# Patient Record
Sex: Female | Born: 1935 | Race: Black or African American | Hispanic: No | Smoking: Never smoker
Health system: Southern US, Community
[De-identification: ages and names within clinical notes are randomized; demographics above are authoritative.]

## PROBLEM LIST (undated history)

## (undated) DIAGNOSIS — Z87442 Personal history of urinary calculi: Secondary | ICD-10-CM

## (undated) DIAGNOSIS — I4891 Unspecified atrial fibrillation: Secondary | ICD-10-CM

## (undated) DIAGNOSIS — R011 Cardiac murmur, unspecified: Secondary | ICD-10-CM

## (undated) DIAGNOSIS — B019 Varicella without complication: Secondary | ICD-10-CM

## (undated) DIAGNOSIS — K7689 Other specified diseases of liver: Secondary | ICD-10-CM

## (undated) DIAGNOSIS — E669 Obesity, unspecified: Secondary | ICD-10-CM

## (undated) DIAGNOSIS — I2510 Atherosclerotic heart disease of native coronary artery without angina pectoris: Secondary | ICD-10-CM

## (undated) DIAGNOSIS — I10 Essential (primary) hypertension: Secondary | ICD-10-CM

## (undated) DIAGNOSIS — K5790 Diverticulosis of intestine, part unspecified, without perforation or abscess without bleeding: Secondary | ICD-10-CM

## (undated) DIAGNOSIS — G4733 Obstructive sleep apnea (adult) (pediatric): Secondary | ICD-10-CM

## (undated) DIAGNOSIS — Z860100 Personal history of colon polyps, unspecified: Secondary | ICD-10-CM

## (undated) DIAGNOSIS — N200 Calculus of kidney: Secondary | ICD-10-CM

## (undated) DIAGNOSIS — Z972 Presence of dental prosthetic device (complete) (partial): Secondary | ICD-10-CM

## (undated) DIAGNOSIS — D259 Leiomyoma of uterus, unspecified: Secondary | ICD-10-CM

## (undated) DIAGNOSIS — F1020 Alcohol dependence, uncomplicated: Secondary | ICD-10-CM

## (undated) DIAGNOSIS — N907 Vulvar cyst: Secondary | ICD-10-CM

## (undated) DIAGNOSIS — B029 Zoster without complications: Secondary | ICD-10-CM

## (undated) DIAGNOSIS — D219 Benign neoplasm of connective and other soft tissue, unspecified: Secondary | ICD-10-CM

## (undated) DIAGNOSIS — D649 Anemia, unspecified: Secondary | ICD-10-CM

## (undated) DIAGNOSIS — K429 Umbilical hernia without obstruction or gangrene: Secondary | ICD-10-CM

## (undated) DIAGNOSIS — M1990 Unspecified osteoarthritis, unspecified site: Secondary | ICD-10-CM

## (undated) DIAGNOSIS — N950 Postmenopausal bleeding: Secondary | ICD-10-CM

## (undated) DIAGNOSIS — R7301 Impaired fasting glucose: Secondary | ICD-10-CM

## (undated) DIAGNOSIS — N281 Cyst of kidney, acquired: Secondary | ICD-10-CM

## (undated) DIAGNOSIS — Z9989 Dependence on other enabling machines and devices: Secondary | ICD-10-CM

## (undated) DIAGNOSIS — K635 Polyp of colon: Secondary | ICD-10-CM

## (undated) DIAGNOSIS — E785 Hyperlipidemia, unspecified: Secondary | ICD-10-CM

## (undated) HISTORY — DX: Calculus of kidney: N20.0

## (undated) HISTORY — PX: CARDIAC CATHETERIZATION: SHX172

## (undated) HISTORY — DX: Diverticulosis of intestine, part unspecified, without perforation or abscess without bleeding: K57.90

## (undated) HISTORY — PX: OTHER SURGICAL HISTORY: SHX169

## (undated) HISTORY — DX: Atherosclerotic heart disease of native coronary artery without angina pectoris: I25.10

## (undated) HISTORY — DX: Zoster without complications: B02.9

## (undated) HISTORY — DX: Impaired fasting glucose: R73.01

## (undated) HISTORY — DX: Varicella without complication: B01.9

## (undated) HISTORY — DX: Benign neoplasm of connective and other soft tissue, unspecified: D21.9

## (undated) HISTORY — DX: Obstructive sleep apnea (adult) (pediatric): G47.33

## (undated) HISTORY — DX: Alcohol dependence, uncomplicated: F10.20

## (undated) HISTORY — DX: Unspecified osteoarthritis, unspecified site: M19.90

## (undated) HISTORY — DX: Unspecified atrial fibrillation: I48.91

## (undated) HISTORY — PX: APPENDECTOMY: SHX54

## (undated) HISTORY — DX: Polyp of colon: K63.5

## (undated) HISTORY — DX: Hyperlipidemia, unspecified: E78.5

## (undated) HISTORY — DX: Anemia, unspecified: D64.9

---

## 1985-06-25 HISTORY — PX: FOOT ARTHRODESIS: SHX1655

## 1985-06-25 HISTORY — PX: OTHER SURGICAL HISTORY: SHX169

## 1998-05-06 ENCOUNTER — Other Ambulatory Visit: Admission: RE | Admit: 1998-05-06 | Discharge: 1998-05-06 | Payer: Self-pay | Admitting: Cardiology

## 1999-05-22 ENCOUNTER — Other Ambulatory Visit: Admission: RE | Admit: 1999-05-22 | Discharge: 1999-05-22 | Payer: Self-pay | Admitting: Cardiology

## 2000-04-08 ENCOUNTER — Other Ambulatory Visit: Admission: RE | Admit: 2000-04-08 | Discharge: 2000-04-08 | Payer: Self-pay | Admitting: Internal Medicine

## 2001-10-28 ENCOUNTER — Encounter: Admission: RE | Admit: 2001-10-28 | Discharge: 2001-10-28 | Payer: Self-pay | Admitting: Internal Medicine

## 2001-10-28 ENCOUNTER — Encounter: Payer: Self-pay | Admitting: Internal Medicine

## 2002-11-09 ENCOUNTER — Encounter: Payer: Self-pay | Admitting: Internal Medicine

## 2002-11-09 ENCOUNTER — Encounter: Admission: RE | Admit: 2002-11-09 | Discharge: 2002-11-09 | Payer: Self-pay | Admitting: Internal Medicine

## 2004-01-03 ENCOUNTER — Encounter: Admission: RE | Admit: 2004-01-03 | Discharge: 2004-01-03 | Payer: Self-pay | Admitting: Internal Medicine

## 2005-02-15 ENCOUNTER — Encounter: Admission: RE | Admit: 2005-02-15 | Discharge: 2005-02-15 | Payer: Self-pay | Admitting: Internal Medicine

## 2005-09-23 HISTORY — PX: CARDIAC CATHETERIZATION: SHX172

## 2005-10-16 ENCOUNTER — Encounter: Payer: Self-pay | Admitting: Emergency Medicine

## 2005-10-16 ENCOUNTER — Ambulatory Visit (HOSPITAL_COMMUNITY): Admission: RE | Admit: 2005-10-16 | Discharge: 2005-10-18 | Payer: Self-pay | Admitting: Cardiovascular Disease

## 2006-12-30 ENCOUNTER — Ambulatory Visit: Payer: Self-pay | Admitting: Internal Medicine

## 2007-01-17 ENCOUNTER — Ambulatory Visit: Payer: Self-pay | Admitting: Internal Medicine

## 2007-01-17 ENCOUNTER — Encounter: Payer: Self-pay | Admitting: Internal Medicine

## 2007-06-05 ENCOUNTER — Encounter: Admission: RE | Admit: 2007-06-05 | Discharge: 2007-06-05 | Payer: Self-pay | Admitting: Internal Medicine

## 2008-06-11 ENCOUNTER — Encounter: Admission: RE | Admit: 2008-06-11 | Discharge: 2008-06-11 | Payer: Self-pay | Admitting: Internal Medicine

## 2010-11-10 NOTE — Cardiovascular Report (Signed)
NAMEARISSA, FAGIN                ACCOUNT NO.:  [id number]   MEDICAL RECORD NO.:  [id number]          PATIENT TYPE:  INP   LOCATION:  2919                         FACILITY:  [facility]   PHYSICIAN:  Nanetta Batty, M.D.   DATE OF BIRTH:  [birth year]   DATE OF PROCEDURE:  10/16/2005  DATE OF DISCHARGE:                              CARDIAC CATHETERIZATION   Ms. Biffle is a 75 year old moderately overweight African-American female with  history of hypertension, on atenolol and Maxzide.  She developed epigastric  pain at 1 o'clock this morning and was seen in [facility] emergency room,  where she was found to have nonspecific ST and T-wave changes and minimally  positive CPK-MB with negative troponins.  Her potassium was 2.4.  She was  transferred to [facility] for urgent catheterization to define her anatomy.  She  was treated with IV heparin, nitroglycerin and aspirin with resolution of  her symptoms.   PROCEDURE DESCRIPTION:  The patient was brought to the second floor [facility] cardiac catheterization lab in a postabsorptive state.  She was not  premedicated.  Her right groin was prepped and shaved in the usual sterile  fashion.  Xylocaine 1% was used for local anesthesia.  A 6 French sheath was  inserted into the right femoral artery using standard Seldinger technique.  A 6 French sheath was inserted into the right femoral vein.  The patient  received 20 mEq of potassium chloride intravenously through the venous  sheath as well as 20 mEq p.o. prior to injecting her coronary arteries.  Six  French right and left Judkins catheters as well as a [place] pigtail catheter  were used for selective coronary angiography, left ventriculography,  supravalvular aortography and distal abdominal aortography.  Visipaque dye  was used for the entirety of the case.  Retrograde aortic and ventricular  pressures were recorded.   HEMODYNAMICS:  1.  Aortic systolic pressure 105, diastolic pressure 64.  2.  Left  ventricular systolic pressure 110, end-diastolic pressure 19.   SELECTIVE CORONARY ANGIOGRAPHY:  1.  Left main:  Normal.  2.  LAD:  The LAD had 30-40% segmental midstenosis.  3.  Left circumflex:  This is a nondominant, serpiginous vessel without      significant disease.  4.  Right coronary artery:  This is a large, dominant vessel with minor      irregularities.   LEFT VENTRICULOGRAPHY:  RAO left ventriculogram was performed using a 10 mL  hand injection because of fear of causing tachyarrhythmias due to her  hypokalemia.  The EF appeared to be normal.   SUPRAVALVULAR AORTOGRAPHY:  Supravalvular aortogram was performed in the LAO  view using 20 mL of Visipaque dye at 20 mL/sec.  Arch vessels were intact.  There is no obvious dissection or aortic insufficiency.  The arch caliber  was normal.   DISTAL ABDOMINAL AORTOGRAPHY:  Distal abdominal aortogram was performed  using 20 mL of Visipaque dye at 20 mL/sec.  The renal arteries were widely  patent.  The infrarenal abdominal aorta at the iliac bifurcation appeared  free of significant atherosclerotic changes.  IMPRESSION:  Ms. Rhudy has noncritical coronary artery disease with normal  left ventricular function.  I believe her symptoms were related to  hypokalemia, which was repleted, probably secondary to her Maxzide therapy.  Will give her an additional 20 mEq this evening and recheck a BMP in the  morning.  We will also cycle her enzymes and  continue heparin.  The patient's ACT was measured at 216.  The sheaths will  be removed once the ACT falls below 200 and pressure will be held on the  groin to achieve hemostasis.  The patient left the lab in stable condition.  Dr. Laurey Morale office was notified.      Nanetta Batty, M.D.  Electronically Signed     JB/MEDQ  D:  10/16/2005  T:  10/17/2005  Job:  [id number]   cc:   Second Floor Cardiac Catheterization Lab   [facility]  [address]   [place], [place] [address]   Redge Gainer. Perini, M.D.  Fax: [phone]

## 2010-11-10 NOTE — Discharge Summary (Signed)
Kathryn Kent, Kathryn Kent                ACCOUNT NO.:  [id number]   MEDICAL RECORD NO.:  [id number]          PATIENT TYPE:  INP   LOCATION:  2017                         FACILITY:  [facility]   PHYSICIAN:  Nicki Guadalajara, M.D.     DATE OF BIRTH:  06-26-1935   DATE OF PROCEDURE:  DATE OF DISCHARGE:  10/18/2005                      STAT - MUST CHANGE TO CORRECT WORK TYPE   DISCHARGE DIAGNOSES:  1.  Chest pain, negative myocardial infarction.      1.  Nonobstructive coronary disease.  2.  Hypokalemia.  Initial potassium 2.4, resolved.  3.  Hypertension, controlled.  4.  Hyperglycemia with elevated glycohemoglobin.  5.  Obesity.  6.  Bradycardia with heart block with discontinuation of Atenolol now.  7.  Calcified fibroids.  8.  Possible calcified ovarian lesion.   DISCHARGE CONDITION:  Improved.   PROCEDURES:  On October 16, 2005, combined left heart catheterization by Dr.  Nanetta Batty with nonobstructive coronary disease.   DISCHARGE MEDICATIONS:  1.  Caduet 5/10 one daily.  2.  Do not take Atenolol or Maxzide.   DISCHARGE INSTRUCTIONS:  1.  Low-fat, low-salt diet.  2.  Increase activity slowly.  3.  Wash right groin catheterization site with soap and water.  Call if any      bleeding.  4.  Follow up with Dr. Waynard Edwards the first of next week.  Take pink sheet with      you to the office visit.  5.  Dr. Allyson Sabal did your heart catheterization.  If you need further      cardiology care, please see him back/call our office at [phone].   HISTORY OF PRESENT ILLNESS:  The patient is a 75 year old African-American  female, who presented to the emergency room at [facility] on October 16, 2005, with  substernal epigastric pain.  She initially went to the [facility] Emergency  Room.  No prior history of cardiac disease.  She was seen at [facility] ER.  An EKG showed T-wave inversions in V5 and 6.  Symptoms had improved, but the  pain recurred with bradycardia and more T-wave inversions in AVF and V4.  She was transferred to [facility] for cardiac catheterization emergently.   PAST MEDICAL HISTORY INCLUDES:  Hypertension.  Previously, she had not been  diabetic.   OUTPATIENT MEDICATIONS:  1.  Atenolol 50.  2.  Maxzide.  3.  Caduet.   ALLERGIES:  No known allergies.   SOCIAL HISTORY:  Widow, no children, nonsmoker.   FAMILY HISTORY:  See H&P.   REVIEW OF SYSTEMS:  See H&P.   PHYSICAL EXAMINATION AT DISCHARGE:  VITAL SIGNS:  Blood pressure 140/82,  pulse 68, respirations 18, temperature 97.7, oxygen saturation on room air  98%.  HEART:  Regular rate and rhythm.  LUNGS:  Clear.  ABDOMEN:  Soft, nontender.  Right groin catheterization site was stable  without hematoma.  The patient was also found to have normal LV function and normal renal  arteries on her cardiac catheterization.   X-RAYS:  Due to her initial nausea and vomiting, she had abdominal films,  which revealed normal bowel gas pattern,  no evidence for free peritoneal  air.  She did have pelvic calcifications, several, related to uterine  fibroids.  However, calcifications within the left pelvis were indeterminate  and may represent ovarian calcification.  Therefore, pelvic ultrasound was  recommended.   The patient underwent pelvic ultrasound, as well as transvaginal ultrasound,  which found innumerable calcified uterine masses, most consistent with  diffuse dystrophic fibroids.  She has a history of uterine fibroids.  Due to  the diffuse nature, evaluation of the uterus is somewhat limited and the  endometrial strip could not be evaluated.  She also had a left hemipelvic  calcified lesion, causing dense posterior acoustic shadowing, most likely  related to exophytic fibroid, especially given the description of the  ultrasound of May of 2004.  However, calcified ovarian lesion, such as a  dermoid, could also have this ultrasound appearance.  If further delineation  is desired, MRI would be beneficial.   Also, chest  x-ray on admission revealed no acute cardiopulmonary process.  Heart size was upper limits of normal.  Aorta was mildly tortuous.  No  pericardial fluid, no congestive failure, and lungs were clear.   LABORATORY RESULTS:  Initial hemoglobin 13.5, hematocrit 40.7, WBC 9.4,  platelets 225.  These remained stable.  She did drop a little with  hemoglobin to 11.2, hematocrit of 33.9, with heparin.  Coags on admission:  Pro-time 15.4, INR of 1.2, D-dimer of 0.45.  Heparin was started for chest  pain.   Chemistries:  Sodium 140.  Initial potassium was 2.4; with supplement went  quickly up to 3.1.  Chloride 106, glucose 170, BUN 12, creatinine 0.9, total  bilirubin 0.6, alkaline phosphatase 49, SGOT 32, SGPT 43, total protein 6.8,  albumin 3.6, calcium 9.1, lipase 23.  Hemoglobin A1C did come back at 6.6.  Magnesium 2.2.  Potassium was supplemented several times and, prior to  discharge, potassium was 4.1.   Cardiac markers:  CK's were elevated, 508, 517.  MB's very mildly elevated  with the index slightly up, 13.6 to 13.1 was the MB.  Troponin I's were all  negative at 0.01.  There was not felt to be a myocardial infarction.   The BNP was less than 30.  Total cholesterol 136, triglycerides 32, HDL 58,  LDL 72.   EKG:  Initially in sinus rhythm with marked sinus arrhythmia and ST-  depressions with T-wave inversions that continued.  By April 25, EKG with  sinus brady and nonspecific T-wave.   HOSPITAL COURSE:  Ms. Hinsley was admitted by Dr. Allyson Sabal with chest pain and EKG  changes.  Potassium was 2.4.  This was supplemented multiple times.  She was  taken to the cath lab, underwent cardiac catheterization, was found to have  normal renals, 30-40% LAD stenosis, 30% RCA, all non-obstructive.  By the  next morning, she felt much better.  Her nausea and vomiting had resolved.  Her hypokalemia was being given supplemental and was improving.  Her abdominal x-ray questioned ovarian lesions.  Pelvic  ultrasound was ordered  with results as previously stated.  We discontinued her heparin at that  point, as well as her Maxzide.  On October 18, 2005, she was stable without  further complaints.  Her glycohemoglobin is elevated and she was instructed  to cut back on sweets over the weekend and to see Dr. Waynard Edwards the first of  the week to manage possible diabetes.  Dr. Allyson Sabal did not feel that we needed  to follow her on a  continuous basis, but we would certainly be delighted to  see her back for any further cardiac issues or any further questions.  If,  in fact, she has diabetes, which is strongly suspected, then management of  diabetes, cholesterol and hypertension to prevent further coronary disease.      Darcella Gasman. Annie Paras, N.P.    ______________________________  Nicki Guadalajara, M.D.    LRI/MEDQ  D:  10/18/2005  T:  10/18/2005  Job:  [id number]   cc:   Loraine Leriche A. Perini, M.D.  Fax: [phone]   Nanetta Batty, M.D.  Fax: [phone]

## 2010-11-24 ENCOUNTER — Other Ambulatory Visit: Payer: Self-pay | Admitting: Internal Medicine

## 2010-11-24 DIAGNOSIS — Z1231 Encounter for screening mammogram for malignant neoplasm of breast: Secondary | ICD-10-CM

## 2010-11-28 ENCOUNTER — Ambulatory Visit
Admission: RE | Admit: 2010-11-28 | Discharge: 2010-11-28 | Disposition: A | Discharge status: Home / Self Care | Payer: Medicare Other | Source: Ambulatory Visit | Attending: Internal Medicine | Admitting: Internal Medicine

## 2010-11-28 DIAGNOSIS — Z1231 Encounter for screening mammogram for malignant neoplasm of breast: Secondary | ICD-10-CM

## 2011-10-28 ENCOUNTER — Emergency Department (HOSPITAL_COMMUNITY)
Admission: EM | Admit: 2011-10-28 | Discharge: 2011-10-28 | Disposition: A | Discharge status: Home / Self Care | Payer: Medicare Other | Attending: Emergency Medicine | Admitting: Emergency Medicine

## 2011-10-28 ENCOUNTER — Encounter (HOSPITAL_COMMUNITY): Payer: Self-pay | Admitting: Emergency Medicine

## 2011-10-28 DIAGNOSIS — R197 Diarrhea, unspecified: Secondary | ICD-10-CM | POA: Insufficient documentation

## 2011-10-28 DIAGNOSIS — R011 Cardiac murmur, unspecified: Secondary | ICD-10-CM | POA: Insufficient documentation

## 2011-10-28 DIAGNOSIS — I10 Essential (primary) hypertension: Secondary | ICD-10-CM | POA: Insufficient documentation

## 2011-10-28 DIAGNOSIS — K529 Noninfective gastroenteritis and colitis, unspecified: Secondary | ICD-10-CM

## 2011-10-28 DIAGNOSIS — K5289 Other specified noninfective gastroenteritis and colitis: Secondary | ICD-10-CM | POA: Insufficient documentation

## 2011-10-28 DIAGNOSIS — R1110 Vomiting, unspecified: Secondary | ICD-10-CM | POA: Insufficient documentation

## 2011-10-28 HISTORY — DX: Cardiac murmur, unspecified: R01.1

## 2011-10-28 HISTORY — DX: Essential (primary) hypertension: I10

## 2011-10-28 LAB — CBC
HCT: 40.2 % (ref 36.0–46.0)
Platelets: 221 10*3/uL (ref 150–400)
RDW: 13.4 % (ref 11.5–15.5)
WBC: 7 10*3/uL (ref 4.0–10.5)

## 2011-10-28 LAB — DIFFERENTIAL
Basophils Absolute: 0 10*3/uL (ref 0.0–0.1)
Lymphocytes Relative: 11 % — ABNORMAL LOW (ref 12–46)
Monocytes Absolute: 0.2 10*3/uL (ref 0.1–1.0)
Neutro Abs: 6.1 10*3/uL (ref 1.7–7.7)
Neutrophils Relative %: 87 % — ABNORMAL HIGH (ref 43–77)

## 2011-10-28 LAB — COMPREHENSIVE METABOLIC PANEL
ALT: 20 U/L (ref 0–35)
AST: 20 U/L (ref 0–37)
Albumin: 4.5 g/dL (ref 3.5–5.2)
Alkaline Phosphatase: 85 U/L (ref 39–117)
CO2: 22 mEq/L (ref 19–32)
Chloride: 102 mEq/L (ref 96–112)
GFR calc non Af Amer: 88 mL/min — ABNORMAL LOW (ref >90)
Potassium: 2.9 mEq/L — ABNORMAL LOW (ref 3.5–5.1)
Sodium: 140 mEq/L (ref 135–145)
Total Bilirubin: 0.5 mg/dL (ref 0.3–1.2)

## 2011-10-28 MED ORDER — ONDANSETRON 4 MG PO TBDP: 4.0000 mg | ORAL_TABLET | Freq: Three times a day (TID) | ORAL | Status: AC | PRN

## 2011-10-28 MED ORDER — SODIUM CHLORIDE 0.9 % IV SOLN
Freq: Once | INTRAVENOUS | Status: AC
  Administered 2011-10-28: 13:00:00 via INTRAVENOUS

## 2011-10-28 MED ORDER — ONDANSETRON HCL 4 MG/2ML IJ SOLN
4.0000 mg | Freq: Once | INTRAMUSCULAR | Status: AC
  Administered 2011-10-28: 4 mg via INTRAVENOUS
  Filled 2011-10-28: qty 2

## 2011-10-28 MED ORDER — KETOROLAC TROMETHAMINE 30 MG/ML IJ SOLN
30.0000 mg | Freq: Once | INTRAMUSCULAR | Status: AC
  Administered 2011-10-28: 30 mg via INTRAVENOUS
  Filled 2011-10-28: qty 1

## 2011-10-28 NOTE — ED Provider Notes (Signed)
History     CSN: [id number]  Arrival date & time 10/28/11  1150   First MD Initiated Contact with Patient 10/28/11 1221      Chief Complaint  Patient presents with  . Emesis    (Consider location/radiation/quality/duration/timing/severity/associated sxs/prior treatment) Patient is a 76 y.o. female presenting with vomiting. The history is provided by the patient.  Emesis  This is a new problem. Episode onset: last night. The problem occurs continuously. The problem has been rapidly worsening. The emesis has an appearance of stomach contents. There has been no fever. Associated symptoms include diarrhea. Pertinent negatives include no abdominal pain, no chills and no fever.    Past Medical History  Diagnosis Date  . Hypertension   . Heart murmur     History reviewed. No pertinent past surgical history.  History reviewed. No pertinent family history.  History  Substance Use Topics  . Smoking status: Never Smoker   . Smokeless tobacco: Not on file  . Alcohol Use: No    OB History    Grav Para Term Preterm Abortions TAB SAB Ect Mult Living                  Review of Systems  Constitutional: Negative for fever and chills.  Gastrointestinal: Positive for vomiting and diarrhea. Negative for abdominal pain.  All other systems reviewed and are negative.    Allergies  Review of patient's allergies indicates no known allergies.  Home Medications   Current Outpatient Rx  Name Route Sig Dispense Refill  . ASPIRIN EC 81 MG PO TBEC Oral Take 81 mg by mouth at bedtime.    Marland Kitchen OVER THE COUNTER MEDICATION Oral Take 1 tablet by mouth at bedtime. Vitamin c/d      BP 171/93  Pulse 97  Temp(Src) 98.3 F (36.8 C) (Oral)  Resp 18  SpO2 100%  Physical Exam  Nursing note and vitals reviewed. Constitutional: She is oriented to person, place, and time. She appears well-developed and well-nourished. No distress.  HENT:  Head: Normocephalic and atraumatic.  Neck: Normal range  of motion. Neck supple.  Cardiovascular: Normal rate and regular rhythm.  Exam reveals no gallop and no friction rub.   No murmur heard. Pulmonary/Chest: Effort normal and breath sounds normal. No respiratory distress. She has no wheezes.  Abdominal: Soft. Bowel sounds are normal. She exhibits no distension. There is no tenderness.  Musculoskeletal: Normal range of motion.  Neurological: She is alert and oriented to person, place, and time.  Skin: Skin is warm and dry. She is not diaphoretic.    ED Course  Procedures (including critical care time)   Labs Reviewed  CBC  DIFFERENTIAL  COMPREHENSIVE METABOLIC PANEL  LIPASE, BLOOD  URINALYSIS, ROUTINE W REFLEX MICROSCOPIC   No results found.   No diagnosis found.    MDM  The patient's presentation, exam, and workup are consistent with viral gastroenteritis.  She is feeling much better with fluids and medications.  Will discharge to home with zofran.        Geoffery Lyons, MD 10/28/11 1356

## 2011-10-28 NOTE — Discharge Instructions (Signed)
Viral Gastroenteritis Viral gastroenteritis is also known as stomach flu. This condition affects the stomach and intestinal tract. It can cause sudden diarrhea and vomiting. The illness typically lasts 3 to 8 days. Most people develop an immune response that eventually gets rid of the virus. While this natural response develops, the virus can make you quite ill. CAUSES  Many different viruses can cause gastroenteritis, such as rotavirus or noroviruses. You can catch one of these viruses by consuming contaminated food or water. You may also catch a virus by sharing utensils or other personal items with an infected person or by touching a contaminated surface. SYMPTOMS  The most common symptoms are diarrhea and vomiting. These problems can cause a severe loss of body fluids (dehydration) and a body salt (electrolyte) imbalance. Other symptoms may include:  Fever.   Headache.   Fatigue.   Abdominal pain.  DIAGNOSIS  Your caregiver can usually diagnose viral gastroenteritis based on your symptoms and a physical exam. A stool sample may also be taken to test for the presence of viruses or other infections. TREATMENT  This illness typically goes away on its own. Treatments are aimed at rehydration. The most serious cases of viral gastroenteritis involve vomiting so severely that you are not able to keep fluids down. In these cases, fluids must be given through an intravenous line (IV). HOME CARE INSTRUCTIONS   Drink enough fluids to keep your urine clear or pale yellow. Drink small amounts of fluids frequently and increase the amounts as tolerated.   Ask your caregiver for specific rehydration instructions.   Avoid:   Foods high in sugar.   Alcohol.   Carbonated drinks.   Tobacco.   Juice.   Caffeine drinks.   Extremely hot or cold fluids.   Fatty, greasy foods.   Too much intake of anything at one time.   Dairy products until 24 to 48 hours after diarrhea stops.   You may  consume probiotics. Probiotics are active cultures of beneficial bacteria. They may lessen the amount and number of diarrheal stools in adults. Probiotics can be found in yogurt with active cultures and in supplements.   Wash your hands well to avoid spreading the virus.   Only take over-the-counter or prescription medicines for pain, discomfort, or fever as directed by your caregiver. Do not give aspirin to children. Antidiarrheal medicines are not recommended.   Ask your caregiver if you should continue to take your regular prescribed and over-the-counter medicines.   Keep all follow-up appointments as directed by your caregiver.  SEEK IMMEDIATE MEDICAL CARE IF:   You are unable to keep fluids down.   You do not urinate at least once every 6 to 8 hours.   You develop shortness of breath.   You notice blood in your stool or vomit. This may look like coffee grounds.   You have abdominal pain that increases or is concentrated in one small area (localized).   You have persistent vomiting or diarrhea.   You have a fever.   The patient is a child younger than 3 months, and he or she has a fever.   The patient is a child older than 3 months, and he or she has a fever and persistent symptoms.   The patient is a child older than 3 months, and he or she has a fever and symptoms suddenly get worse.   The patient is a baby, and he or she has no tears when crying.  MAKE SURE YOU:     Understand these instructions.   Will watch your condition.   Will get help right away if you are not doing well or get worse.  Document Released: 06/11/2005 Document Revised: 05/31/2011 Document Reviewed: 03/28/2011 [employer] Patient Information 2012 [employer]. 

## 2011-10-28 NOTE — ED Notes (Signed)
Pt c/o vomiting and body aches starting last night

## 2012-11-28 ENCOUNTER — Encounter: Payer: Self-pay | Admitting: Internal Medicine

## 2012-12-22 ENCOUNTER — Encounter: Payer: Self-pay | Admitting: Internal Medicine

## 2012-12-22 ENCOUNTER — Ambulatory Visit (INDEPENDENT_AMBULATORY_CARE_PROVIDER_SITE_OTHER): Payer: Medicare Other | Admitting: Internal Medicine

## 2012-12-22 VITALS — BP 138/86 | HR 72 | Ht 64.5 in | Wt 199.2 lb

## 2012-12-22 DIAGNOSIS — K5732 Diverticulitis of large intestine without perforation or abscess without bleeding: Secondary | ICD-10-CM

## 2012-12-22 DIAGNOSIS — D126 Benign neoplasm of colon, unspecified: Secondary | ICD-10-CM

## 2012-12-22 DIAGNOSIS — Z1211 Encounter for screening for malignant neoplasm of colon: Secondary | ICD-10-CM

## 2012-12-22 DIAGNOSIS — K635 Polyp of colon: Secondary | ICD-10-CM

## 2012-12-22 NOTE — Progress Notes (Signed)
HISTORY OF PRESENT ILLNESS:  Kathryn Kent is a 77 y.o. female with multiple medical problems as listed below. She presents today, self-referred, with her friend, regarding the appropriateness of surveillance colonoscopy at this time. Patient has concerns with her current recall date of July 2008, because of a family history of cancers. The patient underwent her routine index screening colonoscopy in July of 2008. The examination was complete to the cecum with good preparation. She was found to have left-sided diverticulosis and a diminutive descending colon polyp which was hyperplastic. Followup in 10 years recommended. The patient does not have a family history of colon cancer. Her GI review of systems is entirely negative. Specifically, no change in bowel habits, abdominal pain, unexplained weight loss, or bleeding. Recent comprehensive evaluation with her primary provider occurred earlier this month. Of note, Hemoccult studies were negative.  REVIEW OF SYSTEMS:  All non-GI ROS negative except for sinus and allergy trouble, arthritis, back pain, cough, hearing problems, heart murmur, heart rhythm change, night sweats, sleeping problems  Past Medical History  Diagnosis Date  . Hypertension   . Heart murmur   . Colon polyps   . Diverticulosis   . Fibroid tumor   . OSA (obstructive sleep apnea)   . IFG (impaired fasting glucose)   . Osteoarthritis   . Shingles   . Chicken pox   . Alcoholism   . Anemia   . Atrial fibrillation     Past Surgical History  Procedure Laterality Date  . Appendectomy    . Cyst removed      abdomen  . Great toe surgery Bilateral 1987    Social History Kathryn Kent  reports that she has never smoked. She has never used smokeless tobacco. She reports that  drinks alcohol. She reports that she does not use illicit drugs.  family history includes Alcohol abuse in her mother; Aneurysm in her brother; Breast cancer in her sister; Lung cancer in her brother;  Prostate cancer in her father; and Stroke in her sister.  Allergies  Allergen Reactions  . Lipitor (Atorvastatin)     Stomach pains, heart attack sx's       PHYSICAL EXAMINATION: Vital signs: BP 138/86  Pulse 72  Ht 5' 4.5" (1.638 m)  Wt 199 lb 4 oz (90.379 kg)  BMI 33.69 kg/m2 General: Well-developed, well-nourished, no acute distress HEENT: Sclerae are anicteric, conjunctiva pink. Oral mucosa intact Lungs: Clear Heart: Regular Abdomen: soft, nontender, nondistended, no obvious ascites, no peritoneal signs, normal bowel sounds. No organomegaly. Extremities: No edema Psychiatric: alert and oriented x3. Cooperative   ASSESSMENT:  #1. Screening colonoscopy July 2008 with diverticulosis and diminutive hyperplastic polyp. No family history of colon cancer. Negative GI review of systems with Hemoccult negative stool   PLAN:  #1. I reviewed with her the current guidelines on colon cancer screening and appropriate followup intervals. Based on this, the appropriate time for followup would be around July 2018. Sooner if clinically indicated. She was grateful for the explanation and recommendation

## 2012-12-22 NOTE — Patient Instructions (Addendum)
Please follow up with Dr. Perry as needed 

## 2013-05-28 ENCOUNTER — Ambulatory Visit (INDEPENDENT_AMBULATORY_CARE_PROVIDER_SITE_OTHER): Payer: Medicare Other | Admitting: Cardiovascular Disease

## 2013-05-28 ENCOUNTER — Encounter: Payer: Self-pay | Admitting: Cardiovascular Disease

## 2013-05-28 VITALS — BP 140/84 | HR 63 | Ht 66.0 in | Wt 196.8 lb

## 2013-05-28 DIAGNOSIS — I10 Essential (primary) hypertension: Secondary | ICD-10-CM

## 2013-05-28 DIAGNOSIS — E785 Hyperlipidemia, unspecified: Secondary | ICD-10-CM

## 2013-05-28 DIAGNOSIS — I2510 Atherosclerotic heart disease of native coronary artery without angina pectoris: Secondary | ICD-10-CM | POA: Insufficient documentation

## 2013-05-28 NOTE — Assessment & Plan Note (Signed)
Status post cardiac catheterization by myself 10/16/05 revealing noncritical CAD with normal LV function. She denies chest pain or shortness of breath.

## 2013-05-28 NOTE — Patient Instructions (Signed)
Your physician wants you to follow-up in: 1 year with Dr Berry. You will receive a reminder letter in the mail two months in advance. If you don't receive a letter, please call our office to schedule the follow-up appointment.  

## 2013-05-28 NOTE — Progress Notes (Signed)
05/28/2013 Kathryn Kent   1935/09/03  [id number]  Primary Physician Ezequiel Kayser, MD Primary Cardiologist: Runell Gess MD [name]   HPI:  The patient is a delightful, 77 year old, mildly overweight, widowed [place] American female with no children who is a patient of Dr. Rodrigo Ran. I last saw her May 16, 2010. She has a history of noncritical CAD by cath October 16, 2005, with scattered 30% to 40% stenoses in the RCA and LAD. She denied chest pain or shortness of breath, but does get occasional palpitations. Her other problems include hypertension, hyperlipidemia and obstructive sleep apnea on CPAP which she benefits from. Dr. Waynard Edwards follows her lipid profile closely.since I saw her back in August 2013 she's been completely asymptomatic.    Current Outpatient Prescriptions  Medication Sig Dispense Refill  . Ascorbic Acid (VITAMIN C) 1000 MG tablet Take 1,000 mg by mouth daily.      . Cholecalciferol (VITAMIN D3) 2000 UNITS capsule Take 2,000 Units by mouth daily.      Marland Kitchen ezetimibe (ZETIA) 10 MG tablet Take 10 mg by mouth daily.      Marland Kitchen losartan (COZAAR) 50 MG tablet Take 50 mg by mouth daily.      . Multiple Vitamin (MULTIVITAMIN) tablet Take 1 tablet by mouth daily.      . simvastatin (ZOCOR) 40 MG tablet Take 40 mg by mouth every evening.       No current facility-administered medications for this visit.    Allergies  Allergen Reactions  . Lipitor [Atorvastatin]     Stomach pains, heart attack sx's    History   Social History  . Marital Status: Widowed    Spouse Name: N/A    Number of Children: 0  . Years of Education: N/A   Occupational History  . retired    Social History Main Topics  . Smoking status: Never Smoker   . Smokeless tobacco: Never Used  . Alcohol Use: Yes     Comment: rarely-once a year  . Drug Use: No  . Sexual Activity: Not on file   Other Topics Concern  . Not on file   Social History Narrative  . No narrative  on file     Review of Systems: General: negative for chills, fever, night sweats or weight changes.  Cardiovascular: negative for chest pain, dyspnea on exertion, edema, orthopnea, palpitations, paroxysmal nocturnal dyspnea or shortness of breath Dermatological: negative for rash Respiratory: negative for cough or wheezing Urologic: negative for hematuria Abdominal: negative for nausea, vomiting, diarrhea, bright red blood per rectum, melena, or hematemesis Neurologic: negative for visual changes, syncope, or dizziness All other systems reviewed and are otherwise negative except as noted above.    Blood pressure 140/84, pulse 63, height 5\' 6"  (1.676 m), weight 196 lb 12.8 oz (89.268 kg).  General appearance: alert and no distress Neck: no adenopathy, no carotid bruit, no JVD, supple, symmetrical, trachea midline and thyroid not enlarged, symmetric, no tenderness/mass/nodules Lungs: clear to auscultation bilaterally Heart: regular rate and rhythm, S1, S2 normal, no murmur, click, rub or gallop Extremities: extremities normal, atraumatic, no cyanosis or edema  EKG normal sinus rhythm at 63 with inferolateral T wave inversion unchanged from prior EKGs  ASSESSMENT AND PLAN:   Coronary artery disease Status post cardiac catheterization by myself 10/16/05 revealing noncritical CAD with normal LV function. She denies chest pain or shortness of breath.  Essential hypertension Well-controlled on current medications  Hyperlipidemia On statin therapy followed by her  PCP      Runell Gess MD FACP,FACC,FAHA, [facility] 05/28/2013 10:05 AM

## 2013-05-28 NOTE — Assessment & Plan Note (Signed)
Well-controlled on current medications 

## 2013-05-28 NOTE — Assessment & Plan Note (Signed)
On statin therapy followed by her PCP 

## 2013-05-29 ENCOUNTER — Encounter: Payer: Self-pay | Admitting: Cardiovascular Disease

## 2013-07-13 ENCOUNTER — Other Ambulatory Visit: Payer: Self-pay

## 2013-07-13 DIAGNOSIS — Z1231 Encounter for screening mammogram for malignant neoplasm of breast: Secondary | ICD-10-CM

## 2013-08-03 ENCOUNTER — Ambulatory Visit: Payer: Medicare Other

## 2013-08-19 ENCOUNTER — Ambulatory Visit
Admission: RE | Admit: 2013-08-19 | Discharge: 2013-08-19 | Disposition: A | Discharge status: Home / Self Care | Payer: Self-pay | Source: Ambulatory Visit

## 2013-08-19 DIAGNOSIS — Z1231 Encounter for screening mammogram for malignant neoplasm of breast: Secondary | ICD-10-CM

## 2014-02-08 ENCOUNTER — Emergency Department (HOSPITAL_COMMUNITY): Payer: Medicare Other

## 2014-02-08 ENCOUNTER — Emergency Department (HOSPITAL_COMMUNITY)
Admission: EM | Admit: 2014-02-08 | Discharge: 2014-02-08 | Disposition: A | Discharge status: Home / Self Care | Payer: Medicare Other | Attending: Emergency Medicine | Admitting: Emergency Medicine

## 2014-02-08 ENCOUNTER — Encounter (HOSPITAL_COMMUNITY): Payer: Self-pay | Admitting: Emergency Medicine

## 2014-02-08 DIAGNOSIS — Z8619 Personal history of other infectious and parasitic diseases: Secondary | ICD-10-CM | POA: Insufficient documentation

## 2014-02-08 DIAGNOSIS — I2510 Atherosclerotic heart disease of native coronary artery without angina pectoris: Secondary | ICD-10-CM | POA: Insufficient documentation

## 2014-02-08 DIAGNOSIS — Z79899 Other long term (current) drug therapy: Secondary | ICD-10-CM | POA: Insufficient documentation

## 2014-02-08 DIAGNOSIS — Z8742 Personal history of other diseases of the female genital tract: Secondary | ICD-10-CM | POA: Diagnosis not present

## 2014-02-08 DIAGNOSIS — M542 Cervicalgia: Secondary | ICD-10-CM | POA: Diagnosis present

## 2014-02-08 DIAGNOSIS — R011 Cardiac murmur, unspecified: Secondary | ICD-10-CM | POA: Diagnosis not present

## 2014-02-08 DIAGNOSIS — E785 Hyperlipidemia, unspecified: Secondary | ICD-10-CM | POA: Diagnosis not present

## 2014-02-08 DIAGNOSIS — M47812 Spondylosis without myelopathy or radiculopathy, cervical region: Secondary | ICD-10-CM | POA: Diagnosis not present

## 2014-02-08 DIAGNOSIS — M1990 Unspecified osteoarthritis, unspecified site: Secondary | ICD-10-CM | POA: Insufficient documentation

## 2014-02-08 DIAGNOSIS — Z860100 Personal history of colon polyps, unspecified: Secondary | ICD-10-CM | POA: Insufficient documentation

## 2014-02-08 DIAGNOSIS — I10 Essential (primary) hypertension: Secondary | ICD-10-CM | POA: Insufficient documentation

## 2014-02-08 DIAGNOSIS — F1021 Alcohol dependence, in remission: Secondary | ICD-10-CM | POA: Insufficient documentation

## 2014-02-08 DIAGNOSIS — Z862 Personal history of diseases of the blood and blood-forming organs and certain disorders involving the immune mechanism: Secondary | ICD-10-CM | POA: Insufficient documentation

## 2014-02-08 MED ORDER — CYCLOBENZAPRINE HCL 10 MG PO TABS: 10.0000 mg | ORAL_TABLET | Freq: Two times a day (BID) | ORAL | Status: DC | PRN

## 2014-02-08 NOTE — Discharge Instructions (Signed)
Take 400 mg of ibuprofen 3-4 times a day initially.  He can increase to 600 mg 3-4 times a day as needed.   Headache and Arthritis Headaches and arthritis are common problems. This causes an interest in the possible role of arthritis in causing headaches. Several major forms of arthritis exist. Two of the most common types are:  Rheumatoid arthritis.  Osteoarthritis. Rheumatoid arthritis may begin at any age. It is a condition in which the body attacks some of its own tissues, thinking they do not belong. This leads to destruction of the bony areas around the joints. This condition may afflict any of the body's joints. It usually produces a deformity of the joint. The hands and fingers no longer appear straight but often appear angled towards one side. In some cases, the spine may be involved. Most often it is the vertebrae of the neck (cervical spine). The areas of the neck most commonly afflicted by rheumatoid arthritis are the first and second cervical vertebrae. Curiously, rheumatoid arthritis, though it often produces severe deformities, is not always painful.  The more common form of arthritis is osteoarthritis. It is a wear-and-tear form of arthritis. It usually does not produce deformity of the joints or destruction of the bony tissues. Rather the ligaments weaken. They may be calcified due to the body's attempt to heal the damage. The larger joints of the body and those joints that take the most stress and strain are the most often affected. In the neck region this osteoarthritis usually involves the fifth, sixth and seventh vertebrae. This is because the effects of posture produce the most fatigue on them. Osteoarthritis is often more painful than rheumatoid arthritis.  During workups for arthritis, a test evaluating inflammation, (the sedimentation rate) often is performed. In rheumatoid arthritis, this test will usually be elevated. Other tests for inflammation may also be elevated. In  patients with osteoarthritis, x-rays of the neck or jaw joints will show changes from "lipping" of the vertebrae. This is caused by calcium deposits in the ligaments. Or they may show narrowing of the space between the vertebrae, or spur formation (from calcium deposits). If severe, it may cause obstruction of the holes where the nerves pass from the spine to the body. In rheumatoid arthritis, dislocation of vertebrae may occur in the upper neck. CT scan and MRI in patients with osteoarthritis may show bulging of the discs that cushion the vertebrae. In the most severe cases, herniation of the discs may occur.  Headaches, felt as a pain in the neck, may be caused by arthritis if the first, second or third vertebrae are involved. This condition is due to the nerves that supply the scalp only originating from this area of the spine. Neck pain itself, whether alone or coupled with headaches, can involve any portion of the neck. If the jaw is involved, the symptoms are similar to those of Temporomandibular Joint Syndrome (TMJ).  The progressive severity of rheumatoid arthritis may be slowed by a variety of potent medications. In osteoarthritis, its progression is not usually hindered by medication. The following may be helpful in slowing the advancement of the disorder:  Lifestyle adjustment.  Exercise.  Rest.  Weight loss. Medications, such as the nonsteroidal anti-inflammatory agents (NSAIDs), are useful. They may reduce the pain and improve the reduced motion which occurs in joints afflicted by arthritis. From some studies, the use of acetaminophen appears to be as effective in controlling the pain of arthritis as the NSAIDs. Physical modalities may also  be useful for arthritis. They include:  Heat.  Massage.  Exercise. But physical therapy must be prescribed by a caregiver, just as most medications for arthritis.  Document Released: 09/01/2003 Document Revised: 06/16/2013 Document Reviewed:  09/14/2013 [facility] Patient Information 2015 [place], [place]. This information is not intended to replace advice given to you by your health care provider. Make sure you discuss any questions you have with your health care provider.

## 2014-02-08 NOTE — ED Provider Notes (Signed)
CSN: [id number]     Arrival date & time 02/08/14  0900 History   First MD Initiated Contact with Patient 02/08/14 0913     Chief Complaint  Patient presents with  . Neck Pain      HPI Patient presents with chief complaint of cervical pain which started Thursday morning.  No history of injury.  Pain initially went away but then came back.  He feels much better now.  Patient has had no radicular distribution of pain.  No weakness or other neurological symptoms. Past Medical History  Diagnosis Date  . Hypertension   . Heart murmur   . Colon polyps   . Diverticulosis   . Fibroid tumor   . OSA (obstructive sleep apnea)   . IFG (impaired fasting glucose)   . Osteoarthritis   . Shingles   . Chicken pox   . Alcoholism   . Anemia   . Atrial fibrillation   . Hyperlipidemia   . Coronary artery disease     status post cardiac catheterization performed 10/16/05 revealing noncritical CAD   Past Surgical History  Procedure Laterality Date  . Appendectomy    . Cyst removed      abdomen  . Great toe surgery Bilateral 1987   Family History  Problem Relation Age of Onset  . Prostate cancer Father     ?  Marland Kitchen Alcohol abuse Mother   . Aneurysm Brother     heart  . Stroke Sister   . Breast cancer Sister   . Lung cancer Brother    History  Substance Use Topics  . Smoking status: Never Smoker   . Smokeless tobacco: Never Used  . Alcohol Use: Yes     Comment: rarely-once a year   OB History   Grav Para Term Preterm Abortions TAB SAB Ect Mult Living                 Review of Systems  All other systems reviewed and are negative   Allergies  Lipitor  Home Medications   Prior to Admission medications   Medication Sig Start Date End Date Taking? Authorizing Provider  acetaminophen (TYLENOL) 500 MG tablet Take 1,000 mg by mouth every 6 (six) hours as needed for mild pain.   Yes Historical Provider, MD  amLODipine (NORVASC) 10 MG tablet Take 10 mg by mouth daily.   Yes Historical  Provider, MD  Ascorbic Acid (VITAMIN C) 1000 MG tablet Take 1,000 mg by mouth daily.   Yes Historical Provider, MD  Cholecalciferol (VITAMIN D3) 2000 UNITS capsule Take 2,000 Units by mouth daily.   Yes Historical Provider, MD  losartan (COZAAR) 50 MG tablet Take 50 mg by mouth daily.   Yes Historical Provider, MD  Multiple Vitamin (MULTIVITAMIN) tablet Take 1 tablet by mouth daily.   Yes Historical Provider, MD  simvastatin (ZOCOR) 40 MG tablet Take 40 mg by mouth every evening.   Yes Historical Provider, MD  cyclobenzaprine (FLEXERIL) 10 MG tablet Take 1 tablet (10 mg total) by mouth 2 (two) times daily as needed for muscle spasms. 02/08/14   Dot Lanes, MD   BP 164/99  Pulse 80  Resp 20  SpO2 99% Physical Exam  Neck:     Physical Exam  Nursing note and vitals reviewed. Constitutional: She is oriented to person, place, and time. She appears well-developed and well-nourished. No distress.  HENT:  Head: Normocephalic and atraumatic.  Eyes: Pupils are equal, round, and reactive to light.  Neck: Normal  range of motion.  Cardiovascular: Normal rate and intact distal pulses.   Pulmonary/Chest: No respiratory distress.  Abdominal: Normal appearance. She exhibits no distension.  Musculoskeletal: Normal range of motion.  Neurological: She is alert and oriented to person, place, and time. No cranial nerve deficit.  Skin: Skin is warm and dry. No rash noted.  Psychiatric: She has a normal mood and affect. Her behavior is normal.   ED Course  Procedures (including critical care time) Labs Review Labs Reviewed - No data to display  Imaging Review Dg Cervical Spine Complete  02/08/2014   CLINICAL DATA:  Left-sided neck pain.  EXAM: CERVICAL SPINE  4+ VIEWS  COMPARISON:  None.  FINDINGS: The cervical spine is visualized from the occiput to the cervicothoracic junction. There is slight straightening of the normal cervical lordosis without subluxation or fracture. Endplate degenerative  changes and loss of disc space height are seen from C3-4 to C7-T1. Associated uncovertebral hypertrophy and facet sclerosis. Prevertebral soft tissues are within normal limits. Neural foraminal narrowing is seen bilaterally at C4-5. The dens is obscured on the dedicated views.  IMPRESSION: Slight straightening of the normal cervical lordosis with multilevel spondylosis.   Electronically Signed   By: Lorin Picket M.D.   On: 02/08/2014 10:22     EKG Interpretation None      MDM   Final diagnoses:  Cervical arthritis        Dot Lanes, MD 02/08/14 1037

## 2014-02-08 NOTE — ED Notes (Addendum)
Pt c/o neck pain since Thursday, states she woke up like that, denies injury. Pt states she does not feel pain right now but last night she couldn't lay on a pillow.

## 2014-07-27 ENCOUNTER — Other Ambulatory Visit: Payer: Self-pay

## 2014-07-27 DIAGNOSIS — Z1231 Encounter for screening mammogram for malignant neoplasm of breast: Secondary | ICD-10-CM

## 2014-08-05 ENCOUNTER — Telehealth: Payer: Self-pay | Admitting: Obstetrics & Gynecology

## 2014-08-05 NOTE — Telephone Encounter (Signed)
Pt of dr Sabra Heck - last [facility] 10/11/11. States she needs a pessary "i haven't had one in a couple of years" Set up for aex in march 2017.  Chart to tracy

## 2014-08-05 NOTE — Telephone Encounter (Signed)
Left message to call Jasmine Maceachern at [phone]. 

## 2014-08-06 NOTE — Telephone Encounter (Signed)
Spoke with patient. Patient states that she needs to come in to discuss her pessary with Dr.Miller. Patient denies any problems or symptoms. "I just know that she told me to come in every two years to see her to talk about my pessary and my primary told me it was time to go see her." Appointment scheduled for September 03 2014 at 10am with Dr.Miller. Patient is agreeable to date and time.  Routing to provider for final review. Patient agreeable to disposition. Will close encounter

## 2014-08-30 ENCOUNTER — Ambulatory Visit
Admission: RE | Admit: 2014-08-30 | Discharge: 2014-08-30 | Disposition: A | Discharge status: Home / Self Care | Payer: Medicare Other | Source: Ambulatory Visit

## 2014-08-30 DIAGNOSIS — Z1231 Encounter for screening mammogram for malignant neoplasm of breast: Secondary | ICD-10-CM

## 2014-08-31 ENCOUNTER — Ambulatory Visit: Payer: Self-pay

## 2014-09-03 ENCOUNTER — Encounter: Payer: Self-pay | Admitting: Obstetrics & Gynecology

## 2014-09-03 ENCOUNTER — Ambulatory Visit (INDEPENDENT_AMBULATORY_CARE_PROVIDER_SITE_OTHER): Payer: Medicare Other | Admitting: Obstetrics & Gynecology

## 2014-09-03 VITALS — BP 140/78 | HR 68 | Resp 16 | Wt 206.8 lb

## 2014-09-03 DIAGNOSIS — L723 Sebaceous cyst: Secondary | ICD-10-CM | POA: Diagnosis not present

## 2014-09-03 DIAGNOSIS — N907 Vulvar cyst: Secondary | ICD-10-CM

## 2014-09-03 DIAGNOSIS — D251 Intramural leiomyoma of uterus: Secondary | ICD-10-CM | POA: Diagnosis not present

## 2014-09-12 ENCOUNTER — Encounter: Payer: Self-pay | Admitting: Obstetrics & Gynecology

## 2014-09-12 DIAGNOSIS — D251 Intramural leiomyoma of uterus: Secondary | ICD-10-CM | POA: Insufficient documentation

## 2014-09-12 DIAGNOSIS — N907 Vulvar cyst: Secondary | ICD-10-CM | POA: Insufficient documentation

## 2014-09-12 NOTE — Progress Notes (Signed)
Subjective:     Patient ID: Kathryn Kent, female   DOB: [birth year], 79 y.o.   MRN: [id number]  HPI 79 y.o. Widowed Black female here for pelvic exam due to hx of enlarged uterus due to multiple calcified fibroids.  Pt continues to see Dr. Joylene Draft.  Doing well.  No vaginal bleeding.  No vaginal discharge.  Pt has not been symptomatic with her fibroids due to the size, which continues to surprise me but not the patient.  Pt has undergone at least two ultrasounds showing enlarged uterus, multiple calcified fibroids but no other findings.    Review of Systems  All other systems reviewed and are negative.      Objective:   Physical Exam  Constitutional: She appears well-developed and well-nourished.  Abdominal: Soft. Bowel sounds are normal. She exhibits no distension. There is no tenderness. There is no rebound and no guarding.  Genitourinary: Vagina normal. There is no rash, tenderness, lesion or injury on the right labia. There is lesion (1cm sebaceous cyst in left labium majora) on the left labia. There is no rash, tenderness or injury on the left labia. Uterus is enlarged (and goblular, about 12 weeks size, firm to palpation). Cervix exhibits no motion tenderness. Right adnexum displays no mass, no tenderness and no fullness. Left adnexum displays no mass, no tenderness and no fullness.  Lymphadenopathy:       Right: No inguinal adenopathy present.       Left: No inguinal adenopathy present.  No Pap obtained.     Assessment:     Enlarged uterus with calcified fibroids, no change over last two years No PMP bleeding  Left labial sebaceous cyst, stable    Plan:     Continue to recommend pt just be followed conservatively.  This is certainly not a sarcoma as she would not still be alive.  She just has a firm uterus due to the fibroids.  Pt is welcome to continue to see me every two or three years. She does know to call if she every has any PMP bleeding.

## 2015-03-18 ENCOUNTER — Ambulatory Visit (INDEPENDENT_AMBULATORY_CARE_PROVIDER_SITE_OTHER): Payer: Medicare Other | Admitting: Cardiovascular Disease

## 2015-03-18 ENCOUNTER — Encounter: Payer: Self-pay | Admitting: Cardiovascular Disease

## 2015-03-18 ENCOUNTER — Other Ambulatory Visit: Payer: Self-pay

## 2015-03-18 VITALS — BP 140/86 | HR 79 | Ht 66.0 in

## 2015-03-18 DIAGNOSIS — I2583 Coronary atherosclerosis due to lipid rich plaque: Principal | ICD-10-CM

## 2015-03-18 DIAGNOSIS — I10 Essential (primary) hypertension: Secondary | ICD-10-CM | POA: Diagnosis not present

## 2015-03-18 DIAGNOSIS — E785 Hyperlipidemia, unspecified: Secondary | ICD-10-CM | POA: Diagnosis not present

## 2015-03-18 DIAGNOSIS — I2510 Atherosclerotic heart disease of native coronary artery without angina pectoris: Secondary | ICD-10-CM | POA: Diagnosis not present

## 2015-03-18 NOTE — Assessment & Plan Note (Signed)
History of hypertension with blood pressure measured at 140/86. She is on amlodipine and losartan. Continue current meds at current dosing

## 2015-03-18 NOTE — Assessment & Plan Note (Signed)
History of hyperlipidemia on some statin 40 mg a day followed by her PCP

## 2015-03-18 NOTE — Assessment & Plan Note (Signed)
History of CAD status post chronic catheterization 10/16/05 revealing scattered 30-40% stenoses in the LAD and RCA. She denies chest pain or shortness of breath.

## 2015-03-18 NOTE — Patient Instructions (Signed)
Medication Instructions:  Your physician recommends that you continue on your current medications as directed. Please refer to the Current Medication list given to you today.   Labwork: I will get your lab work from your Primary Care Physician.   Testing/Procedures: NONE  Follow-Up: Your physician wants you to follow-up in: 12 months with Dr. Gwenlyn Found. You will receive a reminder letter in the mail two months in advance. If you don't receive a letter, please call our office to schedule the follow-up appointment.   Any Other Special Instructions Will Be Listed Below (If Applicable).

## 2015-03-18 NOTE — Progress Notes (Signed)
03/18/2015 Kathryn Kent   [birth year]  [id number]  Primary Physician Jerlyn Ly, MD Primary Cardiologist: Lorretta Harp MD [name]   HPI:  The patient is a delightful, 79 year old, mildly overweight, widowed [place] American female with no children who is a patient of Dr. Crist Infante. I last saw her May 16, 2010. She has a history of noncritical CAD by cath October 16, 2005, with scattered 30% to 40% stenoses in the RCA and LAD. She denied chest pain or shortness of breath, but does get occasional palpitations. Her other problems include hypertension, hyperlipidemia and obstructive sleep apnea on CPAP which she benefits from. Dr. Joylene Draft follows her lipid profile closely.since I saw her last 05/28/13 she's been completely asymptomatic   Current Outpatient Prescriptions  Medication Sig Dispense Refill  . acetaminophen (TYLENOL) 500 MG tablet Take 1,000 mg by mouth every 6 (six) hours as needed for mild pain.    Marland Kitchen amLODipine (NORVASC) 10 MG tablet Take 10 mg by mouth daily.    . Ascorbic Acid (VITAMIN C) 1000 MG tablet Take 500 mg by mouth daily.     . Cholecalciferol (VITAMIN D3) 2000 UNITS capsule Take 2,000 Units by mouth daily.    . Garlic 5784 MG CAPS Take 1,000 mg by mouth daily.    Marland Kitchen losartan (COZAAR) 100 MG tablet Take 100 mg by mouth daily.  4  . losartan (COZAAR) 50 MG tablet Take 50 mg by mouth daily.    . Multiple Vitamin (MULTIVITAMIN) tablet Take 1 tablet by mouth daily.    . simvastatin (ZOCOR) 40 MG tablet Take 40 mg by mouth every evening.     No current facility-administered medications for this visit.    Allergies  Allergen Reactions  . Lipitor [Atorvastatin]     Stomach pains, heart attack sx's    Social History   Social History  . Marital Status: Widowed    Spouse Name: N/A  . Number of Children: 0  . Years of Education: N/A   Occupational History  . retired    Social History Main Topics  . Smoking status: Never Smoker   .  Smokeless tobacco: Never Used  . Alcohol Use: Yes     Comment: rarely-once a year  . Drug Use: No  . Sexual Activity: Not on file   Other Topics Concern  . Not on file   Social History Narrative     Review of Systems: General: negative for chills, fever, night sweats or weight changes.  Cardiovascular: negative for chest pain, dyspnea on exertion, edema, orthopnea, palpitations, paroxysmal nocturnal dyspnea or shortness of breath Dermatological: negative for rash Respiratory: negative for cough or wheezing Urologic: negative for hematuria Abdominal: negative for nausea, vomiting, diarrhea, bright red blood per rectum, melena, or hematemesis Neurologic: negative for visual changes, syncope, or dizziness All other systems reviewed and are otherwise negative except as noted above.    Blood pressure 140/86, pulse 79, height 5\' 6"  (1.676 m).  General appearance: alert and no distress Neck: no adenopathy, no carotid bruit, no JVD, supple, symmetrical, trachea midline and thyroid not enlarged, symmetric, no tenderness/mass/nodules Lungs: clear to auscultation bilaterally Heart: regular rate and rhythm, S1, S2 normal, no murmur, click, rub or gallop Extremities: extremities normal, atraumatic, no cyanosis or edema  EKG normal sinus rhythm at 79 with inferolateral T-wave inversion unchanged from prior EKGs.  ASSESSMENT AND PLAN:   Hyperlipidemia History of hyperlipidemia on some statin 40 mg a day followed by her PCP  Essential hypertension History of hypertension with blood pressure measured at 140/86. She is on amlodipine and losartan. Continue current meds at current dosing  Coronary artery disease History of CAD status post chronic catheterization 10/16/05 revealing scattered 30-40% stenoses in the LAD and RCA. She denies chest pain or shortness of breath.      Lorretta Harp MD FACP,FACC,FAHA, [facility] 03/18/2015 11:14 AM

## 2015-08-26 ENCOUNTER — Telehealth: Payer: Self-pay | Admitting: Emergency Medicine

## 2015-08-26 NOTE — Telephone Encounter (Signed)
Patient calling to office with c/o of vaginal bleeding that has occurred twice. Patient states that she noticed spotting on her underwear on Saturday 08/20/15 and today. Patient denies any complaints, no bowel or bladder changes, no abdominal pain, no fevers, no nausea or vomiting. She states "everything is just like normal, it just happened two times and I know I was supposed to call when this happened." Patient confirms that she is spotting only small amounts and sees it only on her underwear. Does not notice bleeding after urination or with bowel movement.   Patient agreeable to appointment with Dr. Sabra Heck. She states she continues to work and is only available on Mondays or Wednesdays. Offered first available with Dr. Sabra Heck or one of Dr. Ammie Ferrier partners and she declines all appointments that are offered. She accepts appointment to see Dr. Sabra Heck on 09/05/15 for office visit only. She declines to plan for any further evaluation at this time other than office visit.  Advised patient to return call to our office any time if she develops abdominal pain, fevers, increase in bleeding or any other concerns.  Advised I will send a message to Dr. Sabra Heck and will call her back with any additional instructions from Dr. Sabra Heck. Patient is agreeable.

## 2015-08-26 NOTE — Telephone Encounter (Signed)
Agreeable to plan of care.  Thank you.

## 2015-08-26 NOTE — Telephone Encounter (Signed)
Routing to Dr Miller for review.  

## 2015-08-29 NOTE — Telephone Encounter (Signed)
Noted message from Dr. Miller. Will close encounter.   

## 2015-09-05 ENCOUNTER — Encounter: Payer: Self-pay | Admitting: Obstetrics & Gynecology

## 2015-09-05 ENCOUNTER — Ambulatory Visit (INDEPENDENT_AMBULATORY_CARE_PROVIDER_SITE_OTHER): Payer: Medicare Other | Admitting: Obstetrics & Gynecology

## 2015-09-05 VITALS — BP 130/70 | HR 96 | Resp 18 | Ht 66.0 in | Wt 202.0 lb

## 2015-09-05 DIAGNOSIS — N950 Postmenopausal bleeding: Secondary | ICD-10-CM | POA: Diagnosis not present

## 2015-09-05 DIAGNOSIS — Z124 Encounter for screening for malignant neoplasm of cervix: Secondary | ICD-10-CM | POA: Diagnosis not present

## 2015-09-05 NOTE — Progress Notes (Signed)
Subjective:     Patient ID: Kathryn Kent, female   DOB: [birth year], 80 y.o.   MRN: [id number]  HPI 80 yo G1A1 Widowed AA female who presents with episode of BRVB on 08/20/15 and then again on 08/26/15.  Evaluation was recommended.  Pt denies cramping.  She has seen some old looking blood/discharge since the last episode of BR bleeding.  Known hx of uterine fibroids.   Last PUS 2007 showing 11.6 x 7.1 x 8.6cm uterus with multiple fibroids.  Denies pelvic or back pain.  Denies any urinary or new GI symptoms.   Pt states she wants a hysterectomy and "should have had one years ago".  D/W pt evaluation is appropriate first and then will decide what to do, if anything, after evaluation is complete.  She is comfortable with this plan.  Review of Systems  All other systems reviewed and are negative.      Objective:   Physical Exam  Constitutional: She is oriented to person, place, and time. She appears well-developed and well-nourished.  Abdominal: Soft. Bowel sounds are normal.  Genitourinary: There is no rash, tenderness, lesion or injury on the right labia. There is no rash, tenderness, lesion or injury on the left labia. Uterus is enlarged (about 8-10 weeks size and globular c/w fibroids). Cervix exhibits no motion tenderness. Right adnexum displays no mass, no tenderness and no fullness. Left adnexum displays no mass, no tenderness and no fullness. There is bleeding (appears old and dark at cervical os) in the vagina.  Lymphadenopathy:       Right: No inguinal adenopathy present.       Left: No inguinal adenopathy present.  Neurological: She is alert and oriented to person, place, and time.  Skin: Skin is warm and dry.  Psychiatric: She has a normal mood and affect.   Endometrial biopsy recommended.  Discussed with patient.  Verbal and written consent obtained.   Procedure:  Speculum placed.  Cervix visualized and cleansed with betadine prep.  A single toothed tenaculum was applied to the  anterior lip of the cervix.  Endometrial pipelle was advanced through the cervix into the endometrial cavity without difficulty.  Pipelle passed to 7cm.  Suction applied and pipelle removed with some blood and clot but not a lot of tissue.  Three passes were performed.  Tenculum removed.  No bleeding noted.  Patient tolerated procedure well.      Assessment:     PMP bleeding Known hx of uterine fibroids     Plan:     Pap smear pending Endometrial biopsy obtained today Will plan PUS after biopsy is back to recheck fibroids

## 2015-09-06 LAB — IPS PAP SMEAR ONLY

## 2015-09-15 ENCOUNTER — Telehealth: Payer: Self-pay | Admitting: Emergency Medicine

## 2015-09-15 DIAGNOSIS — N950 Postmenopausal bleeding: Secondary | ICD-10-CM

## 2015-09-15 NOTE — Telephone Encounter (Signed)
Message left to return call to Kathryn Kent at [phone].    

## 2015-09-15 NOTE — Telephone Encounter (Signed)
-----   Message from Megan Salon, MD sent at 09/08/2015  2:12 PM EDT ----- Please let pt know her endometrial biopsy is negative.  Pap was negative.  She needs a PUS now.  Ok to schedule.  Episode of PMP bleeding.

## 2015-09-16 NOTE — Telephone Encounter (Signed)
Patient returned call and she is given results from Dr. Sabra Heck.  Verbalizes understanding of results and scheduled Pelvic ultrasound with Dr. Sabra Heck 09/22/15.  Advised she will be contacted with benefits information.  cc Lerry Liner for insurance pre-certification, referral processing and patient contact.  Routing to provider for final review. Patient agreeable to disposition. Will close encounter.

## 2015-09-19 ENCOUNTER — Telehealth: Payer: Self-pay | Admitting: Obstetrics & Gynecology

## 2015-09-19 NOTE — Telephone Encounter (Signed)
Spoke with pt regarding benefit forultrasound. Patient understood and agreeable. Patient ready to schedule. Patient scheduled 09/22/15 with Dr Sabra Heck. Pt aware of arrival date and time. Pt aware of 72 hours cancellation policy with [id number] fee. No further questions. Ok to close

## 2015-09-22 ENCOUNTER — Ambulatory Visit (INDEPENDENT_AMBULATORY_CARE_PROVIDER_SITE_OTHER): Payer: Medicare Other

## 2015-09-22 ENCOUNTER — Ambulatory Visit (INDEPENDENT_AMBULATORY_CARE_PROVIDER_SITE_OTHER): Payer: Medicare Other | Admitting: Obstetrics & Gynecology

## 2015-09-22 VITALS — BP 128/70 | HR 80 | Resp 18 | Ht 66.0 in | Wt 203.0 lb

## 2015-09-22 DIAGNOSIS — N950 Postmenopausal bleeding: Secondary | ICD-10-CM | POA: Diagnosis not present

## 2015-09-22 DIAGNOSIS — D251 Intramural leiomyoma of uterus: Secondary | ICD-10-CM

## 2015-09-22 NOTE — Progress Notes (Signed)
80 y.o. G38P0010 Widowed [place] American female here for pelvic ultrasound due to single episode of PMP bleeding and history of uterine fibroids.  She has not had an ultrasound since 2007.  Endometrial biopsy 09/05/15 were normal and negative for abnormal cells.  Pt has not had any additional bleeding.  At last visit, she questioned whether she should have a hysterectomy.  LMP:  Years ago.  PMP.  Findings:  UTERUS: 12.2 x 6.5 x 6.8cm with multiple fibroids, 3.2cm, 3.6cm, 2.6cm, 2.9cm, 5.7cm , 3.6cm.  Most contain calcifications. EMS: distorted due to fibroids, no masses noted. ADNEXA: Left ovary: 2.0 x 1.6 x 0.9cm       Right ovary: 2.9 x 2.0 x 1.1cm CUL DE SAC: no free fluid  Discussion: Findings reviewed with pt.  Prior ultrasound showed uterus measurements of 11.6 x 7.1 x 8.6cm with multiple calcified fibroids.  This is similar to today.    I really do not think she should proceed with a hysterectomy.  She is not symptomatic.  She's had two episodes of bleeding in the last four years.  Her pathology from the endometrial biopsy and her pap is normal.  She is very comfortable with this plan and knows to call when any new issues.  Assessment:   Enlarged, fibroid uterus.  Multiple calcified fibroids noted today.  Comparison to u/s form 2007 fairly siminlar  Plan:  Follow up for new problems and/or for routine gyn exam in one year  ~15 minutes spent with patient >50% of time was in face to face discussion of above.

## 2015-09-24 ENCOUNTER — Encounter: Payer: Self-pay | Admitting: Obstetrics & Gynecology

## 2015-10-18 ENCOUNTER — Ambulatory Visit: Payer: Self-pay | Admitting: Obstetrics & Gynecology

## 2015-10-28 ENCOUNTER — Other Ambulatory Visit: Payer: Self-pay

## 2015-10-28 DIAGNOSIS — Z1231 Encounter for screening mammogram for malignant neoplasm of breast: Secondary | ICD-10-CM

## 2015-11-07 ENCOUNTER — Ambulatory Visit
Admission: RE | Admit: 2015-11-07 | Discharge: 2015-11-07 | Disposition: A | Discharge status: Home / Self Care | Payer: Medicare Other | Source: Ambulatory Visit

## 2015-11-07 DIAGNOSIS — Z1231 Encounter for screening mammogram for malignant neoplasm of breast: Secondary | ICD-10-CM

## 2016-02-22 ENCOUNTER — Telehealth: Payer: Self-pay | Admitting: Cardiovascular Disease

## 2016-02-22 ENCOUNTER — Encounter: Payer: Self-pay | Admitting: Cardiovascular Disease

## 2016-02-22 NOTE — Telephone Encounter (Signed)
Closed encounter °

## 2016-03-09 ENCOUNTER — Ambulatory Visit: Payer: Medicare Other | Admitting: Cardiovascular Disease

## 2016-03-20 ENCOUNTER — Ambulatory Visit: Payer: Medicare Other | Admitting: Cardiovascular Disease

## 2016-04-11 ENCOUNTER — Ambulatory Visit (INDEPENDENT_AMBULATORY_CARE_PROVIDER_SITE_OTHER): Payer: Medicare Other | Admitting: Cardiovascular Disease

## 2016-04-11 ENCOUNTER — Encounter: Payer: Self-pay | Admitting: Cardiovascular Disease

## 2016-04-11 VITALS — BP 136/78 | HR 77 | Ht 65.0 in | Wt 213.6 lb

## 2016-04-11 DIAGNOSIS — I2510 Atherosclerotic heart disease of native coronary artery without angina pectoris: Secondary | ICD-10-CM

## 2016-04-11 DIAGNOSIS — I10 Essential (primary) hypertension: Secondary | ICD-10-CM | POA: Diagnosis not present

## 2016-04-11 DIAGNOSIS — E7800 Pure hypercholesterolemia, unspecified: Secondary | ICD-10-CM

## 2016-04-11 NOTE — Assessment & Plan Note (Signed)
History of hyperlipidemia on statin therapy followed by her PCP. 

## 2016-04-11 NOTE — Progress Notes (Signed)
04/11/2016 Kathryn Kent   1936-04-09  [id number]  Primary Physician Jerlyn Ly, MD Primary Cardiologist: Lorretta Harp MD Kathryn Kent  HPI:  The patient is a delightful, 80 year old, mildly overweight, widowed [place] American female with no children who is a patient of Dr. Crist Infante. I last saw her 03/18/15. She has a history of noncritical CAD by cath October 16, 2005, with scattered 30% to 40% stenoses in the RCA and LAD. She denied chest pain or shortness of breath, but does get occasional palpitations. Her other problems include hypertension, hyperlipidemia and obstructive sleep apnea on CPAP which she benefits from. Dr. Joylene Draft follows her lipid profile closely.since I saw her last she's been completely asymptomatic   Current Outpatient Prescriptions  Medication Sig Dispense Refill  . acetaminophen (TYLENOL) 500 MG tablet Take 1,000 mg by mouth every 6 (six) hours as needed for mild pain.    Marland Kitchen amLODipine (NORVASC) 10 MG tablet Take 10 mg by mouth daily.    . Ascorbic Acid (VITAMIN C) 1000 MG tablet Take 500 mg by mouth daily.     . chlorthalidone (HYGROTON) 25 MG tablet Take 0.5 tablets by mouth daily.    . Cholecalciferol (VITAMIN D3) 2000 UNITS capsule Take 2,000 Units by mouth daily.    Marland Kitchen Cod Liver Oil CAPS Take 1 capsule by mouth daily.    Marland Kitchen loratadine (ALLERGY RELIEF) 10 MG tablet Take 10 mg by mouth daily.    . Multiple Vitamin (MULTIVITAMIN) tablet Take 1 tablet by mouth daily.    . simvastatin (ZOCOR) 40 MG tablet Take 40 mg by mouth every evening.    . valsartan (DIOVAN) 320 MG tablet Take 1 tablet by mouth daily.     No current facility-administered medications for this visit.     Allergies  Allergen Reactions  . Lipitor [Atorvastatin]     Stomach pains, heart attack sx's    Social History   Social History  . Marital status: Widowed    Spouse name: N/A  . Number of children: 0  . Years of education: N/A   Occupational History  .  retired    Social History Main Topics  . Smoking status: Never Smoker  . Smokeless tobacco: Never Used  . Alcohol use Yes     Comment: rarely-once a year  . Drug use: No  . Sexual activity: Not Currently    Birth control/ protection: Post-menopausal   Other Topics Concern  . Not on file   Social History Narrative  . No narrative on file     Review of Systems: General: negative for chills, fever, night sweats or weight changes.  Cardiovascular: negative for chest pain, dyspnea on exertion, edema, orthopnea, palpitations, paroxysmal nocturnal dyspnea or shortness of breath Dermatological: negative for rash Respiratory: negative for cough or wheezing Urologic: negative for hematuria Abdominal: negative for nausea, vomiting, diarrhea, bright red blood per rectum, melena, or hematemesis Neurologic: negative for visual changes, syncope, or dizziness All other systems reviewed and are otherwise negative except as noted above.    Blood pressure 136/78, pulse 77, height 5\' 5"  (1.651 m), weight 213 lb 9.6 oz (96.9 kg).  General appearance: alert and no distress Neck: no adenopathy, no carotid bruit, no JVD, supple, symmetrical, trachea midline and thyroid not enlarged, symmetric, no tenderness/mass/nodules Lungs: clear to auscultation bilaterally Heart: regular rate and rhythm, S1, S2 normal, no murmur, click, rub or gallop Extremities: extremities normal, atraumatic, no cyanosis or edema  EKG normal sinus  rhythm at 77 with left axis deviation and inferolateral T-wave inversion unchanged from prior EKGs. I personally reviewed this EKG.  ASSESSMENT AND PLAN:   Essential hypertension History of hypertension blood pressure measures 136/78. She is on amlodipine, Diovan and chlorthalidone. Continue current meds at current dosing  Hyperlipidemia History of hyperlipidemia on statin therapy followed by her PCP  Coronary artery disease History of coronary artery disease status post  cardiac catheterization 10/16/05 revealing scattered 30-40% stenoses in the RCA and LAD. The patient denies chest pain or shortness of breath.      Lorretta Harp MD FACP,FACC,FAHA, [facility] 04/11/2016 9:19 AM

## 2016-04-11 NOTE — Assessment & Plan Note (Signed)
History of hypertension blood pressure measures 136/78. She is on amlodipine, Diovan and chlorthalidone. Continue current meds at current dosing

## 2016-04-11 NOTE — Assessment & Plan Note (Signed)
History of coronary artery disease status post cardiac catheterization 10/16/05 revealing scattered 30-40% stenoses in the RCA and LAD. The patient denies chest pain or shortness of breath.

## 2016-04-11 NOTE — Patient Instructions (Signed)
Medication Instructions:  NO CHANGES.  Labwork: Labwork will be requested from your primary care physician.   Follow-Up: Your physician wants you to follow-up in: [place].  You will receive a reminder letter in the mail two months in advance. If you don't receive a letter, please call our office to schedule the follow-up appointment.   If you need a refill on your cardiac medications before your next appointment, please call your pharmacy.

## 2016-09-24 ENCOUNTER — Ambulatory Visit: Payer: Medicare Other | Admitting: Obstetrics & Gynecology

## 2016-09-24 ENCOUNTER — Encounter: Payer: Self-pay | Admitting: Obstetrics & Gynecology

## 2016-09-24 NOTE — Progress Notes (Deleted)
81 y.o. G1P0010 WidowedAfrican AmericanF here for annual exam.    No LMP recorded. Patient is postmenopausal.          Sexually active: {yes no:314532}  The current method of family planning is post menopausal status.    Exercising: {yes no:314532}  {types:19826} Smoker:  {YES P5382123  Health Maintenance: Pap:  09/05/15 Neg.  History of abnormal Pap:  {YES NO:22349} MMG:  11/07/15 BIRADS1:neg  Colonoscopy:  *** BMD:   *** TDaP:  *** Pneumonia vaccine(s):  *** Zostavax:   *** Hep C testing: *** Screening Labs: ***, Hb today: ***, Urine today: ***   reports that she has never smoked. She has never used smokeless tobacco. She reports that she drinks alcohol. She reports that she does not use drugs.  Past Medical History:  Diagnosis Date  . Alcoholism ([place])   . Anemia   . Atrial fibrillation ([place])   . Chicken pox   . Colon polyps   . Coronary artery disease    status post cardiac catheterization performed 10/16/05 revealing noncritical CAD  . Diverticulosis   . Fibroid tumor   . Heart murmur   . Hyperlipidemia   . Hypertension   . IFG (impaired fasting glucose)   . OSA (obstructive sleep apnea)   . Osteoarthritis   . Shingles     Past Surgical History:  Procedure Laterality Date  . APPENDECTOMY    . cyst removed     abdomen  . great toe surgery Bilateral 1987    Current Outpatient Prescriptions  Medication Sig Dispense Refill  . acetaminophen (TYLENOL) 500 MG tablet Take 1,000 mg by mouth every 6 (six) hours as needed for mild pain.    Marland Kitchen amLODipine (NORVASC) 10 MG tablet Take 10 mg by mouth daily.    . Ascorbic Acid (VITAMIN C) 1000 MG tablet Take 500 mg by mouth daily.     . chlorthalidone (HYGROTON) 25 MG tablet Take 0.5 tablets by mouth daily.    . Cholecalciferol (VITAMIN D3) 2000 UNITS capsule Take 2,000 Units by mouth daily.    Marland Kitchen Cod Liver Oil CAPS Take 1 capsule by mouth daily.    Marland Kitchen loratadine (ALLERGY RELIEF) 10 MG tablet Take 10 mg by mouth daily.    .  Multiple Vitamin (MULTIVITAMIN) tablet Take 1 tablet by mouth daily.    . simvastatin (ZOCOR) 40 MG tablet Take 40 mg by mouth every evening.    . valsartan (DIOVAN) 320 MG tablet Take 1 tablet by mouth daily.     No current facility-administered medications for this visit.     Family History  Problem Relation Age of Onset  . Prostate cancer Father     ?  Marland Kitchen Alcohol abuse Mother   . Aneurysm Brother     heart  . Stroke Sister   . Breast cancer Sister 25  . Lung cancer Brother     ROS:  Pertinent items are noted in HPI.  Otherwise, a comprehensive ROS was negative.  Exam:   There were no vitals taken for this visit.  Weight change: @WEIGHTCHANGE @ Height:      Ht Readings from Last 3 Encounters:  04/11/16 5\' 5"  (1.651 m)  09/22/15 5\' 6"  (1.676 m)  09/05/15 5\' 6"  (1.676 m)    General appearance: alert, cooperative and appears stated age Head: Normocephalic, without obvious abnormality, atraumatic Neck: no adenopathy, supple, symmetrical, trachea midline and thyroid {EXAM; THYROID:18604} Lungs: clear to auscultation bilaterally Breasts: {Exam; breast:13139::"normal appearance, no masses or tenderness"} Heart:  regular rate and rhythm Abdomen: soft, non-tender; bowel sounds normal; no masses,  no organomegaly Extremities: extremities normal, atraumatic, no cyanosis or edema Skin: Skin color, texture, turgor normal. No rashes or lesions Lymph nodes: Cervical, supraclavicular, and axillary nodes normal. No abnormal inguinal nodes palpated Neurologic: Grossly normal   Pelvic: External genitalia:  no lesions              Urethra:  normal appearing urethra with no masses, tenderness or lesions              Bartholins and Skenes: normal                 Vagina: normal appearing vagina with normal color and discharge, no lesions              Cervix: {exam; cervix:14595}              Pap taken: {yes no:314532} Bimanual Exam:  Uterus:  {exam; uterus:12215}              Adnexa: {exam;  adnexa:12223}               Rectovaginal: Confirms               Anus:  normal sphincter tone, no lesions  Chaperone was present for exam.  A:  Well Woman with normal exam  P:   {plan; gyn:5269::"mammogram","pap smear","return annually or prn"}

## 2016-10-05 NOTE — Progress Notes (Deleted)
81 y.o. G1P0010 WidowedAfrican AmericanF here for annual exam.    No LMP recorded. Patient is postmenopausal.          Sexually active: {yes no:314532}  The current method of family planning is post menopausal status.    Exercising: {yes no:314532}  {types:19826} Smoker:  {YES NO:22349}  Health Maintenance: Pap:  09/05/15 Neg  History of abnormal Pap:  {YES NO:22349} MMG:  11/07/15 BIRADS1:neg  Colonoscopy:  *** BMD:   *** TDaP:  *** Pneumonia vaccine(s):  *** Zostavax:   *** Hep C testing: *** Screening Labs: ***, Hb today: ***, Urine today: ***   reports that she has never smoked. She has never used smokeless tobacco. She reports that she drinks alcohol. She reports that she does not use drugs.  Past Medical History:  Diagnosis Date  . Alcoholism ([place])   . Anemia   . Atrial fibrillation ([place])   . Chicken pox   . Colon polyps   . Coronary artery disease    status post cardiac catheterization performed 10/16/05 revealing noncritical CAD  . Diverticulosis   . Fibroid tumor   . Heart murmur   . Hyperlipidemia   . Hypertension   . IFG (impaired fasting glucose)   . OSA (obstructive sleep apnea)   . Osteoarthritis   . Shingles     Past Surgical History:  Procedure Laterality Date  . APPENDECTOMY    . cyst removed     abdomen  . great toe surgery Bilateral 1987    Current Outpatient Prescriptions  Medication Sig Dispense Refill  . acetaminophen (TYLENOL) 500 MG tablet Take 1,000 mg by mouth every 6 (six) hours as needed for mild pain.    Marland Kitchen amLODipine (NORVASC) 10 MG tablet Take 10 mg by mouth daily.    . Ascorbic Acid (VITAMIN C) 1000 MG tablet Take 500 mg by mouth daily.     . chlorthalidone (HYGROTON) 25 MG tablet Take 0.5 tablets by mouth daily.    . Cholecalciferol (VITAMIN D3) 2000 UNITS capsule Take 2,000 Units by mouth daily.    Marland Kitchen Cod Liver Oil CAPS Take 1 capsule by mouth daily.    Marland Kitchen loratadine (ALLERGY RELIEF) 10 MG tablet Take 10 mg by mouth daily.    .  Multiple Vitamin (MULTIVITAMIN) tablet Take 1 tablet by mouth daily.    . simvastatin (ZOCOR) 40 MG tablet Take 40 mg by mouth every evening.    . valsartan (DIOVAN) 320 MG tablet Take 1 tablet by mouth daily.     No current facility-administered medications for this visit.     Family History  Problem Relation Age of Onset  . Prostate cancer Father     ?  Marland Kitchen Alcohol abuse Mother   . Aneurysm Brother     heart  . Stroke Sister   . Breast cancer Sister 68  . Lung cancer Brother     ROS:  Pertinent items are noted in HPI.  Otherwise, a comprehensive ROS was negative.  Exam:   There were no vitals taken for this visit.  Weight change: @WEIGHTCHANGE @ Height:      Ht Readings from Last 3 Encounters:  04/11/16 5\' 5"  (1.651 m)  09/22/15 5\' 6"  (1.676 m)  09/05/15 5\' 6"  (1.676 m)    General appearance: alert, cooperative and appears stated age Head: Normocephalic, without obvious abnormality, atraumatic Neck: no adenopathy, supple, symmetrical, trachea midline and thyroid {EXAM; THYROID:18604} Lungs: clear to auscultation bilaterally Breasts: {Exam; breast:13139::"normal appearance, no masses or tenderness"} Heart:  regular rate and rhythm Abdomen: soft, non-tender; bowel sounds normal; no masses,  no organomegaly Extremities: extremities normal, atraumatic, no cyanosis or edema Skin: Skin color, texture, turgor normal. No rashes or lesions Lymph nodes: Cervical, supraclavicular, and axillary nodes normal. No abnormal inguinal nodes palpated Neurologic: Grossly normal   Pelvic: External genitalia:  no lesions              Urethra:  normal appearing urethra with no masses, tenderness or lesions              Bartholins and Skenes: normal                 Vagina: normal appearing vagina with normal color and discharge, no lesions              Cervix: {exam; cervix:14595}              Pap taken: {yes no:314532} Bimanual Exam:  Uterus:  {exam; uterus:12215}              Adnexa: {exam;  adnexa:12223}               Rectovaginal: Confirms               Anus:  normal sphincter tone, no lesions  Chaperone was present for exam.  A:  Well Woman with normal exam  P:   {plan; gyn:5269::"mammogram","pap smear","return annually or prn"}

## 2016-10-08 ENCOUNTER — Ambulatory Visit: Payer: Medicare Other | Admitting: Obstetrics & Gynecology

## 2016-10-08 ENCOUNTER — Telehealth: Payer: Self-pay | Admitting: Obstetrics & Gynecology
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
## 2016-10-08 NOTE — Telephone Encounter (Signed)
Thanks for the update.  Ok to close encounter. 

## 2016-12-19 ENCOUNTER — Other Ambulatory Visit: Payer: Self-pay | Admitting: Internal Medicine

## 2016-12-19 DIAGNOSIS — Z1231 Encounter for screening mammogram for malignant neoplasm of breast: Secondary | ICD-10-CM

## 2017-01-01 ENCOUNTER — Emergency Department (HOSPITAL_COMMUNITY): Payer: Medicare Other

## 2017-01-01 ENCOUNTER — Encounter (HOSPITAL_COMMUNITY): Payer: Self-pay

## 2017-01-01 ENCOUNTER — Emergency Department (HOSPITAL_COMMUNITY)
Admission: EM | Admit: 2017-01-01 | Discharge: 2017-01-01 | Disposition: A | Discharge status: Home / Self Care | Payer: Medicare Other | Attending: Emergency Medicine | Admitting: Emergency Medicine

## 2017-01-01 DIAGNOSIS — I10 Essential (primary) hypertension: Secondary | ICD-10-CM | POA: Diagnosis not present

## 2017-01-01 DIAGNOSIS — Z79899 Other long term (current) drug therapy: Secondary | ICD-10-CM | POA: Diagnosis not present

## 2017-01-01 DIAGNOSIS — N23 Unspecified renal colic: Secondary | ICD-10-CM | POA: Diagnosis not present

## 2017-01-01 DIAGNOSIS — I2510 Atherosclerotic heart disease of native coronary artery without angina pectoris: Secondary | ICD-10-CM | POA: Insufficient documentation

## 2017-01-01 DIAGNOSIS — R1031 Right lower quadrant pain: Secondary | ICD-10-CM | POA: Diagnosis present

## 2017-01-01 LAB — I-STAT CHEM 8, ED
BUN: 19 mg/dL (ref 6–20)
CALCIUM ION: 1.11 mmol/L — AB (ref 1.15–1.40)
Chloride: 99 mmol/L — ABNORMAL LOW (ref 101–111)
Creatinine, Ser: 0.9 mg/dL (ref 0.44–1.00)
GLUCOSE: 95 mg/dL (ref 65–99)
HCT: 37 % (ref 36.0–46.0)
HEMOGLOBIN: 12.6 g/dL (ref 12.0–15.0)
Potassium: 3.2 mmol/L — ABNORMAL LOW (ref 3.5–5.1)
SODIUM: 139 mmol/L (ref 135–145)
TCO2: 30 mmol/L (ref 0–100)

## 2017-01-01 LAB — URINALYSIS, ROUTINE W REFLEX MICROSCOPIC
BACTERIA UA: NONE SEEN
Bilirubin Urine: NEGATIVE
GLUCOSE, UA: NEGATIVE mg/dL
HGB URINE DIPSTICK: NEGATIVE
Ketones, ur: 20 mg/dL — AB
NITRITE: NEGATIVE
PROTEIN: NEGATIVE mg/dL
SPECIFIC GRAVITY, URINE: 1.024 (ref 1.005–1.030)
pH: 6 (ref 5.0–8.0)

## 2017-01-01 NOTE — ED Provider Notes (Signed)
[facility] DEPT Provider Note   CSN: [id number] Arrival date & time: 01/01/17  1720     History   Chief Complaint Chief Complaint  Patient presents with  . Flank Pain    HPI Kathryn Kent is a 81 y.o. female.  Patient is a 81 year old female who presents with right flank pain. She was seen at an outside ED on May 5 in [place], [place] and diagnosed with an 8 mm mid ureteral stone on the right. She was seen again in outside emergency department and [place], [place] on July 4 and was told that the stone was in the same position. She has had a CT scan on both ED visits. She continues to have some intermittent pain in the right flank area. She states that it comes and goes and is unchanged over the last 2 months. She denies abdominal pain. No associated nausea and vomiting today although at times when the pain is bad she has some nausea associated with it. No fevers. No urinary symptoms. No prior history of kidney stones. She recently has returned to [facility] but has not yet followed up with a urologist.      Past Medical History:  Diagnosis Date  . Alcoholism ([place])   . Anemia   . Atrial fibrillation ([place])   . Chicken pox   . Colon polyps   . Coronary artery disease    status post cardiac catheterization performed 10/16/05 revealing noncritical CAD  . Diverticulosis   . Fibroid tumor   . Heart murmur   . Hyperlipidemia   . Hypertension   . IFG (impaired fasting glucose)   . OSA (obstructive sleep apnea)   . Osteoarthritis   . Shingles     Patient Active Problem List   Diagnosis Date Noted  . Intramural leiomyoma of uterus 09/12/2014  . Sebaceous cyst of labia 09/12/2014  . Essential hypertension 05/28/2013  . Hyperlipidemia 05/28/2013  . Coronary artery disease 05/28/2013  . Heart murmur     Past Surgical History:  Procedure Laterality Date  . APPENDECTOMY    . cyst removed     abdomen  . great toe surgery Bilateral 1987    OB History    Gravida  Para Term Preterm AB Living   1 0     1 0   SAB TAB Ectopic Multiple Live Births     1             Home Medications    Prior to Admission medications   Medication Sig Start Date End Date Taking? Authorizing Provider  amLODipine (NORVASC) 10 MG tablet Take 10 mg by mouth daily.   Yes [provider]  Ascorbic Acid (VITAMIN C) 1000 MG tablet Take 500 mg by mouth daily.    Yes [provider]  chlorthalidone (HYGROTON) 25 MG tablet Take 0.5 tablets by mouth daily. 03/12/16  Yes [provider]  Cholecalciferol (VITAMIN D3) 2000 UNITS capsule Take 2,000 Units by mouth daily.   Yes [provider]  HYDROcodone-acetaminophen (NORCO/VICODIN) 5-325 MG tablet Take 1 tablet by mouth every 8 (eight) hours as needed for moderate pain or severe pain.   Yes [provider]  Multiple Vitamin (MULTIVITAMIN) tablet Take 1 tablet by mouth daily.   Yes [provider]  simvastatin (ZOCOR) 40 MG tablet Take 40 mg by mouth daily.    Yes [provider]  valsartan (DIOVAN) 320 MG tablet Take 1 tablet by mouth daily. 03/15/16  Yes [provider]  acetaminophen (TYLENOL) 500 MG tablet Take 1,000 mg by mouth every 6 (six) hours as needed for mild pain.    [provider]  [facility] Liver Oil CAPS Take 1 capsule by mouth daily.    [provider]  loratadine (ALLERGY RELIEF) 10 MG tablet Take 10 mg by mouth daily.    [provider]    Family History Family History  Problem Relation Age of Onset  . Prostate cancer Father        ?  Marland Kitchen Alcohol abuse Mother   . Aneurysm Brother        heart  . Stroke Sister   . Breast cancer Sister 52  . Lung cancer Brother     Social History Social History  Substance Use Topics  . Smoking status: Never Smoker  . Smokeless tobacco: Never Used  . Alcohol use No     Allergies   Lipitor [atorvastatin]   Review of Systems Review of Systems  Constitutional: Negative for  chills, diaphoresis, fatigue and fever.  HENT: Negative for congestion, rhinorrhea and sneezing.   Eyes: Negative.   Respiratory: Negative for cough, chest tightness and shortness of breath.   Cardiovascular: Negative for chest pain and leg swelling.  Gastrointestinal: Negative for abdominal pain, blood in stool, diarrhea, nausea and vomiting.  Genitourinary: Positive for flank pain. Negative for difficulty urinating, frequency and hematuria.  Musculoskeletal: Positive for back pain. Negative for arthralgias.  Skin: Negative for rash.  Neurological: Negative for dizziness, speech difficulty, weakness, numbness and headaches.     Physical Exam Updated Vital Signs BP 123/78   Pulse 66   Temp 97.9 F (36.6 C) (Oral)   Resp 16   Ht 5\' 6"  (1.676 m)   Wt 83.5 kg (184 lb)   SpO2 99%   BMI 29.70 kg/m   Physical Exam  Constitutional: She is oriented to person, place, and time. She appears well-developed and well-nourished.  HENT:  Head: Normocephalic and atraumatic.  Eyes: Pupils are equal, round, and reactive to light.  Neck: Normal range of motion. Neck supple.  Cardiovascular: Normal rate, regular rhythm and normal heart sounds.   Pulmonary/Chest: Effort normal and breath sounds normal. No respiratory distress. She has no wheezes. She has no rales. She exhibits no tenderness.  Abdominal: Soft. Bowel sounds are normal. There is no tenderness. There is no rebound and no guarding.  Mild tenderness to the right mid back/flank area  Musculoskeletal: Normal range of motion. She exhibits no edema.  Lymphadenopathy:    She has no cervical adenopathy.  Neurological: She is alert and oriented to person, place, and time.  Skin: Skin is warm and dry. No rash noted.  Psychiatric: She has a normal mood and affect.     ED Treatments / Results  Labs (all labs ordered are listed, but only abnormal results are displayed) Labs Reviewed  URINALYSIS, ROUTINE W REFLEX MICROSCOPIC - Abnormal;  Notable for the following:       Result Value   Ketones, ur 20 (*)    Leukocytes, UA TRACE (*)    Squamous Epithelial / LPF 0-5 (*)    All other components within normal limits  I-STAT CHEM 8, ED - Abnormal; Notable for the following:    Potassium 3.2 (*)    Chloride 99 (*)    Calcium, Ion 1.11 (*)    All other components within normal limits    EKG  EKG Interpretation None       Radiology Dg Abdomen 1  View  Result Date: 01/01/2017 CLINICAL DATA:  Intermittent right flank pain beginning 2 weeks ago. EXAM: ABDOMEN - 1 VIEW COMPARISON:  None. FINDINGS: No evidence of ileus or obstruction. No evidence of urinary tract calcification. Extensive calcifications in the pelvis probably relate to calcified leiomyoma is. Chronic spinal degenerative changes. IMPRESSION: No acute finding. Pelvic calcifications probably secondary to the leiomyomas. Electronically Signed   By: Nelson Chimes M.D.   On: 01/01/2017 20:13    Procedures Procedures (including critical care time)  Medications Ordered in ED Medications - No data to display   Initial Impression / Assessment and Plan / ED Course  I have reviewed the triage vital signs and the nursing notes.  Pertinent labs & imaging results that were available during my care of the patient were reviewed by me and considered in my medical decision making (see chart for details).     Patient presents with right flank pain. She's been evaluated twice for the same type pain and is found to have an 8 mm kidney stone in the right mid ureter. I was able to see the first ED visit that she had in May which documented this finding. She states she had a repeat CT scan July 4 which showed that the stone had not moved although I cannot pull up this documentation in Epic. She is comfortable currently. She denies any for any pain medication. There is no evidence of a urinary tract infection. Her creatinine is normal. I did advise her if that since this kidney stone  is been there for 2 months now that I likely will need urologic intervention. I encouraged her to have close follow-up with the urologist. She was given information regarding following up with your [facility]. She denies any for any pain medication prescriptions as she still has some hydrocodone from her prior ED visits. Return precautions were given.  Final Clinical Impressions(s) / ED Diagnoses   Final diagnoses:  Ureteral colic    New Prescriptions New Prescriptions   No medications on file     Malvin Johns, MD 01/01/17 2035

## 2017-01-01 NOTE — ED Triage Notes (Signed)
Patient c/o intermittent right flank pain 2 weeks ago and states it went away and started hurting again today. Patient has a history of kidney stones. Patient states urinary frequency yesterday.

## 2017-01-03 ENCOUNTER — Ambulatory Visit (HOSPITAL_BASED_OUTPATIENT_CLINIC_OR_DEPARTMENT_OTHER): Payer: Medicare Other | Admitting: Anesthesiology

## 2017-01-03 ENCOUNTER — Ambulatory Visit (HOSPITAL_BASED_OUTPATIENT_CLINIC_OR_DEPARTMENT_OTHER)
Admission: RE | Admit: 2017-01-03 | Discharge: 2017-01-03 | Disposition: A | Discharge status: Home / Self Care | Payer: Medicare Other | Source: Ambulatory Visit | Attending: Urology | Admitting: Urology

## 2017-01-03 ENCOUNTER — Encounter (HOSPITAL_BASED_OUTPATIENT_CLINIC_OR_DEPARTMENT_OTHER)
Admission: RE | Disposition: A | Discharge status: Home / Self Care | Payer: Self-pay | Source: Ambulatory Visit | Attending: Urology

## 2017-01-03 ENCOUNTER — Other Ambulatory Visit: Payer: Self-pay | Admitting: Urology

## 2017-01-03 ENCOUNTER — Encounter (HOSPITAL_BASED_OUTPATIENT_CLINIC_OR_DEPARTMENT_OTHER): Payer: Self-pay | Admitting: *Deleted

## 2017-01-03 DIAGNOSIS — Z79899 Other long term (current) drug therapy: Secondary | ICD-10-CM | POA: Insufficient documentation

## 2017-01-03 DIAGNOSIS — Z8719 Personal history of other diseases of the digestive system: Secondary | ICD-10-CM | POA: Insufficient documentation

## 2017-01-03 DIAGNOSIS — M1990 Unspecified osteoarthritis, unspecified site: Secondary | ICD-10-CM | POA: Diagnosis not present

## 2017-01-03 DIAGNOSIS — I2510 Atherosclerotic heart disease of native coronary artery without angina pectoris: Secondary | ICD-10-CM | POA: Diagnosis not present

## 2017-01-03 DIAGNOSIS — Z86010 Personal history of colonic polyps: Secondary | ICD-10-CM | POA: Insufficient documentation

## 2017-01-03 DIAGNOSIS — E785 Hyperlipidemia, unspecified: Secondary | ICD-10-CM | POA: Insufficient documentation

## 2017-01-03 DIAGNOSIS — Z888 Allergy status to other drugs, medicaments and biological substances status: Secondary | ICD-10-CM | POA: Insufficient documentation

## 2017-01-03 DIAGNOSIS — N132 Hydronephrosis with renal and ureteral calculous obstruction: Secondary | ICD-10-CM | POA: Insufficient documentation

## 2017-01-03 DIAGNOSIS — F1020 Alcohol dependence, uncomplicated: Secondary | ICD-10-CM | POA: Insufficient documentation

## 2017-01-03 DIAGNOSIS — I10 Essential (primary) hypertension: Secondary | ICD-10-CM | POA: Diagnosis not present

## 2017-01-03 DIAGNOSIS — G4733 Obstructive sleep apnea (adult) (pediatric): Secondary | ICD-10-CM | POA: Insufficient documentation

## 2017-01-03 DIAGNOSIS — I4891 Unspecified atrial fibrillation: Secondary | ICD-10-CM | POA: Diagnosis not present

## 2017-01-03 DIAGNOSIS — N201 Calculus of ureter: Secondary | ICD-10-CM | POA: Diagnosis present

## 2017-01-03 HISTORY — PX: HOLMIUM LASER APPLICATION: SHX5852

## 2017-01-03 HISTORY — PX: CYSTOSCOPY W/ URETERAL STENT PLACEMENT: SHX1429

## 2017-01-03 LAB — POCT I-STAT, CHEM 8
BUN: 13 mg/dL (ref 6–20)
CHLORIDE: 100 mmol/L — AB (ref 101–111)
Calcium, Ion: 1.35 mmol/L (ref 1.15–1.40)
Creatinine, Ser: 0.7 mg/dL (ref 0.44–1.00)
Glucose, Bld: 115 mg/dL — ABNORMAL HIGH (ref 65–99)
HEMATOCRIT: 41 % (ref 36.0–46.0)
HEMOGLOBIN: 13.9 g/dL (ref 12.0–15.0)
POTASSIUM: 3.5 mmol/L (ref 3.5–5.1)
SODIUM: 141 mmol/L (ref 135–145)
TCO2: 28 mmol/L (ref 0–100)

## 2017-01-03 SURGERY — CYSTOSCOPY, WITH RETROGRADE PYELOGRAM AND URETERAL STENT INSERTION
Anesthesia: General | Site: Renal | Laterality: Right

## 2017-01-03 MED ORDER — ONDANSETRON HCL 4 MG/2ML IJ SOLN
INTRAMUSCULAR | Status: AC
  Filled 2017-01-03: qty 2

## 2017-01-03 MED ORDER — KETOROLAC TROMETHAMINE 30 MG/ML IJ SOLN
INTRAMUSCULAR | Status: DC | PRN
  Administered 2017-01-03: 30 mg via INTRAVENOUS

## 2017-01-03 MED ORDER — LACTATED RINGERS IV SOLN
INTRAVENOUS | Status: DC
  Administered 2017-01-03: 14:00:00 via INTRAVENOUS
  Filled 2017-01-03: qty 1000

## 2017-01-03 MED ORDER — ONDANSETRON HCL 4 MG/2ML IJ SOLN
INTRAMUSCULAR | Status: DC | PRN
  Administered 2017-01-03: 4 mg via INTRAVENOUS

## 2017-01-03 MED ORDER — HYDROCODONE-ACETAMINOPHEN 5-325 MG PO TABS: 1.0000 | ORAL_TABLET | Freq: Three times a day (TID) | ORAL | 0 refills | Status: DC | PRN

## 2017-01-03 MED ORDER — KETOROLAC TROMETHAMINE 30 MG/ML IJ SOLN
INTRAMUSCULAR | Status: AC
  Filled 2017-01-03: qty 1

## 2017-01-03 MED ORDER — FENTANYL CITRATE (PF) 100 MCG/2ML IJ SOLN
25.0000 ug | INTRAMUSCULAR | Status: DC | PRN
  Filled 2017-01-03: qty 1

## 2017-01-03 MED ORDER — MEPERIDINE HCL 25 MG/ML IJ SOLN
6.2500 mg | INTRAMUSCULAR | Status: DC | PRN
  Filled 2017-01-03: qty 1

## 2017-01-03 MED ORDER — METOCLOPRAMIDE HCL 5 MG/ML IJ SOLN
10.0000 mg | Freq: Once | INTRAMUSCULAR | Status: DC | PRN
  Filled 2017-01-03: qty 2

## 2017-01-03 MED ORDER — PROPOFOL 10 MG/ML IV BOLUS
INTRAVENOUS | Status: AC
  Filled 2017-01-03: qty 40

## 2017-01-03 MED ORDER — SODIUM CHLORIDE 0.9 % IR SOLN
Status: DC | PRN
  Administered 2017-01-03: 4000 mL

## 2017-01-03 MED ORDER — CEFAZOLIN SODIUM-DEXTROSE 2-4 GM/100ML-% IV SOLN
INTRAVENOUS | Status: AC
  Filled 2017-01-03: qty 100

## 2017-01-03 MED ORDER — PROPOFOL 10 MG/ML IV BOLUS
INTRAVENOUS | Status: DC | PRN
  Administered 2017-01-03: 140 mg via INTRAVENOUS

## 2017-01-03 MED ORDER — IOHEXOL 300 MG/ML  SOLN
INTRAMUSCULAR | Status: DC | PRN
  Administered 2017-01-03: 7 mL

## 2017-01-03 MED ORDER — ONDANSETRON HCL 4 MG/2ML IJ SOLN
INTRAMUSCULAR | Status: AC
Start: 2017-01-03 — End: 2017-01-03
  Filled 2017-01-03: qty 2

## 2017-01-03 MED ORDER — LIDOCAINE 2% (20 MG/ML) 5 ML SYRINGE
INTRAMUSCULAR | Status: DC | PRN
  Administered 2017-01-03: 60 mg via INTRAVENOUS

## 2017-01-03 MED ORDER — DEXAMETHASONE SODIUM PHOSPHATE 10 MG/ML IJ SOLN
INTRAMUSCULAR | Status: AC
Start: 2017-01-03 — End: 2017-01-03
  Filled 2017-01-03: qty 1

## 2017-01-03 MED ORDER — FENTANYL CITRATE (PF) 100 MCG/2ML IJ SOLN
INTRAMUSCULAR | Status: DC | PRN
  Administered 2017-01-03: 50 ug via INTRAVENOUS

## 2017-01-03 MED ORDER — DEXAMETHASONE SODIUM PHOSPHATE 4 MG/ML IJ SOLN
INTRAMUSCULAR | Status: DC | PRN
  Administered 2017-01-03: 10 mg via INTRAVENOUS

## 2017-01-03 MED ORDER — EPHEDRINE SULFATE-NACL 50-0.9 MG/10ML-% IV SOSY
PREFILLED_SYRINGE | INTRAVENOUS | Status: DC | PRN
  Administered 2017-01-03 (×2): 10 mg via INTRAVENOUS

## 2017-01-03 MED ORDER — FENTANYL CITRATE (PF) 100 MCG/2ML IJ SOLN
INTRAMUSCULAR | Status: AC
  Filled 2017-01-03: qty 2

## 2017-01-03 MED ORDER — LIDOCAINE 2% (20 MG/ML) 5 ML SYRINGE
INTRAMUSCULAR | Status: AC
  Filled 2017-01-03: qty 5

## 2017-01-03 SURGICAL SUPPLY — 23 items
BAG DRAIN URO-CYSTO SKYTR STRL (DRAIN) ×4 IMPLANT
BASKET STONE 1.7 NGAGE (UROLOGICAL SUPPLIES) ×2 IMPLANT
CATH INTERMIT  6FR 70CM (CATHETERS) ×2 IMPLANT
CLOTH BEACON ORANGE TIMEOUT ST (SAFETY) ×2 IMPLANT
FIBER LASER FLEXIVA 365 (UROLOGICAL SUPPLIES) ×2 IMPLANT
GLOVE BIO SURGEON STRL SZ 6.5 (GLOVE) ×2 IMPLANT
GLOVE BIO SURGEON STRL SZ8 (GLOVE) ×2 IMPLANT
GLOVE BIOGEL PI IND STRL 6.5 (GLOVE) ×2 IMPLANT
GLOVE BIOGEL PI INDICATOR 6.5 (GLOVE) ×2
GOWN STRL REUS W/TWL LRG LVL3 (GOWN DISPOSABLE) ×2 IMPLANT
GOWN STRL REUS W/TWL XL LVL3 (GOWN DISPOSABLE) ×2 IMPLANT
GUIDEWIRE ANG ZIPWIRE 038X150 (WIRE) ×2 IMPLANT
GUIDEWIRE STR DUAL SENSOR (WIRE) IMPLANT
IV NS 1000ML (IV SOLUTION) ×1
IV NS 1000ML BAXH (IV SOLUTION) ×1 IMPLANT
IV NS IRRIG 3000ML ARTHROMATIC (IV SOLUTION) ×2 IMPLANT
KIT RM TURNOVER CYSTO AR (KITS) ×2 IMPLANT
MANIFOLD NEPTUNE II (INSTRUMENTS) ×2 IMPLANT
NS IRRIG 500ML POUR BTL (IV SOLUTION) ×2 IMPLANT
PACK CYSTO (CUSTOM PROCEDURE TRAY) ×2 IMPLANT
STENT URET 6FRX24 CONTOUR (STENTS) ×2 IMPLANT
SYRINGE 10CC LL (SYRINGE) ×2 IMPLANT
TUBE CONNECTING 12X1/4 (SUCTIONS) IMPLANT

## 2017-01-03 NOTE — Anesthesia Preprocedure Evaluation (Signed)
Anesthesia Evaluation  Patient identified by MRN, date of birth, ID band Patient awake    Reviewed: Allergy & Precautions, NPO status , Patient's Chart, lab work & pertinent test results  Airway Mallampati: II  TM Distance: >3 FB Neck ROM: Full    Dental no notable dental hx.    Pulmonary neg pulmonary ROS,    Pulmonary exam normal breath sounds clear to auscultation       Cardiovascular hypertension, Pt. on medications + CAD  Normal cardiovascular exam+ dysrhythmias Atrial Fibrillation  Rhythm:Regular Rate:Normal     Neuro/Psych negative neurological ROS  negative psych ROS   GI/Hepatic negative GI ROS, (+)     substance abuse  alcohol use,   Endo/Other  negative endocrine ROS  Renal/GU negative Renal ROS  negative genitourinary   Musculoskeletal negative musculoskeletal ROS (+)   Abdominal   Peds negative pediatric ROS (+)  Hematology negative hematology ROS (+)   Anesthesia Other Findings   Reproductive/Obstetrics negative OB ROS                             Anesthesia Physical Anesthesia Plan  ASA: III  Anesthesia Plan: General   Post-op Pain Management:    Induction: Intravenous  PONV Risk Score and Plan: 4 or greater and Ondansetron, Dexamethasone, Propofol, Midazolam and Treatment may vary due to age or medical condition  Airway Management Planned: LMA  Additional Equipment:   Intra-op Plan:   Post-operative Plan: Extubation in OR  Informed Consent: I have reviewed the patients History and Physical, chart, labs and discussed the procedure including the risks, benefits and alternatives for the proposed anesthesia with the patient or authorized representative who has indicated his/her understanding and acceptance.   Dental advisory given  Plan Discussed with: CRNA  Anesthesia Plan Comments:         Anesthesia Quick Evaluation

## 2017-01-03 NOTE — H&P (Signed)
Urology Admission H&P  Chief Complaint: right flank pain  History of Present Illness: Kathryn Kent is a 81yo with a hx of 80m right distal ureteral calculus who has failed MET  Past Medical History:  Diagnosis Date  . [place]   . Anemia   . Atrial fibrillation ([place]   . Chicken pox   . Colon polyps   . Coronary artery disease    status post cardiac catheterization performed 10/16/05 revealing noncritical CAD  . Diverticulosis   . Fibroid tumor   . Heart murmur   . Hyperlipidemia   . Hypertension   . IFG (impaired fasting glucose)   . OSA (obstructive sleep apnea)   . Osteoarthritis   . Shingles    Past Surgical History:  Procedure Laterality Date  . APPENDECTOMY    . cyst removed     abdomen  . great toe surgery Bilateral 1987    Home Medications:  Prescriptions Prior to Admission  Medication Sig Dispense Refill Last Dose  . amLODipine (NORVASC) 10 MG tablet Take 10 mg by mouth daily.   01/03/2017 at 0900  . Ascorbic Acid (VITAMIN C) 1000 MG tablet Take 500 mg by mouth daily.    01/03/2017 at 0900  . chlorthalidone (HYGROTON) 25 MG tablet Take 0.5 tablets by mouth daily.   01/03/2017 at 0900  . Cholecalciferol (VITAMIN D3) 2000 UNITS capsule Take 2,000 Units by mouth daily.   01/03/2017 at 0900  . HYDROcodone-acetaminophen (NORCO/VICODIN) 5-325 MG tablet Take 1 tablet by mouth every 8 (eight) hours as needed for moderate pain or severe pain.   12/30/2016 at Unknown time  . Multiple Vitamin (MULTIVITAMIN) tablet Take 1 tablet by mouth daily.   01/03/2017 at 0900  . valsartan (DIOVAN) 320 MG tablet Take 1 tablet by mouth daily.   01/03/2017 at 0900  . acetaminophen (TYLENOL) 500 MG tablet Take 1,000 mg by mouth every 6 (six) hours as needed for mild pain.   More than a month at Unknown time  . Cod Liver Oil CAPS Take 1 capsule by mouth daily.   More than a month at Unknown time  . loratadine (ALLERGY RELIEF) 10 MG tablet Take 10 mg by mouth daily.   More than a month at Unknown  time  . simvastatin (ZOCOR) 40 MG tablet Take 40 mg by mouth daily.    More than a month at Unknown time   Allergies:  Allergies  Allergen Reactions  . Lipitor [Atorvastatin]     Stomach pains, heart attack sx's    Family History  Problem Relation Age of Onset  . Prostate cancer Father        ?  .Marland KitchenAlcohol abuse Mother   . Aneurysm Brother        heart  . Stroke Sister   . Breast cancer Sister 725 . Lung cancer Brother    Social History:  reports that she has never smoked. She has never used smokeless tobacco. She reports that she does not drink alcohol or use drugs.  Review of Systems  Genitourinary: Positive for flank pain and urgency.  All other systems reviewed and are negative.   Physical Exam:  Vital signs in last 24 hours: Temp:  [98.3 F (36.8 C)] 98.3 F (36.8 C) (07/12 1014) Pulse Rate:  [70] 70 (07/12 1014) Resp:  [16] 16 (07/12 1014) BP: (138)/(80) 138/80 (07/12 1014) SpO2:  [100 %] 100 % (07/12 1014) Weight:  [89.1 kg (196 lb 8 oz)] 89.1 kg (196 lb  8 oz) (07/12 1014) Physical Exam  Constitutional: She is oriented to person, place, and time. She appears well-developed and well-nourished.  HENT:  Head: Normocephalic and atraumatic.  Eyes: Pupils are equal, round, and reactive to light. EOM are normal.  Neck: Normal range of motion. No thyromegaly present.  Cardiovascular: Normal rate and regular rhythm.   Respiratory: Effort normal. No respiratory distress.  GI: Soft. She exhibits no distension.  Musculoskeletal: Normal range of motion. She exhibits no edema.  Neurological: She is alert and oriented to person, place, and time.  Skin: Skin is warm and dry.  Psychiatric: She has a normal mood and affect. Her behavior is normal. Judgment and thought content normal.    Laboratory Data:  Results for orders placed or performed during the hospital encounter of 01/03/17 (from the past 24 hour(s))  I-STAT, chem 8     Status: Abnormal   Collection Time:  01/03/17 11:43 AM  Result Value Ref Range   Sodium 141 135 - 145 mmol/L   Potassium 3.5 3.5 - 5.1 mmol/L   Chloride 100 (L) 101 - 111 mmol/L   BUN 13 6 - 20 mg/dL   Creatinine, Ser 0.70 0.44 - 1.00 mg/dL   Glucose, Bld 115 (H) 65 - 99 mg/dL   Calcium, Ion 1.35 1.15 - 1.40 mmol/L   TCO2 28 0 - 100 mmol/L   Hemoglobin 13.9 12.0 - 15.0 g/dL   HCT 41.0 36.0 - 46.0 %   No results found for this or any previous visit (from the past 240 hour(s)). Creatinine:  Recent Labs  01/01/17 2010 01/03/17 1143  CREATININE 0.90 0.70   Baseline Creatinine: 0.8  Impression/Assessment:  80yo with R distal ureteral calculus  Plan:  The risks/benefits/alternatives to R URS was explaiend to the patient and she understands and wishes to proceed with surgery  Nicolette Bang 01/03/2017, 2:20 PM

## 2017-01-03 NOTE — Anesthesia Postprocedure Evaluation (Signed)
Anesthesia Post Note  Patient: [name]  Procedure(s) Performed: Procedure(s) (LRB): CYSTOSCOPY WITH RETROGRADE PYELOGRAM/ URETEROSCOPY/ STONE BASKETRY/URETERAL STENT PLACEMENT (Right) HOLMIUM LASER APPLICATION (Right)     Patient location during evaluation: PACU Anesthesia Type: General Level of consciousness: awake and alert Pain management: pain level controlled Vital Signs Assessment: post-procedure vital signs reviewed and stable Respiratory status: spontaneous breathing, nonlabored ventilation and respiratory function stable Cardiovascular status: blood pressure returned to baseline and stable Postop Assessment: no signs of nausea or vomiting Anesthetic complications: no    Last Vitals:  Vitals:   01/03/17 1530 01/03/17 1545  BP: 131/77   Pulse: 79 78  Resp: 16 16  Temp:      Last Pain:  Vitals:   01/03/17 1014  TempSrc: Oral                 Lynda Rainwater

## 2017-01-03 NOTE — Anesthesia Procedure Notes (Signed)
Procedure Name: LMA Insertion Date/Time: 01/03/2017 2:33 PM Performed by: Wanita Chamberlain Pre-anesthesia Checklist: Patient identified, Timeout performed, Emergency Drugs available, Suction available and Patient being monitored Patient Re-evaluated:Patient Re-evaluated prior to induction Oxygen Delivery Method: Circle system utilized Preoxygenation: Pre-oxygenation with 100% oxygen Induction Type: IV induction Ventilation: Mask ventilation without difficulty LMA: LMA inserted LMA Size: 4.0 Number of attempts: 1 Placement Confirmation: breath sounds checked- equal and bilateral and positive ETCO2 Tube secured with: Tape Dental Injury: Teeth and Oropharynx as per pre-operative assessment

## 2017-01-03 NOTE — Discharge Instructions (Signed)
Ureteral Stent Implantation, Care After °Refer to this sheet in the next few weeks. These instructions provide you with information about caring for yourself after your procedure. Your health care provider may also give you more specific instructions. Your treatment has been planned according to current medical practices, but problems sometimes occur. Call your health care provider if you have any problems or questions after your procedure. °What can I expect after the procedure? °After the procedure, it is common to have: °· Nausea. °· Mild pain when you urinate. You may feel this pain in your lower back or lower abdomen. Pain should stop within a few minutes after you urinate. This may last for up to 1 week. °· A small amount of blood in your urine for several days. ° °Follow these instructions at home: ° °Medicines °· Take over-the-counter and prescription medicines only as told by your health care provider. °· If you were prescribed an antibiotic medicine, take it as told by your health care provider. Do not stop taking the antibiotic even if you start to feel better. °· Do not drive for 24 hours if you received a sedative. °· Do not drive or operate heavy machinery while taking prescription pain medicines. °Activity °· Return to your normal activities as told by your health care provider. Ask your health care provider what activities are safe for you. °· Do not lift anything that is heavier than 10 lb (4.5 kg). Follow this limit for 1 week after your procedure, or for as long as told by your health care provider. °General instructions °· Watch for any blood in your urine. Call your health care provider if the amount of blood in your urine increases. °· If you have a catheter: °? Follow instructions from your health care provider about taking care of your catheter and collection bag. °? Do not take baths, swim, or use a hot tub until your health care provider approves. °· Drink enough fluid to keep your urine  clear or pale yellow. °· Keep all follow-up visits as told by your health care provider. This is important. °Contact a health care provider if: °· You have pain that gets worse or does not get better with medicine, especially pain when you urinate. °· You have difficulty urinating. °· You feel nauseous or you vomit repeatedly during a period of more than 2 days after the procedure. °Get help right away if: °· Your urine is dark red or has blood clots in it. °· You are leaking urine (have incontinence). °· The end of the stent comes out of your urethra. °· You cannot urinate. °· You have sudden, sharp, or severe pain in your abdomen or lower back. °· You have a fever. °This information is not intended to replace advice given to you by your health care provider. Make sure you discuss any questions you have with your health care provider. °Document Released: 02/11/2013 Document Revised: 11/17/2015 Document Reviewed: 12/24/2014 °Elsevier Interactive Patient Education © 2018 [employer] ° ° °Post Anesthesia Home Care Instructions ° °Activity: °Get plenty of rest for the remainder of the day. A responsible individual must stay with you for 24 hours following the procedure.  °For the next 24 hours, DO NOT: °-Drive a car °-Operate machinery °-Drink alcoholic beverages °-Take any medication unless instructed by your physician °-Make any legal decisions or sign important papers. ° °Meals: °Start with liquid foods such as gelatin or soup. Progress to regular foods as tolerated. Avoid greasy, spicy, heavy foods. If nausea   and/or vomiting occur, drink only clear liquids until the nausea and/or vomiting subsides. Call your physician if vomiting continues. ° °Special Instructions/Symptoms: °Your throat may feel dry or sore from the anesthesia or the breathing tube placed in your throat during surgery. If this causes discomfort, gargle with warm salt water. The discomfort should disappear within 24 hours. ° °If you had a  scopolamine patch placed behind your ear for the management of post- operative nausea and/or vomiting: ° °1. The medication in the patch is effective for 72 hours, after which it should be removed.  Wrap patch in a tissue and discard in the trash. Wash hands thoroughly with soap and water. °2. You may remove the patch earlier than 72 hours if you experience unpleasant side effects which may include dry mouth, dizziness or visual disturbances. °3. Avoid touching the patch. Wash your hands with soap and water after contact with the patch. °  ° °

## 2017-01-03 NOTE — Op Note (Signed)
Preoperative diagnosis: Right ureteral stone  Postoperative diagnosis: Same  Procedure: 1 cystoscopy 2 right retrograde pyelography 3.  Intraoperative fluoroscopy, under one hour, with interpretation 4.  Right ureteroscopic stone manipulation with laser lithotripsy 5.  Right 6 x 24 JJ stent placement  Attending: Rosie Fate  Anesthesia: General  Estimated blood loss: None  Drains: Right 6 x 24 JJ ureteral stent without tether  Specimens: stone for analysis  Antibiotics: ancef  Findings: right mid ureteral calculus. Mild hydronephrosis.  Indications: Patient is a 81 year old female/female with a history of ureteral stone and who has failed medical expulsive therapy.  After discussing treatment options, she decided proceed with right ureteroscopic stone manipulation.  Procedure her in detail: The patient was brought to the operating room and a brief timeout was done to ensure correct patient, correct procedure, correct site.  General anesthesia was administered patient was placed in dorsal lithotomy position.  Her genitalia was then prepped and draped in usual sterile fashion.  A rigid 42 French cystoscope was passed in the urethra and the bladder.  Bladder was inspected free masses or lesions.  the right ureteral orifices were in the normal orthotopic locations.  a 6 french ureteral catheter was then instilled into the right ureter orifice.  a gentle retrograde was obtained and findings noted above.  we then placed a zip wire through the ureteral catheter and advanced up to the renal pelvis.  we then removed the cystoscope and cannulated the right ureteral orifice with a semirigid ureteroscope.  we then encountered the stone in the mid ureter.  using using a 200 nm laser fiber and fragmented the stone into smaller pieces.  the pieces were then removed with a engage basket.  We then placed and good coil was noted in the the renal pelvis under fluoroscopy and the bladder under direct  vision.   once all stone fragments were removed we then placed a 6 x 24 double-j ureteral stent over the original zip wire.  the stone fragments were then removed from the bladder and sent for analysis.   the bladder was then drained and this concluded the procedure which was well tolerated by patient.  Complications: None  Condition: Stable, extubated, transferred to PACU  Plan: Patient is to be discharged home as to follow-up in one week for stent removal.

## 2017-01-03 NOTE — Transfer of Care (Signed)
Immediate Anesthesia Transfer of Care Note  Patient: HASET OAXACA  Procedure(s) Performed: Procedure(s): CYSTOSCOPY WITH RETROGRADE PYELOGRAM/ URETEROSCOPY/ STONE BASKETRY/URETERAL STENT PLACEMENT (Right) HOLMIUM LASER APPLICATION (Right)  Patient Location: PACU  Anesthesia Type:General  Level of Consciousness: awake, alert , oriented and patient cooperative  Airway & Oxygen Therapy: Patient Spontanous Breathing and Patient connected to nasal cannula oxygen  Post-op Assessment: Report given to RN and Post -op Vital signs reviewed and stable  Post vital signs: Reviewed and stable  Last Vitals:  Vitals:   01/03/17 1014  BP: 138/80  Pulse: 70  Resp: 16  Temp: 36.8 C    Last Pain:  Vitals:   01/03/17 1014  TempSrc: Oral      Patients Stated Pain Goal: 8 (09/62/83 6629)  Complications: No apparent anesthesia complications

## 2017-01-04 ENCOUNTER — Encounter (HOSPITAL_BASED_OUTPATIENT_CLINIC_OR_DEPARTMENT_OTHER): Payer: Self-pay | Admitting: Urology

## 2017-01-07 ENCOUNTER — Ambulatory Visit
Admission: RE | Admit: 2017-01-07 | Discharge: 2017-01-07 | Disposition: A | Discharge status: Home / Self Care | Payer: Medicare Other | Source: Ambulatory Visit | Attending: Internal Medicine | Admitting: Internal Medicine

## 2017-01-07 DIAGNOSIS — Z1231 Encounter for screening mammogram for malignant neoplasm of breast: Secondary | ICD-10-CM

## 2017-03-18 ENCOUNTER — Encounter: Payer: Self-pay | Admitting: Internal Medicine

## 2017-03-29 ENCOUNTER — Encounter: Payer: Self-pay | Admitting: Internal Medicine

## 2017-05-20 ENCOUNTER — Ambulatory Visit (AMBULATORY_SURGERY_CENTER): Payer: Self-pay

## 2017-05-20 VITALS — Ht 66.0 in | Wt 215.6 lb

## 2017-05-20 DIAGNOSIS — Z1211 Encounter for screening for malignant neoplasm of colon: Secondary | ICD-10-CM

## 2017-05-20 MED ORDER — SUPREP BOWEL PREP KIT 17.5-3.13-1.6 GM/177ML PO SOLN: 1.0000 | Freq: Once | ORAL | 0 refills | Status: AC

## 2017-05-20 NOTE — Progress Notes (Signed)
No allergies to eggs or soy No diet meds No past problems with anesthesia Cpap only  Declined emmi

## 2017-05-21 ENCOUNTER — Encounter: Payer: Self-pay | Admitting: Internal Medicine

## 2017-05-31 ENCOUNTER — Telehealth: Payer: Self-pay | Admitting: Internal Medicine

## 2017-06-03 ENCOUNTER — Encounter: Payer: Medicare Other | Admitting: Internal Medicine

## 2017-06-24 ENCOUNTER — Other Ambulatory Visit: Payer: Self-pay

## 2017-06-24 ENCOUNTER — Encounter: Payer: Self-pay | Admitting: Internal Medicine

## 2017-06-24 ENCOUNTER — Ambulatory Visit (AMBULATORY_SURGERY_CENTER): Payer: Medicare Other | Admitting: Internal Medicine

## 2017-06-24 VITALS — BP 120/64 | HR 61 | Temp 99.1°F | Resp 14 | Ht 65.0 in | Wt 213.0 lb

## 2017-06-24 DIAGNOSIS — D123 Benign neoplasm of transverse colon: Secondary | ICD-10-CM | POA: Diagnosis not present

## 2017-06-24 DIAGNOSIS — D122 Benign neoplasm of ascending colon: Secondary | ICD-10-CM | POA: Diagnosis not present

## 2017-06-24 DIAGNOSIS — Z1211 Encounter for screening for malignant neoplasm of colon: Secondary | ICD-10-CM

## 2017-06-24 DIAGNOSIS — D120 Benign neoplasm of cecum: Secondary | ICD-10-CM

## 2017-06-24 HISTORY — PX: COLONOSCOPY: SHX174

## 2017-06-24 MED ORDER — SODIUM CHLORIDE 0.9 % IV SOLN: 500.0000 mL | Freq: Once | INTRAVENOUS | Status: DC

## 2017-06-24 NOTE — Progress Notes (Signed)
Called to room to assist during endoscopic procedure.  Patient ID and intended procedure confirmed with present staff. Received instructions for my participation in the procedure from the performing physician.  

## 2017-06-24 NOTE — Progress Notes (Signed)
A/ox3 pleased with MAC, report to Angela RN 

## 2017-06-24 NOTE — Patient Instructions (Signed)
**Handout given to patient on polyps**   YOU HAD AN ENDOSCOPIC PROCEDURE TODAY AT [facility]:   Refer to the procedure report that was given to you for any specific questions about what was found during the examination.  If the procedure report does not answer your questions, please call your gastroenterologist to clarify.  If you requested that your care partner not be given the details of your procedure findings, then the procedure report has been included in a sealed envelope for you to review at your convenience later.  YOU SHOULD EXPECT: Some feelings of bloating in the abdomen. Passage of more gas than usual.  Walking can help get rid of the air that was put into your GI tract during the procedure and reduce the bloating. If you had a lower endoscopy (such as a colonoscopy or flexible sigmoidoscopy) you may notice spotting of blood in your stool or on the toilet paper. If you underwent a bowel prep for your procedure, you may not have a normal bowel movement for a few days.  Please Note:  You might notice some irritation and congestion in your nose or some drainage.  This is from the oxygen used during your procedure.  There is no need for concern and it should clear up in a day or so.  SYMPTOMS TO REPORT IMMEDIATELY:   Following lower endoscopy (colonoscopy or flexible sigmoidoscopy):  Excessive amounts of blood in the stool  Significant tenderness or worsening of abdominal pains  Swelling of the abdomen that is new, acute  Fever of 100F or higher  For urgent or emergent issues, a gastroenterologist can be reached at any hour by calling [phone].   DIET:  We do recommend a small meal at first, but then you may proceed to your regular diet.  Drink plenty of fluids but you should avoid alcoholic beverages for 24 hours.  ACTIVITY:  You should plan to take it easy for the rest of today and you should NOT DRIVE or use heavy machinery until tomorrow (because of the  sedation medicines used during the test).    FOLLOW UP: Our staff will call the number listed on your records the next business day following your procedure to check on you and address any questions or concerns that you may have regarding the information given to you following your procedure. If we do not reach you, we will leave a message.  However, if you are feeling well and you are not experiencing any problems, there is no need to return our call.  We will assume that you have returned to your regular daily activities without incident.  If any biopsies were taken you will be contacted by phone or by letter within the next 1-3 weeks.  Please call us at [phone] if you have not heard about the biopsies in 3 weeks.    SIGNATURES/CONFIDENTIALITY: You and/or your care partner have signed paperwork which will be entered into your electronic medical record.  These signatures attest to the fact that that the information above on your After Visit Summary has been reviewed and is understood.  Full responsibility of the confidentiality of this discharge information lies with you and/or your care-partner.YOU HAD AN ENDOSCOPIC PROCEDURE TODAY AT [facility]:   Refer to the procedure report that was given to you for any specific questions about what was found during the examination.  If the procedure report does not answer your questions, please call your gastroenterologist to clarify.  If you requested that your care partner not be given the details of your procedure findings, then the procedure report has been included in a sealed envelope for you to review at your convenience later.  YOU SHOULD EXPECT: Some feelings of bloating in the abdomen. Passage of more gas than usual.  Walking can help get rid of the air that was put into your GI tract during the procedure and reduce the bloating. If you had a lower endoscopy (such as a colonoscopy or flexible sigmoidoscopy) you may notice spotting  of blood in your stool or on the toilet paper. If you underwent a bowel prep for your procedure, you may not have a normal bowel movement for a few days.  Please Note:  You might notice some irritation and congestion in your nose or some drainage.  This is from the oxygen used during your procedure.  There is no need for concern and it should clear up in a day or so.  SYMPTOMS TO REPORT IMMEDIATELY:   Following lower endoscopy (colonoscopy or flexible sigmoidoscopy):  Excessive amounts of blood in the stool  Significant tenderness or worsening of abdominal pains  Swelling of the abdomen that is new, acute  Fever of 100F or higher   For urgent or emergent issues, a gastroenterologist can be reached at any hour by calling [phone].   DIET:  We do recommend a small meal at first, but then you may proceed to your regular diet.  Drink plenty of fluids but you should avoid alcoholic beverages for 24 hours.  ACTIVITY:  You should plan to take it easy for the rest of today and you should NOT DRIVE or use heavy machinery until tomorrow (because of the sedation medicines used during the test).    FOLLOW UP: Our staff will call the number listed on your records the next business day following your procedure to check on you and address any questions or concerns that you may have regarding the information given to you following your procedure. If we do not reach you, we will leave a message.  However, if you are feeling well and you are not experiencing any problems, there is no need to return our call.  We will assume that you have returned to your regular daily activities without incident.  If any biopsies were taken you will be contacted by phone or by letter within the next 1-3 weeks.  Please call us at [phone] if you have not heard about the biopsies in 3 weeks.    SIGNATURES/CONFIDENTIALITY: You and/or your care partner have signed paperwork which will be entered into your  electronic medical record.  These signatures attest to the fact that that the information above on your After Visit Summary has been reviewed and is understood.  Full responsibility of the confidentiality of this discharge information lies with you and/or your care-partner.

## 2017-06-24 NOTE — Op Note (Signed)
[name] Procedure Date: 06/24/2017 9:20 AM MRN: [id number] Endoscopist: Docia Chuck. Henrene Pastor , MD Age: 81 Referring MD:  Date of Birth: 03/12/1936 Gender: Female Account #: [id number] Procedure:                Colonoscopy with cold snare polypectomy x 4 Indications:              Screening for colorectal malignant neoplasm.                            Previous examinations 2003 and 2008 negative for                            neoplasia Medicines:                Monitored Anesthesia Care Procedure:                Pre-Anesthesia Assessment:                           - Prior to the procedure, a History and Physical                            was performed, and patient medications and                            allergies were reviewed. The patient's tolerance of                            previous anesthesia was also reviewed. The risks                            and benefits of the procedure and the sedation                            options and risks were discussed with the patient.                            All questions were answered, and informed consent                            was obtained. Prior Anticoagulants: The patient has                            taken no previous anticoagulant or antiplatelet                            agents. ASA Grade Assessment: II - A patient with                            mild systemic disease. After reviewing the risks                            and benefits, the patient was deemed in  satisfactory condition to undergo the procedure.                           After obtaining informed consent, the colonoscope                            was passed under direct vision. Throughout the                            procedure, the patient's blood pressure, pulse, and                            oxygen saturations were monitored continuously. The                            Colonoscope was  introduced through the anus and                            advanced to the the cecum, identified by                            appendiceal orifice and ileocecal valve. The                            ileocecal valve, appendiceal orifice, and rectum                            were photographed. The quality of the bowel                            preparation was good. The colonoscopy was performed                            without difficulty. The patient tolerated the                            procedure well. The bowel preparation used was                            SUPREP. Scope In: 9:27:17 AM Scope Out: 9:43:00 AM Scope Withdrawal Time: 0 hours 12 minutes 55 seconds  Total Procedure Duration: 0 hours 15 minutes 43 seconds  Findings:                 Four polyps were found in the transverse colon,                            ascending colon and cecum. The polyps were 2 mm in                            size. These polyps were removed with a cold snare.                            Resection and retrieval were complete.  Multiple small and large-mouthed diverticula were                            found in the left colon and right colon.                           The exam was otherwise without abnormality on                            direct and retroflexion views. Hypertrophic anal                            papilla present. Complications:            No immediate complications. Estimated blood loss:                            None. Estimated Blood Loss:     Estimated blood loss: none. Impression:               - Four 2 mm polyps in the transverse colon, in the                            ascending colon and in the cecum, removed with a                            cold snare. Resected and retrieved.                           - Diverticulosis in the left colon and in the right                            colon.                           - The examination was otherwise  normal on direct                            and retroflexion views. Recommendation:           - Repeat colonoscopy is not recommended for                            surveillance, given current age and examination                            result.                           - Patient has a contact number available for                            emergencies. The signs and symptoms of potential                            delayed complications were discussed with the  patient. Return to normal activities tomorrow.                            Written discharge instructions were provided to the                            patient.                           - Resume previous diet.                           - Continue present medications.                           - Await pathology results. Docia Chuck. Henrene Pastor, MD 06/24/2017 9:48:28 AM This report has been signed electronically.

## 2017-06-26 ENCOUNTER — Telehealth: Payer: Self-pay | Admitting: *Deleted

## 2017-06-26 NOTE — Telephone Encounter (Signed)
  Follow up Call-  Call back number 06/24/2017  Post procedure Call Back phone  # [phone], [phone]  Permission to leave phone message Yes  Some recent data might be hidden     Patient questions:  Do you have a fever, pain , or abdominal swelling? No. Pain Score  0 *  Have you tolerated food without any problems? Yes.    Have you been able to return to your normal activities? Yes.    Do you have any questions about your discharge instructions: Diet   No. Medications  No. Follow up visit  No.  Do you have questions or concerns about your Care? No.  Actions: * If pain score is 4 or above: No action needed, pain <4.

## 2017-06-28 ENCOUNTER — Encounter: Payer: Self-pay | Admitting: Internal Medicine

## 2017-09-10 DIAGNOSIS — I10 Essential (primary) hypertension: Secondary | ICD-10-CM | POA: Diagnosis not present

## 2017-09-10 DIAGNOSIS — M1990 Unspecified osteoarthritis, unspecified site: Secondary | ICD-10-CM | POA: Diagnosis not present

## 2017-09-10 DIAGNOSIS — Z6834 Body mass index (BMI) 34.0-34.9, adult: Secondary | ICD-10-CM | POA: Diagnosis not present

## 2017-09-10 DIAGNOSIS — G4733 Obstructive sleep apnea (adult) (pediatric): Secondary | ICD-10-CM | POA: Diagnosis not present

## 2017-09-10 DIAGNOSIS — E1165 Type 2 diabetes mellitus with hyperglycemia: Secondary | ICD-10-CM | POA: Diagnosis not present

## 2017-10-14 DIAGNOSIS — N201 Calculus of ureter: Secondary | ICD-10-CM | POA: Diagnosis not present

## 2017-10-30 ENCOUNTER — Ambulatory Visit (INDEPENDENT_AMBULATORY_CARE_PROVIDER_SITE_OTHER): Payer: Medicare Other | Admitting: Obstetrics & Gynecology

## 2017-10-30 ENCOUNTER — Encounter: Payer: Self-pay | Admitting: Obstetrics & Gynecology

## 2017-10-30 VITALS — BP 134/80 | HR 70 | Resp 18 | Ht 64.5 in | Wt 211.6 lb

## 2017-10-30 DIAGNOSIS — L723 Sebaceous cyst: Secondary | ICD-10-CM

## 2017-10-30 DIAGNOSIS — D251 Intramural leiomyoma of uterus: Secondary | ICD-10-CM

## 2017-10-30 NOTE — Progress Notes (Signed)
82 y.o. G1P0010 WidowedAfrican AmericanF here for follow up visit due to fibroids.  Denies any vaginal bleeding.  Is aware of hx of significant uterine fibroids.    Reports she had an sore place on her labia about one to two months ago.  It was tender and drained about a month ago.  She does want me to check this today.  No LMP recorded. Patient is postmenopausal.          Sexually active: No.  The current method of family planning is post menopausal status.    Exercising: Yes.    walks up and down stairs and yard work. Smoker:  no  Health Maintenance: Pap:  09/05/15 Neg  History of abnormal Pap:  no MMG:  01/08/17 BIRADS1:Neg  Colonoscopy:  06/24/17 Polyps BMD:  Years ago with Dr.Perini--normal TDaP:  With PCP Pneumonia vaccine(s):  Yes Shingrix:   Pt. declines Hep C testing: no Screening Labs: PCP, Hb today: PCP   reports that she has never smoked. She has never used smokeless tobacco. She reports that she does not drink alcohol or use drugs.  Past Medical History:  Diagnosis Date  . Alcoholism ([place])   . Anemia   . Atrial fibrillation ([place])   . Chicken pox   . Colon polyps   . Coronary artery disease    status post cardiac catheterization performed 10/16/05 revealing noncritical CAD  . Diverticulosis   . Fibroid tumor   . Heart murmur   . Hyperlipidemia   . Hypertension   . IFG (impaired fasting glucose)   . Nephrolithiasis   . OSA (obstructive sleep apnea)    CPAP nightly  . Osteoarthritis   . Shingles     Past Surgical History:  Procedure Laterality Date  . APPENDECTOMY    . cyst removed     abdomen  . CYSTOSCOPY W/ URETERAL STENT PLACEMENT Right 01/03/2017   Procedure: CYSTOSCOPY WITH RETROGRADE PYELOGRAM/ URETEROSCOPY/ STONE BASKETRY/URETERAL STENT PLACEMENT;  Surgeon: Cleon Gustin, MD;  Location: [facility];  Service: Urology;  Laterality: Right;  . great toe surgery Bilateral 1987  . HOLMIUM LASER APPLICATION Right 02/09/5630    Procedure: HOLMIUM LASER APPLICATION;  Surgeon: Cleon Gustin, MD;  Location: [facility];  Service: Urology;  Laterality: Right;    Current Outpatient Medications  Medication Sig Dispense Refill  . acetaminophen (TYLENOL) 500 MG tablet Take 1,000 mg by mouth every 6 (six) hours as needed for mild pain.    Marland Kitchen amLODipine (NORVASC) 10 MG tablet Take 10 mg by mouth daily.    . Ascorbic Acid (VITAMIN C) 1000 MG tablet Take 500 mg by mouth daily.     . chlorthalidone (HYGROTON) 25 MG tablet Take 0.5 tablets by mouth daily.    . Cholecalciferol (VITAMIN D3) 2000 UNITS capsule Take 2,000 Units by mouth daily.    Marland Kitchen Cod Liver Oil CAPS Take 1 capsule by mouth daily.    Marland Kitchen HYDROcodone-acetaminophen (NORCO/VICODIN) 5-325 MG tablet Take 1 tablet by mouth every 8 (eight) hours as needed for moderate pain or severe pain. (Patient not taking: Reported on 06/24/2017) 30 tablet 0  . loratadine (ALLERGY RELIEF) 10 MG tablet Take 10 mg by mouth daily.    . Multiple Vitamin (MULTIVITAMIN) tablet Take 1 tablet by mouth daily.    . simvastatin (ZOCOR) 40 MG tablet Take 40 mg by mouth daily.     . valsartan (DIOVAN) 320 MG tablet Take 1 tablet by mouth daily.  No current facility-administered medications for this visit.     Family History  Problem Relation Age of Onset  . Prostate cancer Father        ?  Marland Kitchen Alcohol abuse Mother   . Aneurysm Brother        heart  . Stroke Sister   . Breast cancer Sister 44  . Lung cancer Brother   . Colon cancer Neg Hx     Review of Systems  Constitutional: Negative.   HENT: Negative.   Eyes: Negative.   Respiratory: Negative.   Cardiovascular: Negative.   Gastrointestinal: Negative.   Genitourinary: Negative.   Musculoskeletal: Negative.   Skin: Negative.   Endo/Heme/Allergies: Negative.   Psychiatric/Behavioral: Negative.   All other systems reviewed and are negative.   Exam:   Vitals:   10/30/17 1040  BP: 134/80  Pulse: 70  Resp:  18    General appearance: alert, cooperative and appears stated age Abdomen: soft, non-tender; bowel sounds normal; no masses,  no organomegaly Neurologic: Grossly normal Lymph:  No inguinal LAD  Pelvic: External genitalia:  Two sebaceous cysts noted on left labia majora, large is one that ruptured about a month ago              Urethra:  normal appearing urethra with no masses, tenderness or lesions              Bartholins and Skenes: normal                 Vagina: normal appearing vagina with normal color and discharge, no lesions              Cervix: no lesions              Pap taken: No. Bimanual Exam:  Uterus:  enlarged, 12-14 weeks size, globular d/w fibroids              Adnexa: normal adnexa               Rectovaginal: Confirms               Anus:  normal sphincter tone, no lesions  Chaperone was present for exam.  A:  Fibroid uterus, stable H/O vulvar sebaceous cysts  P:   Pt aware if has any future bleeding, she needs to call. She and I agreed to follow up again in two years. Mammogram guideline recommendations reviewed.  She is going to do these every other year from now on.

## 2017-11-28 DIAGNOSIS — M170 Bilateral primary osteoarthritis of knee: Secondary | ICD-10-CM | POA: Diagnosis not present

## 2017-11-28 DIAGNOSIS — M1710 Unilateral primary osteoarthritis, unspecified knee: Secondary | ICD-10-CM | POA: Diagnosis present

## 2017-11-28 DIAGNOSIS — R011 Cardiac murmur, unspecified: Secondary | ICD-10-CM | POA: Insufficient documentation

## 2017-11-28 DIAGNOSIS — M25561 Pain in right knee: Secondary | ICD-10-CM | POA: Diagnosis not present

## 2017-11-28 DIAGNOSIS — M25562 Pain in left knee: Secondary | ICD-10-CM | POA: Diagnosis not present

## 2017-11-28 DIAGNOSIS — M179 Osteoarthritis of knee, unspecified: Secondary | ICD-10-CM | POA: Diagnosis present

## 2017-12-03 ENCOUNTER — Telehealth: Payer: Self-pay

## 2017-12-03 NOTE — Telephone Encounter (Signed)
   Primary Cardiologist:Jonathan Gwenlyn Found, MD  Chart reviewed as part of pre-operative protocol coverage. Because of SAREEN RANDON past medical history and time since last visit, he/she will require a follow-up visit in order to better assess preoperative cardiovascular risk.  Pre-op covering staff: - Please schedule appointment and call patient to inform them. - Please contact requesting surgeon's office via preferred method (i.e, phone, fax) to inform them of need for appointment prior to surgery.  Lyda Jester, PA-C  12/03/2017, 3:12 PM

## 2017-12-03 NOTE — Telephone Encounter (Signed)
   [facility] Pre-operative Risk Assessment    Request for surgical clearance:  1. What type of surgery is being performed?  LEFT TOTAL KNEE ARTHROPLASTY  2. When is this surgery scheduled? 7-29- 19  3. What type of clearance is required (medical clearance vs. Pharmacy clearance to hold med vs. Both)? MEDICAL  4. Are there any medications that need to be held prior to surgery and how long?NOT LISTED   5. Practice name and name of physician performing surgery?   [facility] Dr Pilar Plate Aluisio, ATTN: VELVET McBRIDE  6. What is your office phone number [phone]    7.   What is your office fax number [phone]  8.   Anesthesia type (None, local, MAC, general) ? NOT LISTED   Waylan Rocher 12/03/2017, 10:43 AM  _________________________________________________________________   (provider comments below)

## 2017-12-03 NOTE — Telephone Encounter (Signed)
SPOKE WITH PT AND PT AGREED TO APPT 12-04-17 FOR KNEE SURGERY 01-20-18 WITH Sherlyn Hay HAMMOND

## 2017-12-04 ENCOUNTER — Encounter: Payer: Self-pay | Admitting: Cardiology

## 2017-12-04 ENCOUNTER — Ambulatory Visit (INDEPENDENT_AMBULATORY_CARE_PROVIDER_SITE_OTHER): Payer: Medicare Other | Admitting: Cardiology

## 2017-12-04 VITALS — BP 128/70 | HR 92 | Ht 64.5 in | Wt 214.0 lb

## 2017-12-04 DIAGNOSIS — Z01810 Encounter for preprocedural cardiovascular examination: Secondary | ICD-10-CM | POA: Diagnosis not present

## 2017-12-04 DIAGNOSIS — I2510 Atherosclerotic heart disease of native coronary artery without angina pectoris: Secondary | ICD-10-CM

## 2017-12-04 DIAGNOSIS — E7800 Pure hypercholesterolemia, unspecified: Secondary | ICD-10-CM

## 2017-12-04 DIAGNOSIS — I10 Essential (primary) hypertension: Secondary | ICD-10-CM

## 2017-12-04 NOTE — Progress Notes (Signed)
Cardiology Office Note:    Date:  12/04/2017   ID:  Kathryn Kent, DOB 10/08/1935, MRN [id number]  PCP:  Crist Infante, MD  Cardiologist:  Quay Burow, MD  Referring MD: Crist Infante, MD   Chief Complaint  Patient presents with  . Pre-op Exam    History of Present Illness:    Kathryn Kent is a 82 y.o. female with a past medical history significant for non obstructive CAD by cath in 2007, HTN, HLD, OSA on CPAP and being overweight. Cardiac catheterization in 09/2005 revealed scattered 30-40% stenoses in the RCA and LAD. She was last seen in our office by Dr. Gwenlyn Found on 04/11/16 at which time she was noted to be completely asymptomatic.  She is now scheduled for left total knee arthroplasty on 01/20/18.   She is here today with her niece for pre-op evaluation. She denies chest pain/pressue/tightness or shortness of breath. 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 She also works in her yard often. She was planting shrubs yesterday and has had no exertional symptoms. She has occasional fast heart beating for a few seconds when she drinks caffeinated sodas. She does not drink coffee. She has no orthopnea, PND, edema, dizziness or syncope. She uses her CPAP every night, can't sleep without it.    Past Medical History:  Diagnosis Date  . Alcoholism ([place])   . Anemia   . Atrial fibrillation ([place])   . Chicken pox   . Colon polyps   . Coronary artery disease    status post cardiac catheterization performed 10/16/05 revealing noncritical CAD  . Diverticulosis   . Fibroid tumor   . Heart murmur   . Hyperlipidemia   . Hypertension   . IFG (impaired fasting glucose)   . Nephrolithiasis   . OSA (obstructive sleep apnea)    CPAP nightly  . Osteoarthritis   . Shingles     Past Surgical History:  Procedure Laterality Date  . APPENDECTOMY    . cyst removed     abdomen  . CYSTOSCOPY W/ URETERAL STENT PLACEMENT Right 01/03/2017   Procedure:  CYSTOSCOPY WITH RETROGRADE PYELOGRAM/ URETEROSCOPY/ STONE BASKETRY/URETERAL STENT PLACEMENT;  Surgeon: Cleon Gustin, MD;  Location: [facility];  Service: Urology;  Laterality: Right;  . great toe surgery Bilateral 1987  . HOLMIUM LASER APPLICATION Right 8/67/5449   Procedure: HOLMIUM LASER APPLICATION;  Surgeon: Cleon Gustin, MD;  Location: [facility];  Service: Urology;  Laterality: Right;    Current Medications: Current Meds  Medication Sig  . acetaminophen (TYLENOL) 500 MG tablet Take 1,000 mg by mouth every 6 (six) hours as needed for mild pain.  Marland Kitchen amLODipine (NORVASC) 10 MG tablet Take 10 mg by mouth daily.  . Ascorbic Acid (VITAMIN C) 1000 MG tablet Take 500 mg by mouth daily.   . chlorthalidone (HYGROTON) 25 MG tablet Take 0.5 tablets by mouth daily.  . Cholecalciferol (VITAMIN D3) 2000 UNITS capsule Take 2,000 Units by mouth daily.  Marland Kitchen Cod Liver Oil CAPS Take 1 capsule by mouth daily.  Marland Kitchen loratadine (ALLERGY RELIEF) 10 MG tablet Take 10 mg by mouth daily.  . Multiple Vitamin (MULTIVITAMIN) tablet Take 1 tablet by mouth daily.  . simvastatin (ZOCOR) 40 MG tablet Take 40 mg by mouth daily.   . valsartan (DIOVAN) 320 MG tablet Take 1 tablet by mouth daily.     Allergies:   Lipitor [atorvastatin]   Social History  Socioeconomic History  . Marital status: Widowed    Spouse name: Not on file  . Number of children: 0  . Years of education: Not on file  . Highest education level: Not on file  Occupational History  . Occupation: retired  Scientific laboratory technician  . Financial resource strain: Not on file  . Food insecurity:    Worry: Not on file    Inability: Not on file  . Transportation needs:    Medical: Not on file    Non-medical: Not on file  Tobacco Use  . Smoking status: Never Smoker  . Smokeless tobacco: Never Used  Substance and Sexual Activity  . Alcohol use: No  . Drug use: No  . Sexual activity: Not Currently    Birth  control/protection: Post-menopausal  Lifestyle  . Physical activity:    Days per week: Not on file    Minutes per session: Not on file  . Stress: Not on file  Relationships  . Social connections:    Talks on phone: Not on file    Gets together: Not on file    Attends religious service: Not on file    Active member of club or organization: Not on file    Attends meetings of clubs or organizations: Not on file    Relationship status: Not on file  Other Topics Concern  . Not on file  Social History Narrative  . Not on file     Family History: The patient's family history includes Alcohol abuse in her mother; Aneurysm in her brother; Breast cancer (age of onset: 3) in her sister; Lung cancer in her brother; Prostate cancer in her father; Stroke in her sister. There is no history of Colon cancer. ROS:   Please see the history of present illness.     All other systems reviewed and are negative.  EKGs/Labs/Other Studies Reviewed:    The following studies were reviewed today:  None. History as in [place].  EKG:  EKG is  ordered today.  The ekg ordered today demonstrates NSR with minimal criteria for LVH, may be normal variant. Non-specific T changes, similar to previous EKGs.   Recent Labs: 01/03/2017: BUN 13; Creatinine, Ser 0.70; Hemoglobin 13.9; Potassium 3.5; Sodium 141   Recent Lipid Panel No results found for: CHOL, TRIG, HDL, CHOLHDL, VLDL, LDLCALC, LDLDIRECT  Physical Exam:    VS:  BP 128/70   Pulse 92   Ht 5' 4.5" (1.638 m)   Wt 214 lb (97.1 kg)   SpO2 97%   BMI 36.17 kg/m     Wt Readings from Last 3 Encounters:  12/04/17 214 lb (97.1 kg)  10/30/17 211 lb 9.6 oz (96 kg)  06/24/17 213 lb (96.6 kg)     Physical Exam  Constitutional: She is oriented to person, place, and time. She appears well-developed and well-nourished.  HENT:  Head: Normocephalic and atraumatic.  Neck: Normal range of motion. Neck supple. No JVD present.  Cardiovascular: Normal rate, regular  rhythm, normal heart sounds and intact distal pulses. Exam reveals no gallop and no friction rub.  No murmur heard. Pulmonary/Chest: Effort normal and breath sounds normal. No respiratory distress. She has no wheezes. She has no rales.  Abdominal: Soft. Bowel sounds are normal.  Musculoskeletal: Normal range of motion. She exhibits no edema or deformity.  Neurological: She is alert and oriented to person, place, and time.  Skin: Skin is warm and dry.  Psychiatric: She has a normal mood and affect. Her behavior is normal.  Thought content normal.  Vitals reviewed.   ASSESSMENT:    1. Preop cardiovascular exam   2. Coronary artery disease involving native coronary artery of native heart without angina pectoris   3. Essential hypertension   4. Pure hypercholesterolemia    PLAN:    In order of problems listed above:  Pre-operative evaluation: Planned for left total knee arthroplasty on 01/20/18. With no prior history of obstructive CAD or heart failure and no current symptoms, According to the RCRI risk calculator she is a class I risk with 0.4% risk of major cardiac event. She is very active, able to achieve much over 4 Mets, without exertional symptoms. Accordnign to AHA/ACC guidelines she can proceed with the surgery without further cardiac testing.   CAD: nonobstructive CAD by cath in 2007, scattered 30-40% stenoses in the RCA and LAD. No anginal symptoms. She continues on statin and BP control.   Hypertension: amlodipine, Diovan, chlorthalidone. Pt reports recent adjustment in BP meds. BP on arrival 144/90. At end of visit BP 128/70. She reports that she was very nervous about this visit but feels calmer now.  Last labs in Epic were in 12/2016. Pt reports that Dr. Joylene Draft monitors her labs regularly and she had recent labs.  Hyperlipidemia: On simvastatin 40 mg daily. Followed by Dr. Joylene Draft.   I will efax this note to Dr. Hector Shade at [facility]. Attn: Velvet McBride   Medication  Adjustments/Labs and Tests Ordered: Current medicines are reviewed at length with the patient today.  Concerns regarding medicines are outlined above. Labs and tests ordered and medication changes are outlined in the patient instructions below:  Patient Instructions  Medication Instructions: Your physician recommends that you continue on your current medications as directed. Please refer to the Current Medication list given to you today.   Labwork: None   Procedures/Testing: None  Follow-Up: Your physician recommends that you schedule a follow-up appointment in: 4-6 months with Dr.Berry    Any Additional Special Instructions Will Be Listed Below (If Applicable).     If you need a refill on your cardiac medications before your next appointment, please call your pharmacy.       Signed, Daune Perch, NP  12/04/2017 5:04 PM    [facility]

## 2017-12-04 NOTE — Patient Instructions (Signed)
Medication Instructions: Your physician recommends that you continue on your current medications as directed. Please refer to the Current Medication list given to you today.   Labwork: None   Procedures/Testing: None  Follow-Up: Your physician recommends that you schedule a follow-up appointment in: 4-6 months with Dr.Berry    Any Additional Special Instructions Will Be Listed Below (If Applicable).     If you need a refill on your cardiac medications before your next appointment, please call your pharmacy.

## 2017-12-25 DIAGNOSIS — M25561 Pain in right knee: Secondary | ICD-10-CM | POA: Diagnosis not present

## 2018-01-02 NOTE — H&P (Signed)
TOTAL KNEE ADMISSION H&P  Patient is being admitted for right total knee arthroplasty.  Subjective:  Chief Complaint:right knee pain.  HPI: Kathryn Kent, 82 y.o. female, has a history of pain and functional disability in the right knee due to arthritis and has failed non-surgical conservative treatments for greater than 12 weeks to include corticosteriod injections and activity modification.  Onset of symptoms was gradual, starting >10 years ago with gradually worsening course since that time. The patient noted no past surgery on the right knee(s).  Patient currently rates pain in the right knee(s) at 3 out of 10 with activity. Patient has pain that interferes with activities of daily living and joint swelling.  Patient has evidence of bone-on-bone arthritis, lateral and patellofemoral in the right knee by imaging studies. There is no active infection.  Patient Active Problem List   Diagnosis Date Noted  . Intramural leiomyoma of uterus 09/12/2014  . Sebaceous cyst of labia 09/12/2014  . Essential hypertension 05/28/2013  . Hyperlipidemia 05/28/2013  . Coronary artery disease 05/28/2013  . Heart murmur    Past Medical History:  Diagnosis Date  . Alcoholism ([place])   . Anemia   . Atrial fibrillation ([place])   . Chicken pox   . Colon polyps   . Coronary artery disease    status post cardiac catheterization performed 10/16/05 revealing noncritical CAD  . Diverticulosis   . Fibroid tumor   . Heart murmur   . Hyperlipidemia   . Hypertension   . IFG (impaired fasting glucose)   . Nephrolithiasis   . OSA (obstructive sleep apnea)    CPAP nightly  . Osteoarthritis   . Shingles     Past Surgical History:  Procedure Laterality Date  . APPENDECTOMY    . cyst removed     abdomen  . CYSTOSCOPY W/ URETERAL STENT PLACEMENT Right 01/03/2017   Procedure: CYSTOSCOPY WITH RETROGRADE PYELOGRAM/ URETEROSCOPY/ STONE BASKETRY/URETERAL STENT PLACEMENT;  Surgeon: Cleon Gustin, MD;  Location:  [facility];  Service: Urology;  Laterality: Right;  . great toe surgery Bilateral 1987  . HOLMIUM LASER APPLICATION Right 2/29/7989   Procedure: HOLMIUM LASER APPLICATION;  Surgeon: Cleon Gustin, MD;  [facility];  Service: Urology;  Laterality: Right;    No current facility-administered medications for this encounter.    Current Outpatient Medications  Medication Sig Dispense Refill Last Dose  . acetaminophen (TYLENOL) 500 MG tablet Take 1,000 mg by mouth every 6 (six) hours as needed for mild pain.   Taking  . amLODipine (NORVASC) 10 MG tablet Take 10 mg by mouth daily.   Taking  . Ascorbic Acid (VITAMIN C) 1000 MG tablet Take 500 mg by mouth daily.    Taking  . chlorthalidone (HYGROTON) 25 MG tablet Take 0.5 tablets by mouth daily.   Taking  . Cholecalciferol (VITAMIN D3) 2000 UNITS capsule Take 2,000 Units by mouth daily.   Taking  . Cod Liver Oil CAPS Take 1 capsule by mouth daily.   Taking  . irbesartan (AVAPRO) 300 MG tablet Take 1 tablet by mouth daily.  1   . loratadine (ALLERGY RELIEF) 10 MG tablet Take 10 mg by mouth daily.   Taking  . Multiple Vitamin (MULTIVITAMIN) tablet Take 1 tablet by mouth daily.   Taking  . simvastatin (ZOCOR) 40 MG tablet Take 40 mg by mouth daily.    Taking  . valsartan (DIOVAN) 320 MG tablet Take 1 tablet by mouth daily.   Taking  Allergies  Allergen Reactions  . Lipitor [Atorvastatin]     Stomach pains, heart attack sx's    Social History   Tobacco Use  . Smoking status: Never Smoker  . Smokeless tobacco: Never Used  Substance Use Topics  . Alcohol use: No    Family History  Problem Relation Age of Onset  . Prostate cancer Father        ?  Marland Kitchen Alcohol abuse Mother   . Aneurysm Brother        heart  . Stroke Sister   . Breast cancer Sister 2  . Lung cancer Brother   . Colon cancer Neg Hx      Review of Systems  Constitutional: Negative for chills and fever.  HENT: Negative for  congestion, sore throat and tinnitus.   Eyes: Negative for double vision, photophobia and pain.  Respiratory: Negative for cough, shortness of breath and wheezing.   Cardiovascular: Negative for chest pain, palpitations and orthopnea.  Gastrointestinal: Negative for heartburn, nausea and vomiting.  Genitourinary: Negative for dysuria, frequency and urgency.  Musculoskeletal: Positive for joint pain ( with swelling in the right knee).  Neurological: Negative for dizziness, weakness and headaches.  Psychiatric/Behavioral: Negative for depression.    Objective:  Physical Exam  Overweight and well developed. General: Alert and oriented x3, cooperative and pleasant, no acute distress. Head: normocephalic, atraumatic, neck supple. Eyes: EOMI. Respiratory: breath sounds clear in all fields, no wheezing, rales, or rhonchi. Cardiovascular: Regular rate and rhythm, no murmurs, gallops or rubs.  Abdomen: non-tender to palpation and soft, normoactive bowel sounds. Musculoskeletal: Antalgic gait on the right without using assisted devices.  Right Knee Exam:  Moderate effusion. Valgus deformity.Range of motion is 5-130 degrees. No crepitus on range of motion of the knee. Tender, lateral greater than medial. Stable knee. Left Knee Exam:  No effusion. Range of motion is 0-130 degrees. Moderate crepitus on range of motion of the knee. Tender, medial greater than the lateral. Stable knee. Bilateral Hip Exam:  ROM: normal without discomfort. There is no tenderness over the greater trochanter. Calves soft and nontender. Motor function intact in LE. Strength 5/5 LE bilaterally. Neuro: Distal pulses 2+. Sensation to light touch intact in LE.  Vital signs in last 24 hours: Blood pressure: 140/90 mmHg Pulse: 72 bpm  Labs:   Estimated body mass index is 36.17 kg/m as calculated from the following:   Height as of 12/04/17: 5' 4.5" (1.638 m).   Weight as of 12/04/17: 97.1 kg (214 lb).   Imaging  Review Plain radiographs demonstrate severe degenerative joint disease of the right knee(s). The overall alignment isvalgus. The bone quality appears to be adequate for age and reported activity level.   Preoperative templating of the joint replacement has been completed, documented, and submitted to the Operating Room personnel in order to optimize intra-operative equipment management.   Anticipated LOS equal to or greater than 2 midnights due to - Age 71 and older with one or more of the following:  - Obesity  - Expected need for hospital services (PT, OT, Nursing) required for safe  discharge  - Anticipated need for postoperative skilled nursing care or inpatient rehab  - Active co-morbidities: Coronary Artery Disease and Cardiac Arrhythmia OR   - Unanticipated findings during/Post Surgery: None  - Patient is a high risk of re-admission due to: None     Assessment/Plan:  End stage arthritis, right knee   The patient history, physical examination, clinical judgment of the provider and imaging  studies are consistent with end stage degenerative joint disease of the right knee(s) and total knee arthroplasty is deemed medically necessary. The treatment options including medical management, injection therapy arthroscopy and arthroplasty were discussed at length. The risks and benefits of total knee arthroplasty were presented and reviewed. The risks due to aseptic loosening, infection, stiffness, patella tracking problems, thromboembolic complications and other imponderables were discussed. The patient acknowledged the explanation, agreed to proceed with the plan and consent was signed. Patient is being admitted for inpatient treatment for surgery, pain control, PT, OT, prophylactic antibiotics, VTE prophylaxis, progressive ambulation and ADL's and discharge planning. The patient is planning to be discharged home with virtual physical therapy.   Therapy Plans: virtual physical  therapy Disposition: Home with friend Planned DVT Prophylaxis: aspirin 325 mg BID DME needed: walker, 3-n-1 PCP: Dr. Crist Infante (medical clearance provided) TXA: IV Allergies: None Other: Prescription for virtual PT, walker, and 3-n-1 provided by Leonides Grills, RN  - Patient was instructed on what medications to stop prior to surgery. - Follow-up visit in 2 weeks with Dr. Wynelle Link - Begin physical therapy following surgery - Pre-operative lab work as pre-surgical testing - Prescriptions will be provided in hospital at time of discharge  Theresa Duty, PA-C Orthopedic Surgery [facility] [place]

## 2018-01-10 ENCOUNTER — Encounter (HOSPITAL_COMMUNITY): Payer: Self-pay

## 2018-01-10 NOTE — Pre-Procedure Instructions (Signed)
The following are in epic: Cardiac clearance and last office visit note by Phylliss Bob, NP 12/04/17 EKG 12/04/17  Surgical clearance Dr. Joylene Draft 12/03/2017 in chart

## 2018-01-10 NOTE — Patient Instructions (Signed)
Your procedure is scheduled on: Monday, January 20, 2018   Surgery Time:  1:35PM-2:35PM   Report to [facility]  Entrance    Report to admitting at 10:15 AM   Call this number if you have problems the morning of surgery [phone]   Bring CPAP mask and tubing day of surgery   Do not eat food:After Midnight.   Do NOT smoke after Midnight   May have liquids until 7:30AM the day of surgery  CLEAR LIQUID DIET   Foods Allowed                                                                     Foods Excluded  Water, Coffee and tea, regular and decaf                             liquids that you cannot  Plain Jell-O in any flavor                                             see through such as: Fruit ices (not with fruit pulp)                                     milk, soups, orange juice  Iced Popsicles                                    All solid food Carbonated beverages, regular and diet                                    Cranberry, grape and apple juices Sports drinks like Gatorade Lightly seasoned clear broth or consume(fat free) Sugar, honey syrup  Sample Menu Breakfast                                Lunch                                     Supper Cranberry juice                    Beef broth                            Chicken broth Jell-O                                     Grape juice                           Apple juice Coffee or tea  Jell-O                                      Popsicle                                                Coffee or tea                        Coffee or tea   Take these medicines the morning of surgery with A SIP OF WATER: Amlodipine, Loratadine, Simvastatin                               You may not have any metal on your body including hair pins, jewelry, and body piercings             Do not wear make-up, lotions, powders, perfumes/cologne, or deodorant             Do not wear nail polish.  Do not shave   48 hours prior to surgery.                Do not bring valuables to the hospital. [place].   Contacts, dentures or bridgework may not be worn into surgery.   Leave suitcase in the car. After surgery it may be brought to your room.   Special Instructions: Bring a copy of your healthcare power of attorney and living will documents         the day of surgery if you haven't scanned them in before.              Please read over the following fact sheets you were given:  [facility] - Preparing for Surgery Before surgery, you can play an important role.  Because skin is not sterile, your skin needs to be as free of germs as possible.  You can reduce the number of germs on your skin by washing with CHG (chlorahexidine gluconate) soap before surgery.  CHG is an antiseptic cleaner which kills germs and bonds with the skin to continue killing germs even after washing. Please DO NOT use if you have an allergy to CHG or antibacterial soaps.  If your skin becomes reddened/irritated stop using the CHG and inform your nurse when you arrive at Short Stay. Do not shave (including legs and underarms) for at least 48 hours prior to the first CHG shower.  You may shave your face/neck.  Please follow these instructions carefully:  1.  Shower with CHG Soap the night before surgery and the  morning of surgery.  2.  If you choose to wash your hair, wash your hair first as usual with your normal  shampoo.  3.  After you shampoo, rinse your hair and body thoroughly to remove the shampoo.                             4.  Use CHG as you would any other liquid soap.  You can apply chg directly to the skin and wash.  Gently  with a scrungie or clean washcloth.  5.  Apply the CHG Soap to your body ONLY FROM THE NECK DOWN.   Do   not use on face/ open                           Wound or open sores. Avoid contact with eyes, ears mouth and   genitals (private parts).                        Wash face,  Genitals (private parts) with your normal soap.             6.  Wash thoroughly, paying special attention to the area where your    surgery  will be performed.  7.  Thoroughly rinse your body with warm water from the neck down.  8.  DO NOT shower/wash with your normal soap after using and rinsing off the CHG Soap.                9.  Pat yourself dry with a clean towel.            10.  Wear clean pajamas.            11.  Place clean sheets on your bed the night of your first shower and do not  sleep with pets. Day of Surgery : Do not apply any lotions/deodorants the morning of surgery.  Please wear clean clothes to the hospital/surgery center.  FAILURE TO FOLLOW THESE INSTRUCTIONS MAY RESULT IN THE CANCELLATION OF YOUR SURGERY  PATIENT SIGNATURE_________________________________  NURSE SIGNATURE__________________________________  ________________________________________________________________________   Kathryn Kent  An incentive spirometer is a tool that can help keep your lungs clear and active. This tool measures how well you are filling your lungs with each breath. Taking long deep breaths may help reverse or decrease the chance of developing breathing (pulmonary) problems (especially infection) following:  A long period of time when you are unable to move or be active. BEFORE THE PROCEDURE   If the spirometer includes an indicator to show your best effort, your nurse or respiratory therapist will set it to a desired goal.  If possible, sit up straight or lean slightly forward. Try not to slouch.  Hold the incentive spirometer in an upright position. INSTRUCTIONS FOR USE  1. Sit on the edge of your bed if possible, or sit up as far as you can in bed or on a chair. 2. Hold the incentive spirometer in an upright position. 3. Breathe out normally. 4. Place the mouthpiece in your mouth and seal your lips tightly around it. 5. Breathe in slowly and as  deeply as possible, raising the piston or the ball toward the top of the column. 6. Hold your breath for 3-5 seconds or for as long as possible. Allow the piston or ball to fall to the bottom of the column. 7. Remove the mouthpiece from your mouth and breathe out normally. 8. Rest for a few seconds and repeat Steps 1 through 7 at least 10 times every 1-2 hours when you are awake. Take your time and take a few normal breaths between deep breaths. 9. The spirometer may include an indicator to show your best effort. Use the indicator as a goal to work toward during each repetition. 10. After each set of 10 deep breaths, practice coughing to be sure your lungs are clear. If you have an incision (the  cut made at the time of surgery), support your incision when coughing by placing a pillow or rolled up towels firmly against it. Once you are able to get out of bed, walk around indoors and cough well. You may stop using the incentive spirometer when instructed by your caregiver.  RISKS AND COMPLICATIONS  Take your time so you do not get dizzy or light-headed.  If you are in pain, you may need to take or ask for pain medication before doing incentive spirometry. It is harder to take a deep breath if you are having pain. AFTER USE  Rest and breathe slowly and easily.  It can be helpful to keep track of a log of your progress. Your caregiver can provide you with a simple table to help with this. If you are using the spirometer at home, follow these instructions: [place] IF:   You are having difficultly using the spirometer.  You have trouble using the spirometer as often as instructed.  Your pain medication is not giving enough relief while using the spirometer.  You develop fever of 100.5 F (38.1 C) or higher. SEEK IMMEDIATE MEDICAL CARE IF:   You cough up bloody sputum that had not been present before.  You develop fever of 102 F (38.9 C) or greater.  You develop worsening pain  at or near the incision site. MAKE SURE YOU:   Understand these instructions.  Will watch your condition.  Will get help right away if you are not doing well or get worse. Document Released: 10/22/2006 Document Revised: 09/03/2011 Document Reviewed: 12/23/2006 Exit[employer] Patient Information 2014 Exit[employer], [place].   ________________________________________________________________________  WHAT IS A BLOOD TRANSFUSION? Blood Transfusion Information  A transfusion is the replacement of blood or some of its parts. Blood is made up of multiple cells which provide different functions.  Red blood cells carry oxygen and are used for blood loss replacement.  White blood cells fight against infection.  Platelets control bleeding.  Plasma helps clot blood.  Other blood products are available for specialized needs, such as hemophilia or other clotting disorders. BEFORE THE TRANSFUSION  Who gives blood for transfusions?   Healthy volunteers who are fully evaluated to make sure their blood is safe. This is blood bank blood. Transfusion therapy is the safest it has ever been in the practice of medicine. Before blood is taken from a donor, a complete history is taken to make sure that person has no history of diseases nor engages in risky social behavior (examples are intravenous drug use or sexual activity with multiple partners). The donor's travel history is screened to minimize risk of transmitting infections, such as malaria. The donated blood is tested for signs of infectious diseases, such as HIV and hepatitis. The blood is then tested to be sure it is compatible with you in order to minimize the chance of a transfusion reaction. If you or a relative donates blood, this is often done in anticipation of surgery and is not appropriate for emergency situations. It takes many days to process the donated blood. RISKS AND COMPLICATIONS Although transfusion therapy is very safe and saves many lives, the  main dangers of transfusion include:   Getting an infectious disease.  Developing a transfusion reaction. This is an allergic reaction to something in the blood you were given. Every precaution is taken to prevent this. The decision to have a blood transfusion has been considered carefully by your caregiver before blood is given. Blood is not given unless the  benefits outweigh the risks. AFTER THE TRANSFUSION  Right after receiving a blood transfusion, you will usually feel much better and more energetic. This is especially true if your red blood cells have gotten low (anemic). The transfusion raises the level of the red blood cells which carry oxygen, and this usually causes an energy increase.  The nurse administering the transfusion will monitor you carefully for complications. HOME CARE INSTRUCTIONS  No special instructions are needed after a transfusion. You may find your energy is better. Speak with your caregiver about any limitations on activity for underlying diseases you may have. SEEK MEDICAL CARE IF:   Your condition is not improving after your transfusion.  You develop redness or irritation at the intravenous (IV) site. SEEK IMMEDIATE MEDICAL CARE IF:  Any of the following symptoms occur over the next 12 hours:  Shaking chills.  You have a temperature by mouth above 102 F (38.9 C), not controlled by medicine.  Chest, back, or muscle pain.  People around you feel you are not acting correctly or are confused.  Shortness of breath or difficulty breathing.  Dizziness and fainting.  You get a rash or develop hives.  You have a decrease in urine output.  Your urine turns a dark color or changes to pink, red, or brown. Any of the following symptoms occur over the next 10 days:  You have a temperature by mouth above 102 F (38.9 C), not controlled by medicine.  Shortness of breath.  Weakness after normal activity.  The white part of the eye turns yellow  (jaundice).  You have a decrease in the amount of urine or are urinating less often.  Your urine turns a dark color or changes to pink, red, or brown. Document Released: 06/08/2000 Document Revised: 09/03/2011 Document Reviewed: 01/26/2008 [facility] Patient Information 2014 [place], [place].  _______________________________________________________________________

## 2018-01-13 ENCOUNTER — Other Ambulatory Visit: Payer: Self-pay

## 2018-01-13 ENCOUNTER — Encounter (HOSPITAL_COMMUNITY)
Admission: RE | Admit: 2018-01-13 | Discharge: 2018-01-13 | Disposition: A | Discharge status: Home / Self Care | Payer: Medicare Other | Source: Ambulatory Visit | Attending: Orthopedic Surgery | Admitting: Orthopedic Surgery

## 2018-01-13 ENCOUNTER — Encounter (HOSPITAL_COMMUNITY): Payer: Self-pay

## 2018-01-13 DIAGNOSIS — M1711 Unilateral primary osteoarthritis, right knee: Secondary | ICD-10-CM | POA: Insufficient documentation

## 2018-01-13 DIAGNOSIS — Z01812 Encounter for preprocedural laboratory examination: Secondary | ICD-10-CM | POA: Diagnosis not present

## 2018-01-13 DIAGNOSIS — Z0183 Encounter for blood typing: Secondary | ICD-10-CM | POA: Insufficient documentation

## 2018-01-13 HISTORY — DX: Umbilical hernia without obstruction or gangrene: K42.9

## 2018-01-13 HISTORY — DX: Other specified diseases of liver: K76.89

## 2018-01-13 HISTORY — DX: Obesity, unspecified: E66.9

## 2018-01-13 HISTORY — DX: Vulvar cyst: N90.7

## 2018-01-13 HISTORY — DX: Cyst of kidney, acquired: N28.1

## 2018-01-13 HISTORY — DX: Personal history of urinary calculi: Z87.442

## 2018-01-13 LAB — URINALYSIS, ROUTINE W REFLEX MICROSCOPIC
Bilirubin Urine: NEGATIVE
Glucose, UA: NEGATIVE mg/dL
Hgb urine dipstick: NEGATIVE
Ketones, ur: NEGATIVE mg/dL
Nitrite: NEGATIVE
Protein, ur: NEGATIVE mg/dL
Specific Gravity, Urine: 1.021 (ref 1.005–1.030)
WBC, UA: >50 WBC/hpf — ABNORMAL HIGH (ref 0–5)
pH: 6 (ref 5.0–8.0)

## 2018-01-13 LAB — PROTIME-INR
INR: 1.14
PROTHROMBIN TIME: 14.5 s (ref 11.4–15.2)

## 2018-01-13 LAB — COMPREHENSIVE METABOLIC PANEL
ALT: 26 U/L (ref 0–44)
AST: 26 U/L (ref 15–41)
Albumin: 4.4 g/dL (ref 3.5–5.0)
Alkaline Phosphatase: 54 U/L (ref 38–126)
Anion gap: 10 (ref 5–15)
BUN: 17 mg/dL (ref 8–23)
CO2: 30 mmol/L (ref 22–32)
Calcium: 10.1 mg/dL (ref 8.9–10.3)
Chloride: 104 mmol/L (ref 98–111)
Creatinine, Ser: 0.93 mg/dL (ref 0.44–1.00)
GFR calc Af Amer: >60 mL/min (ref >60)
GFR calc non Af Amer: 57 mL/min — ABNORMAL LOW (ref >60)
Glucose, Bld: 101 mg/dL — ABNORMAL HIGH (ref 70–99)
Potassium: 3.4 mmol/L — ABNORMAL LOW (ref 3.5–5.1)
Sodium: 144 mmol/L (ref 135–145)
Total Bilirubin: 1.2 mg/dL (ref 0.3–1.2)
Total Protein: 8.2 g/dL — ABNORMAL HIGH (ref 6.5–8.1)

## 2018-01-13 LAB — CBC
HCT: 42.5 % (ref 36.0–46.0)
Hemoglobin: 13.9 g/dL (ref 12.0–15.0)
MCH: 29.6 pg (ref 26.0–34.0)
MCHC: 32.7 g/dL (ref 30.0–36.0)
MCV: 90.6 fL (ref 78.0–100.0)
Platelets: 220 10*3/uL (ref 150–400)
RBC: 4.69 MIL/uL (ref 3.87–5.11)
RDW: 13.9 % (ref 11.5–15.5)
WBC: 6.4 10*3/uL (ref 4.0–10.5)

## 2018-01-13 LAB — ABO/RH: ABO/RH(D): B POS

## 2018-01-13 LAB — SURGICAL PCR SCREEN
MRSA, PCR: NEGATIVE
Staphylococcus aureus: NEGATIVE

## 2018-01-13 LAB — APTT: aPTT: 27 seconds (ref 24–36)

## 2018-01-14 NOTE — Pre-Procedure Instructions (Signed)
CMP and UA results 01/13/18 faxed to Dr. Wynelle Link via epic.

## 2018-01-16 ENCOUNTER — Other Ambulatory Visit: Payer: Self-pay | Admitting: Orthopedic Surgery

## 2018-01-16 NOTE — Care Plan (Signed)
Ortho Bundle R TKA scheduled on 01-20-18 DCP:  Home with friends.  2 story home with 3 ste.   DME:  No needs. Rx given at H&P appt for a RW and 3-in-1 PT:  Virtual PT.

## 2018-01-19 MED ORDER — BUPIVACAINE LIPOSOME 1.3 % IJ SUSP
20.0000 mL | INTRAMUSCULAR | Status: DC
  Filled 2018-01-19: qty 20

## 2018-01-20 ENCOUNTER — Inpatient Hospital Stay (HOSPITAL_COMMUNITY): Payer: Medicare Other | Admitting: Anesthesiology

## 2018-01-20 ENCOUNTER — Encounter (HOSPITAL_COMMUNITY): Payer: Self-pay

## 2018-01-20 ENCOUNTER — Inpatient Hospital Stay (HOSPITAL_COMMUNITY)
Admission: RE | Admit: 2018-01-20 | Discharge: 2018-01-22 | DRG: 470 | Disposition: A | Discharge status: Home / Self Care | Payer: Medicare Other | Source: Ambulatory Visit | Attending: Orthopedic Surgery | Admitting: Orthopedic Surgery

## 2018-01-20 ENCOUNTER — Other Ambulatory Visit: Payer: Self-pay

## 2018-01-20 ENCOUNTER — Encounter (HOSPITAL_COMMUNITY)
Admission: RE | Disposition: A | Discharge status: Home / Self Care | Payer: Self-pay | Source: Ambulatory Visit | Attending: Orthopedic Surgery

## 2018-01-20 DIAGNOSIS — Z803 Family history of malignant neoplasm of breast: Secondary | ICD-10-CM

## 2018-01-20 DIAGNOSIS — I2510 Atherosclerotic heart disease of native coronary artery without angina pectoris: Secondary | ICD-10-CM | POA: Diagnosis present

## 2018-01-20 DIAGNOSIS — M1711 Unilateral primary osteoarthritis, right knee: Principal | ICD-10-CM | POA: Diagnosis present

## 2018-01-20 DIAGNOSIS — Z6835 Body mass index (BMI) 35.0-35.9, adult: Secondary | ICD-10-CM | POA: Diagnosis not present

## 2018-01-20 DIAGNOSIS — Z801 Family history of malignant neoplasm of trachea, bronchus and lung: Secondary | ICD-10-CM | POA: Diagnosis not present

## 2018-01-20 DIAGNOSIS — I10 Essential (primary) hypertension: Secondary | ICD-10-CM | POA: Diagnosis not present

## 2018-01-20 DIAGNOSIS — Z9049 Acquired absence of other specified parts of digestive tract: Secondary | ICD-10-CM

## 2018-01-20 DIAGNOSIS — G4733 Obstructive sleep apnea (adult) (pediatric): Secondary | ICD-10-CM | POA: Diagnosis not present

## 2018-01-20 DIAGNOSIS — Z8042 Family history of malignant neoplasm of prostate: Secondary | ICD-10-CM

## 2018-01-20 DIAGNOSIS — M1710 Unilateral primary osteoarthritis, unspecified knee: Secondary | ICD-10-CM | POA: Diagnosis present

## 2018-01-20 DIAGNOSIS — Z87442 Personal history of urinary calculi: Secondary | ICD-10-CM | POA: Diagnosis not present

## 2018-01-20 DIAGNOSIS — K5790 Diverticulosis of intestine, part unspecified, without perforation or abscess without bleeding: Secondary | ICD-10-CM | POA: Diagnosis present

## 2018-01-20 DIAGNOSIS — E785 Hyperlipidemia, unspecified: Secondary | ICD-10-CM | POA: Diagnosis present

## 2018-01-20 DIAGNOSIS — Z888 Allergy status to other drugs, medicaments and biological substances status: Secondary | ICD-10-CM

## 2018-01-20 DIAGNOSIS — G8918 Other acute postprocedural pain: Secondary | ICD-10-CM | POA: Diagnosis not present

## 2018-01-20 DIAGNOSIS — E669 Obesity, unspecified: Secondary | ICD-10-CM | POA: Diagnosis not present

## 2018-01-20 DIAGNOSIS — I4891 Unspecified atrial fibrillation: Secondary | ICD-10-CM | POA: Diagnosis not present

## 2018-01-20 DIAGNOSIS — M179 Osteoarthritis of knee, unspecified: Secondary | ICD-10-CM

## 2018-01-20 DIAGNOSIS — Z811 Family history of alcohol abuse and dependence: Secondary | ICD-10-CM | POA: Diagnosis not present

## 2018-01-20 DIAGNOSIS — Z823 Family history of stroke: Secondary | ICD-10-CM

## 2018-01-20 DIAGNOSIS — Z86010 Personal history of colonic polyps: Secondary | ICD-10-CM

## 2018-01-20 HISTORY — PX: TOTAL KNEE ARTHROPLASTY: SHX125

## 2018-01-20 LAB — TYPE AND SCREEN
ABO/RH(D): B POS
Antibody Screen: NEGATIVE

## 2018-01-20 SURGERY — ARTHROPLASTY, KNEE, TOTAL
Anesthesia: Spinal | Site: Knee | Laterality: Right

## 2018-01-20 MED ORDER — ASPIRIN EC 325 MG PO TBEC
325.0000 mg | DELAYED_RELEASE_TABLET | Freq: Two times a day (BID) | ORAL | Status: DC
  Administered 2018-01-21 – 2018-01-22 (×3): 325 mg via ORAL
  Filled 2018-01-20 (×3): qty 1

## 2018-01-20 MED ORDER — DOCUSATE SODIUM 100 MG PO CAPS
100.0000 mg | ORAL_CAPSULE | Freq: Two times a day (BID) | ORAL | Status: DC
  Administered 2018-01-20 – 2018-01-22 (×4): 100 mg via ORAL
  Filled 2018-01-20 (×4): qty 1

## 2018-01-20 MED ORDER — OXYCODONE HCL 5 MG PO TABS
5.0000 mg | ORAL_TABLET | ORAL | Status: DC | PRN
  Administered 2018-01-20 – 2018-01-21 (×2): 5 mg via ORAL
  Administered 2018-01-21: 10 mg via ORAL
  Filled 2018-01-20: qty 1
  Filled 2018-01-20: qty 2
  Filled 2018-01-20: qty 1

## 2018-01-20 MED ORDER — AMLODIPINE BESYLATE 10 MG PO TABS
10.0000 mg | ORAL_TABLET | Freq: Every day | ORAL | Status: DC
  Administered 2018-01-21 – 2018-01-22 (×2): 10 mg via ORAL
  Filled 2018-01-20 (×2): qty 1

## 2018-01-20 MED ORDER — PROPOFOL 500 MG/50ML IV EMUL
INTRAVENOUS | Status: DC | PRN
  Administered 2018-01-20: 40 ug/kg/min via INTRAVENOUS

## 2018-01-20 MED ORDER — ONDANSETRON HCL 4 MG PO TABS: 4.0000 mg | ORAL_TABLET | Freq: Four times a day (QID) | ORAL | Status: DC | PRN

## 2018-01-20 MED ORDER — EPHEDRINE SULFATE 50 MG/ML IJ SOLN
INTRAMUSCULAR | Status: DC | PRN
  Administered 2018-01-20: 10 mg via INTRAVENOUS

## 2018-01-20 MED ORDER — ACETAMINOPHEN 500 MG PO TABS
1000.0000 mg | ORAL_TABLET | Freq: Four times a day (QID) | ORAL | Status: AC
  Administered 2018-01-20 – 2018-01-21 (×4): 1000 mg via ORAL
  Filled 2018-01-20 (×4): qty 2

## 2018-01-20 MED ORDER — PHENYLEPHRINE 40 MCG/ML (10ML) SYRINGE FOR IV PUSH (FOR BLOOD PRESSURE SUPPORT)
PREFILLED_SYRINGE | INTRAVENOUS | Status: DC | PRN
  Administered 2018-01-20: 80 ug via INTRAVENOUS
  Administered 2018-01-20: 160 ug via INTRAVENOUS
  Administered 2018-01-20 (×3): 80 ug via INTRAVENOUS

## 2018-01-20 MED ORDER — PHENOL 1.4 % MT LIQD
1.0000 | OROMUCOSAL | Status: DC | PRN
  Filled 2018-01-20: qty 177

## 2018-01-20 MED ORDER — TRAMADOL HCL 50 MG PO TABS
50.0000 mg | ORAL_TABLET | Freq: Four times a day (QID) | ORAL | Status: DC | PRN
  Filled 2018-01-20: qty 2

## 2018-01-20 MED ORDER — ONDANSETRON HCL 4 MG/2ML IJ SOLN
INTRAMUSCULAR | Status: DC | PRN
  Administered 2018-01-20: 4 mg via INTRAVENOUS

## 2018-01-20 MED ORDER — SODIUM CHLORIDE 0.9 % IJ SOLN
INTRAMUSCULAR | Status: DC | PRN
  Administered 2018-01-20: 60 mL

## 2018-01-20 MED ORDER — METHOCARBAMOL 500 MG PO TABS
500.0000 mg | ORAL_TABLET | Freq: Four times a day (QID) | ORAL | Status: DC | PRN
  Administered 2018-01-20 – 2018-01-22 (×5): 500 mg via ORAL
  Filled 2018-01-20 (×5): qty 1

## 2018-01-20 MED ORDER — STERILE WATER FOR IRRIGATION IR SOLN
Status: DC | PRN
  Administered 2018-01-20: 2000 mL

## 2018-01-20 MED ORDER — LACTATED RINGERS IV SOLN
INTRAVENOUS | Status: DC
  Administered 2018-01-20: 15:00:00 via INTRAVENOUS
  Administered 2018-01-20: 1000 mL via INTRAVENOUS

## 2018-01-20 MED ORDER — BUPIVACAINE LIPOSOME 1.3 % IJ SUSP
INTRAMUSCULAR | Status: DC | PRN
  Administered 2018-01-20: 20 mL

## 2018-01-20 MED ORDER — SODIUM CHLORIDE 0.9 % IJ SOLN
INTRAMUSCULAR | Status: AC
  Filled 2018-01-20: qty 50

## 2018-01-20 MED ORDER — DEXAMETHASONE SODIUM PHOSPHATE 10 MG/ML IJ SOLN
INTRAMUSCULAR | Status: AC
  Filled 2018-01-20: qty 1

## 2018-01-20 MED ORDER — ROPIVACAINE HCL 5 MG/ML IJ SOLN
INTRAMUSCULAR | Status: DC | PRN
  Administered 2018-01-20: 20 mL via PERINEURAL

## 2018-01-20 MED ORDER — FENTANYL CITRATE (PF) 100 MCG/2ML IJ SOLN
50.0000 ug | INTRAMUSCULAR | Status: DC
  Administered 2018-01-20: 50 ug via INTRAVENOUS
  Filled 2018-01-20: qty 2

## 2018-01-20 MED ORDER — SIMVASTATIN 20 MG PO TABS
40.0000 mg | ORAL_TABLET | Freq: Every day | ORAL | Status: DC
  Administered 2018-01-20 – 2018-01-21 (×2): 40 mg via ORAL
  Filled 2018-01-20 (×2): qty 2

## 2018-01-20 MED ORDER — HYDROMORPHONE HCL 1 MG/ML IJ SOLN
0.5000 mg | INTRAMUSCULAR | Status: DC | PRN
Start: 2018-01-20 — End: 2018-01-22

## 2018-01-20 MED ORDER — HYDROMORPHONE HCL 1 MG/ML IJ SOLN: 0.2500 mg | INTRAMUSCULAR | Status: DC | PRN

## 2018-01-20 MED ORDER — ONDANSETRON HCL 4 MG/2ML IJ SOLN: 4.0000 mg | Freq: Four times a day (QID) | INTRAMUSCULAR | Status: DC | PRN

## 2018-01-20 MED ORDER — PROMETHAZINE HCL 25 MG/ML IJ SOLN: 6.2500 mg | INTRAMUSCULAR | Status: DC | PRN

## 2018-01-20 MED ORDER — SODIUM CHLORIDE 0.9 % IJ SOLN
INTRAMUSCULAR | Status: AC
  Filled 2018-01-20: qty 10

## 2018-01-20 MED ORDER — FLEET ENEMA 7-19 GM/118ML RE ENEM: 1.0000 | ENEMA | Freq: Once | RECTAL | Status: DC | PRN

## 2018-01-20 MED ORDER — MIDAZOLAM HCL 2 MG/2ML IJ SOLN
1.0000 mg | INTRAMUSCULAR | Status: DC
  Administered 2018-01-20: 1 mg via INTRAVENOUS
  Filled 2018-01-20: qty 2

## 2018-01-20 MED ORDER — DIPHENHYDRAMINE HCL 12.5 MG/5ML PO ELIX: 12.5000 mg | ORAL_SOLUTION | ORAL | Status: DC | PRN

## 2018-01-20 MED ORDER — METHOCARBAMOL 1000 MG/10ML IJ SOLN
500.0000 mg | Freq: Four times a day (QID) | INTRAVENOUS | Status: DC | PRN
  Filled 2018-01-20: qty 5

## 2018-01-20 MED ORDER — OXYCODONE HCL 5 MG/5ML PO SOLN
5.0000 mg | Freq: Once | ORAL | Status: DC | PRN
  Filled 2018-01-20: qty 5

## 2018-01-20 MED ORDER — CEFAZOLIN SODIUM-DEXTROSE 2-4 GM/100ML-% IV SOLN
2.0000 g | Freq: Four times a day (QID) | INTRAVENOUS | Status: AC
  Administered 2018-01-20 – 2018-01-21 (×2): 2 g via INTRAVENOUS
  Filled 2018-01-20 (×2): qty 100

## 2018-01-20 MED ORDER — BUPIVACAINE IN DEXTROSE 0.75-8.25 % IT SOLN
INTRATHECAL | Status: DC | PRN
  Administered 2018-01-20: 1.8 mL via INTRATHECAL

## 2018-01-20 MED ORDER — CEFAZOLIN SODIUM-DEXTROSE 2-4 GM/100ML-% IV SOLN
2.0000 g | INTRAVENOUS | Status: AC
  Administered 2018-01-20: 2 g via INTRAVENOUS
  Filled 2018-01-20: qty 100

## 2018-01-20 MED ORDER — BISACODYL 10 MG RE SUPP: 10.0000 mg | Freq: Every day | RECTAL | Status: DC | PRN

## 2018-01-20 MED ORDER — CHLORHEXIDINE GLUCONATE 4 % EX LIQD: 60.0000 mL | Freq: Once | CUTANEOUS | Status: DC

## 2018-01-20 MED ORDER — SODIUM CHLORIDE 0.9 % IV SOLN
INTRAVENOUS | Status: DC
  Administered 2018-01-20: 18:00:00 via INTRAVENOUS

## 2018-01-20 MED ORDER — DEXAMETHASONE SODIUM PHOSPHATE 10 MG/ML IJ SOLN
8.0000 mg | Freq: Once | INTRAMUSCULAR | Status: AC
  Administered 2018-01-20: 8 mg via INTRAVENOUS

## 2018-01-20 MED ORDER — IRBESARTAN 150 MG PO TABS
300.0000 mg | ORAL_TABLET | Freq: Every day | ORAL | Status: DC
  Administered 2018-01-21 – 2018-01-22 (×2): 300 mg via ORAL
  Filled 2018-01-20 (×2): qty 2

## 2018-01-20 MED ORDER — CHLORTHALIDONE 25 MG PO TABS
12.5000 mg | ORAL_TABLET | Freq: Every day | ORAL | Status: DC
  Administered 2018-01-21 – 2018-01-22 (×2): 12.5 mg via ORAL
  Filled 2018-01-20 (×2): qty 1

## 2018-01-20 MED ORDER — SODIUM CHLORIDE 0.9 % IR SOLN
Status: DC | PRN
  Administered 2018-01-20: 1000 mL

## 2018-01-20 MED ORDER — ONDANSETRON HCL 4 MG/2ML IJ SOLN
INTRAMUSCULAR | Status: AC
  Filled 2018-01-20: qty 2

## 2018-01-20 MED ORDER — 0.9 % SODIUM CHLORIDE (POUR BTL) OPTIME
TOPICAL | Status: DC | PRN
  Administered 2018-01-20: 1000 mL

## 2018-01-20 MED ORDER — PROPOFOL 10 MG/ML IV BOLUS
INTRAVENOUS | Status: AC
  Filled 2018-01-20: qty 40

## 2018-01-20 MED ORDER — METOCLOPRAMIDE HCL 5 MG PO TABS: 5.0000 mg | ORAL_TABLET | Freq: Three times a day (TID) | ORAL | Status: DC | PRN

## 2018-01-20 MED ORDER — POLYETHYLENE GLYCOL 3350 17 G PO PACK: 17.0000 g | PACK | Freq: Every day | ORAL | Status: DC | PRN

## 2018-01-20 MED ORDER — METOCLOPRAMIDE HCL 5 MG/ML IJ SOLN: 5.0000 mg | Freq: Three times a day (TID) | INTRAMUSCULAR | Status: DC | PRN

## 2018-01-20 MED ORDER — MENTHOL 3 MG MT LOZG: 1.0000 | LOZENGE | OROMUCOSAL | Status: DC | PRN

## 2018-01-20 MED ORDER — DEXAMETHASONE SODIUM PHOSPHATE 10 MG/ML IJ SOLN
10.0000 mg | Freq: Once | INTRAMUSCULAR | Status: AC
  Administered 2018-01-21: 10 mg via INTRAVENOUS
  Filled 2018-01-20: qty 1

## 2018-01-20 MED ORDER — TRANEXAMIC ACID 1000 MG/10ML IV SOLN
1000.0000 mg | INTRAVENOUS | Status: AC
  Administered 2018-01-20: 1000 mg via INTRAVENOUS
  Filled 2018-01-20: qty 10

## 2018-01-20 MED ORDER — SODIUM CHLORIDE 0.9 % IV SOLN
1000.0000 mg | Freq: Once | INTRAVENOUS | Status: AC
  Administered 2018-01-20: 1000 mg via INTRAVENOUS
  Filled 2018-01-20: qty 1100

## 2018-01-20 MED ORDER — OXYCODONE HCL 5 MG PO TABS: 10.0000 mg | ORAL_TABLET | ORAL | Status: DC | PRN

## 2018-01-20 MED ORDER — OXYCODONE HCL 5 MG PO TABS: 5.0000 mg | ORAL_TABLET | Freq: Once | ORAL | Status: DC | PRN

## 2018-01-20 MED ORDER — ACETAMINOPHEN 10 MG/ML IV SOLN
1000.0000 mg | Freq: Four times a day (QID) | INTRAVENOUS | Status: DC
  Administered 2018-01-20: 1000 mg via INTRAVENOUS
  Filled 2018-01-20: qty 100

## 2018-01-20 MED ORDER — DIPHENHYDRAMINE HCL 25 MG PO CAPS
25.0000 mg | ORAL_CAPSULE | Freq: Every day | ORAL | Status: DC
  Administered 2018-01-21 – 2018-01-22 (×2): 25 mg via ORAL
  Filled 2018-01-20 (×2): qty 1

## 2018-01-20 SURGICAL SUPPLY — 49 items
BAG ZIPLOCK 12X15 (MISCELLANEOUS) ×2 IMPLANT
BANDAGE ACE 6X5 VEL STRL LF (GAUZE/BANDAGES/DRESSINGS) ×2 IMPLANT
BLADE SAG 18X100X1.27 (BLADE) ×2 IMPLANT
BLADE SAW SGTL 11.0X1.19X90.0M (BLADE) ×2 IMPLANT
BOWL SMART MIX CTS (DISPOSABLE) ×2 IMPLANT
CAP KNEE TOTAL 3 SIGMA ×2 IMPLANT
CEMENT HV SMART SET (Cement) ×4 IMPLANT
COVER SURGICAL LIGHT HANDLE (MISCELLANEOUS) ×2 IMPLANT
CUFF TOURN SGL QUICK 34 (TOURNIQUET CUFF) ×1
CUFF TRNQT CYL 34X4X40X1 (TOURNIQUET CUFF) ×1 IMPLANT
DECANTER SPIKE VIAL GLASS SM (MISCELLANEOUS) ×2 IMPLANT
DRAPE U-SHAPE 47X51 STRL (DRAPES) ×2 IMPLANT
DRSG ADAPTIC 3X8 NADH LF (GAUZE/BANDAGES/DRESSINGS) ×2 IMPLANT
DRSG PAD ABDOMINAL 8X10 ST (GAUZE/BANDAGES/DRESSINGS) ×2 IMPLANT
DURAPREP 26ML APPLICATOR (WOUND CARE) ×2 IMPLANT
ELECT REM PT RETURN 15FT ADLT (MISCELLANEOUS) ×2 IMPLANT
EVACUATOR 1/8 PVC DRAIN (DRAIN) ×2 IMPLANT
GAUZE SPONGE 4X4 12PLY STRL (GAUZE/BANDAGES/DRESSINGS) ×2 IMPLANT
GLOVE BIO SURGEON STRL SZ7 (GLOVE) ×2 IMPLANT
GLOVE BIO SURGEON STRL SZ8 (GLOVE) ×2 IMPLANT
GLOVE BIOGEL PI IND STRL 6.5 (GLOVE) IMPLANT
GLOVE BIOGEL PI IND STRL 7.0 (GLOVE) ×1 IMPLANT
GLOVE BIOGEL PI IND STRL 8 (GLOVE) ×1 IMPLANT
GLOVE BIOGEL PI INDICATOR 6.5 (GLOVE)
GLOVE BIOGEL PI INDICATOR 7.0 (GLOVE) ×1
GLOVE BIOGEL PI INDICATOR 8 (GLOVE) ×1
GLOVE SURG SS PI 6.5 STRL IVOR (GLOVE) IMPLANT
GOWN STRL REUS W/TWL LRG LVL3 (GOWN DISPOSABLE) ×4 IMPLANT
HANDPIECE INTERPULSE COAX TIP (DISPOSABLE) ×1
HOLDER FOLEY CATH W/STRAP (MISCELLANEOUS) ×2 IMPLANT
IMMOBILIZER KNEE 20 (SOFTGOODS) ×2
IMMOBILIZER KNEE 20 THIGH 36 (SOFTGOODS) ×1 IMPLANT
MANIFOLD NEPTUNE II (INSTRUMENTS) ×2 IMPLANT
NS IRRIG 1000ML POUR BTL (IV SOLUTION) ×2 IMPLANT
PACK TOTAL KNEE CUSTOM (KITS) ×2 IMPLANT
PADDING CAST COTTON 6X4 STRL (CAST SUPPLIES) ×4 IMPLANT
POSITIONER SURGICAL ARM (MISCELLANEOUS) ×2 IMPLANT
SET HNDPC FAN SPRY TIP SCT (DISPOSABLE) ×1 IMPLANT
STRIP CLOSURE SKIN 1/2X4 (GAUZE/BANDAGES/DRESSINGS) ×4 IMPLANT
SUT MNCRL AB 4-0 PS2 18 (SUTURE) ×2 IMPLANT
SUT STRATAFIX 0 PDS 27 VIOLET (SUTURE) ×2
SUT VIC AB 2-0 CT1 27 (SUTURE) ×3
SUT VIC AB 2-0 CT1 TAPERPNT 27 (SUTURE) ×3 IMPLANT
SUTURE STRATFX 0 PDS 27 VIOLET (SUTURE) ×1 IMPLANT
TRAY FOLEY CATH 14FRSI W/METER (CATHETERS) ×2 IMPLANT
TRAY FOLEY MTR SLVR 16FR STAT (SET/KITS/TRAYS/PACK) IMPLANT
WATER STERILE IRR 1000ML POUR (IV SOLUTION) ×4 IMPLANT
WRAP KNEE MAXI GEL POST OP (GAUZE/BANDAGES/DRESSINGS) ×2 IMPLANT
YANKAUER SUCT BULB TIP 10FT TU (MISCELLANEOUS) ×2 IMPLANT

## 2018-01-20 NOTE — Anesthesia Procedure Notes (Signed)
Anesthesia Regional Block: Adductor canal block   Pre-Anesthetic Checklist: ,, timeout performed, Correct Patient, Correct Site, Correct Laterality, Correct Procedure, Correct Position, site marked, Risks and benefits discussed,  Surgical consent,  Pre-op evaluation,  At surgeon's request and post-op pain management  Laterality: Right  Prep: chloraprep       Needles:  Injection technique: Single-shot  Needle Type: Stimiplex     Needle Length: 9cm  Needle Gauge: 21     Additional Needles:   Procedures:,,,, ultrasound used (permanent image in chart),,,,  Narrative:  Start time: 01/20/2018 12:31 PM End time: 01/20/2018 12:36 PM Injection made incrementally with aspirations every 5 mL.  Performed by: Personally  Anesthesiologist: Lynda Rainwater, MD

## 2018-01-20 NOTE — Anesthesia Preprocedure Evaluation (Signed)
Anesthesia Evaluation  Patient identified by MRN, date of birth, ID band Patient awake    Reviewed: Allergy & Precautions, NPO status , Patient's Chart, lab work & pertinent test results  Airway Mallampati: II  TM Distance: >3 FB Neck ROM: Full    Dental no notable dental hx.    Pulmonary neg pulmonary ROS,    Pulmonary exam normal breath sounds clear to auscultation       Cardiovascular hypertension, Pt. on medications + CAD  Normal cardiovascular exam+ dysrhythmias Atrial Fibrillation  Rhythm:Regular Rate:Normal     Neuro/Psych negative neurological ROS  negative psych ROS   GI/Hepatic negative GI ROS, (+)     substance abuse  alcohol use,   Endo/Other  negative endocrine ROS  Renal/GU negative Renal ROS  negative genitourinary   Musculoskeletal negative musculoskeletal ROS (+)   Abdominal   Peds negative pediatric ROS (+)  Hematology negative hematology ROS (+)   Anesthesia Other Findings   Reproductive/Obstetrics negative OB ROS                             Anesthesia Physical  Anesthesia Plan  ASA: III  Anesthesia Plan:    Post-op Pain Management:    Induction: Intravenous  PONV Risk Score and Plan: 2 and Ondansetron and Midazolam  Airway Management Planned: Simple Face Mask  Additional Equipment:   Intra-op Plan:   Post-operative Plan:   Informed Consent: I have reviewed the patients History and Physical, chart, labs and discussed the procedure including the risks, benefits and alternatives for the proposed anesthesia with the patient or authorized representative who has indicated his/her understanding and acceptance.   Dental advisory given  Plan Discussed with: CRNA  Anesthesia Plan Comments:         Anesthesia Quick Evaluation

## 2018-01-20 NOTE — Op Note (Signed)
OPERATIVE REPORT-TOTAL KNEE ARTHROPLASTY   Pre-operative diagnosis- Osteoarthritis  Right knee(s)  Post-operative diagnosis- Osteoarthritis Right knee(s)  Procedure-  Right  Total Knee Arthroplasty  Surgeon- Dione Plover. Vinicius Brockman, MD  Assistant- Theresa Duty, PA-C   Anesthesia-  Adductor canal block and spinal  EBL-50 mL   Drains Hemovac  Tourniquet time-  Total Tourniquet Time Documented: Thigh (Right) - 40 minutes Total: Thigh (Right) - 40 minutes     Complications- None  Condition-PACU - hemodynamically stable.   Brief Clinical Note  Kathryn Kent is a 82 y.o. year old female with end stage OA of her right knee with progressively worsening pain and dysfunction. She has constant pain, with activity and at rest and significant functional deficits with difficulties even with ADLs. She has had extensive non-op management including analgesics, injections of cortisone and viscosupplements, and home exercise program, but remains in significant pain with significant dysfunction.Radiographs show bone on bone arthritis lateral and patellofemoral. She presents now for right Total Knee Arthroplasty.    Procedure in detail---   The patient is brought into the operating room and positioned supine on the operating table. After successful administration of  Adductor canal block and spinal,   a tourniquet is placed high on the  Right thigh(s) and the lower extremity is prepped and draped in the usual sterile fashion. Time out is performed by the operating team and then the  Right lower extremity is wrapped in Esmarch, knee flexed and the tourniquet inflated to 300 mmHg.       A midline incision is made with a ten blade through the subcutaneous tissue to the level of the extensor mechanism. A fresh blade is used to make a medial parapatellar arthrotomy. Soft tissue over the proximal medial tibia is subperiosteally elevated to the joint line with a knife and into the semimembranosus bursa with  a Cobb elevator. Soft tissue over the proximal lateral tibia is elevated with attention being paid to avoiding the patellar tendon on the tibial tubercle. The patella is everted, knee flexed 90 degrees and the ACL and PCL are removed. Findings are bone on bone lateral and patellofemoral with large global osteophytes        The drill is used to create a starting hole in the distal femur and the canal is thoroughly irrigated with sterile saline to remove the fatty contents. The 5 degree Right  valgus alignment guide is placed into the femoral canal and the distal femoral cutting block is pinned to remove 10 mm off the distal femur. Resection is made with an oscillating saw.      The tibia is subluxed forward and the menisci are removed. The extramedullary alignment guide is placed referencing proximally at the medial aspect of the tibial tubercle and distally along the second metatarsal axis and tibial crest. The block is pinned to remove 70mm off the more deficient lateral  side. Resection is made with an oscillating saw. Size 4is the most appropriate size for the tibia and the proximal tibia is prepared with the modular drill and keel punch for that size.      The femoral sizing guide is placed and size 3 is most appropriate. Rotation is marked off the epicondylar axis and confirmed by creating a rectangular flexion gap at 90 degrees. The size 3 cutting block is pinned in this rotation and the anterior, posterior and chamfer cuts are made with the oscillating saw. The intercondylar block is then placed and that cut is made.  Trial size 4 tibial component, trial size 3 posterior stabilized femur and a 10  mm posterior stabilized rotating platform insert trial is placed. Full extension is achieved with excellent varus/valgus and anterior/posterior balance throughout full range of motion. The patella is everted and thickness measured to be 22  mm. Free hand resection is taken to 12 mm, a 35 template is placed,  lug holes are drilled, trial patella is placed, and it tracks normally. Osteophytes are removed off the posterior femur with the trial in place. All trials are removed and the cut bone surfaces prepared with pulsatile lavage. Cement is mixed and once ready for implantation, the size 3 tibial implant, size  4 posterior stabilized femoral component, and the size 35 patella are cemented in place and the patella is held with the clamp. The trial insert is placed and the knee held in full extension. The Exparel (20 ml mixed with 60 ml saline) is injected into the extensor mechanism, posterior capsule, medial and lateral gutters and subcutaneous tissues.  All extruded cement is removed and once the cement is hard the permanent 10 mm posterior stabilized rotating platform insert is placed into the tibial tray.      The wound is copiously irrigated with saline solution and the extensor mechanism closed over a hemovac drain with #1 V-loc suture. The tourniquet is released for a total tourniquet time of 40  minutes. Flexion against gravity is 140 degrees and the patella tracks normally. Subcutaneous tissue is closed with 2.0 vicryl and subcuticular with running 4.0 Monocryl. The incision is cleaned and dried and steri-strips and a bulky sterile dressing are applied. The limb is placed into a knee immobilizer and the patient is awakened and transported to recovery in stable condition.      Please note that a surgical assistant was a medical necessity for this procedure in order to perform it in a safe and expeditious manner. Surgical assistant was necessary to retract the ligaments and vital neurovascular structures to prevent injury to them and also necessary for proper positioning of the limb to allow for anatomic placement of the prosthesis.   Dione Plover Bayley Hurn, MD    01/20/2018, 3:06 PM

## 2018-01-20 NOTE — Discharge Instructions (Signed)
° °Dr. Frank Aluisio °Total Joint Specialist °[facility] °[address] °[place], [place] [address] °[phone] ° °TOTAL KNEE REPLACEMENT POSTOPERATIVE DIRECTIONS ° °Knee Rehabilitation, Guidelines Following Surgery  °Results after knee surgery are often greatly improved when you follow the exercise, range of motion and muscle strengthening exercises prescribed by your doctor. Safety measures are also important to protect the knee from further injury. Any time any of these exercises cause you to have increased pain or swelling in your knee joint, decrease the amount until you are comfortable again and slowly increase them. If you have problems or questions, call your caregiver or physical therapist for advice.  ° °HOME CARE INSTRUCTIONS  °• Remove items at home which could result in a fall. This includes throw rugs or furniture in walking pathways.  °· ICE to the affected knee every three hours for 30 minutes at a time and then as needed for pain and swelling.  Continue to use ice on the knee for pain and swelling from surgery. You may notice swelling that will progress down to the foot and ankle.  This is normal after surgery.  Elevate the leg when you are not up walking on it.   °· Continue to use the breathing machine which will help keep your temperature down.  It is common for your temperature to cycle up and down following surgery, especially at night when you are not up moving around and exerting yourself.  The breathing machine keeps your lungs expanded and your temperature down. °· Do not place pillow under knee, focus on keeping the knee straight while resting ° °DIET °You may resume your previous home diet once your are discharged from the hospital. ° °DRESSING / WOUND CARE / SHOWERING °You may shower 3 days after surgery, but keep the wounds dry during showering.  You may use an occlusive plastic wrap (Press'n Seal for example), NO SOAKING/SUBMERGING IN THE BATHTUB.  If the bandage  gets wet, change with a clean dry gauze.  If the incision gets wet, pat the wound dry with a clean towel. °You may start showering once you are discharged home but do not submerge the incision under water. Just pat the incision dry and apply a dry gauze dressing on daily. °Change the surgical dressing daily and reapply a dry dressing each time. ° °ACTIVITY °Walk with your walker as instructed. °Use walker as long as suggested by your caregivers. °Avoid periods of inactivity such as sitting longer than an hour when not asleep. This helps prevent blood clots.  °You may resume a sexual relationship in one month or when given the OK by your doctor.  °You may return to work once you are cleared by your doctor.  °Do not drive a car for 6 weeks or until released by you surgeon.  °Do not drive while taking narcotics. ° °WEIGHT BEARING °Weight bearing as tolerated with assist device (walker, cane, etc) as directed, use it as long as suggested by your surgeon or therapist, typically at least 4-6 weeks. ° °POSTOPERATIVE CONSTIPATION PROTOCOL °Constipation - defined medically as fewer than three stools per week and severe constipation as less than one stool per week. ° °One of the most common issues patients have following surgery is constipation.  Even if you have a regular bowel pattern at home, your normal regimen is likely to be disrupted due to multiple reasons following surgery.  Combination of anesthesia, postoperative narcotics, change in appetite and fluid intake all can affect your bowels.    In order to avoid complications following surgery, here are some recommendations in order to help you during your recovery period. ° °Colace (docusate) - Pick up an over-the-counter form of Colace or another stool softener and take twice a day as long as you are requiring postoperative pain medications.  Take with a full glass of water daily.  If you experience loose stools or diarrhea, hold the colace until you stool forms back  up.  If your symptoms do not get better within 1 week or if they get worse, check with your doctor. ° °Dulcolax (bisacodyl) - Pick up over-the-counter and take as directed by the product packaging as needed to assist with the movement of your bowels.  Take with a full glass of water.  Use this product as needed if not relieved by Colace only.  ° °MiraLax (polyethylene glycol) - Pick up over-the-counter to have on hand.  MiraLax is a solution that will increase the amount of water in your bowels to assist with bowel movements.  Take as directed and can mix with a glass of water, juice, soda, coffee, or tea.  Take if you go more than two days without a movement. °Do not use MiraLax more than once per day. Call your doctor if you are still constipated or irregular after using this medication for 7 days in a row. ° °If you continue to have problems with postoperative constipation, please contact the office for further assistance and recommendations.  If you experience "the worst abdominal pain ever" or develop nausea or vomiting, please contact the office immediatly for further recommendations for treatment. ° °ITCHING ° If you experience itching with your medications, try taking only a single pain pill, or even half a pain pill at a time.  You can also use Benadryl over the counter for itching or also to help with sleep.  ° °TED HOSE STOCKINGS °Wear the elastic stockings on both legs for three weeks following surgery during the day but you may remove then at night for sleeping. ° °MEDICATIONS °See your medication summary on the “After Visit Summary” that the nursing staff will review with you prior to discharge.  You may have some home medications which will be placed on hold until you complete the course of blood thinner medication.  It is important for you to complete the blood thinner medication as prescribed by your surgeon.  Continue your approved medications as instructed at time of discharge. ° °PRECAUTIONS °If  you experience chest pain or shortness of breath - call 911 immediately for transfer to the hospital emergency department.  °If you develop a fever greater that 101 F, purulent drainage from wound, increased redness or drainage from wound, foul odor from the wound/dressing, or calf pain - CONTACT YOUR SURGEON.   °                                                °FOLLOW-UP APPOINTMENTS °Make sure you keep all of your appointments after your operation with your surgeon and caregivers. You should call the office at the above phone number and make an appointment for approximately two weeks after the date of your surgery or on the date instructed by your surgeon outlined in the "After Visit Summary". ° ° °RANGE OF MOTION AND STRENGTHENING EXERCISES  °Rehabilitation of the knee is important following a knee injury or   an operation. After just a few days of immobilization, the muscles of the thigh which control the knee become weakened and shrink (atrophy). Knee exercises are designed to build up the tone and strength of the thigh muscles and to improve knee motion. Often times heat used for twenty to thirty minutes before working out will loosen up your tissues and help with improving the range of motion but do not use heat for the first two weeks following surgery. These exercises can be done on a training (exercise) mat, on the floor, on a table or on a bed. Use what ever works the best and is most comfortable for you Knee exercises include:  °• Leg Lifts - While your knee is still immobilized in a splint or cast, you can do straight leg raises. Lift the leg to 60 degrees, hold for 3 sec, and slowly lower the leg. Repeat 10-20 times 2-3 times daily. Perform this exercise against resistance later as your knee gets better.  °• Quad and Hamstring Sets - Tighten up the muscle on the front of the thigh (Quad) and hold for 5-10 sec. Repeat this 10-20 times hourly. Hamstring sets are done by pushing the foot backward against an  object and holding for 5-10 sec. Repeat as with quad sets.  °· Leg Slides: Lying on your back, slowly slide your foot toward your buttocks, bending your knee up off the floor (only go as far as is comfortable). Then slowly slide your foot back down until your leg is flat on the floor again. °· Angel Wings: Lying on your back spread your legs to the side as far apart as you can without causing discomfort.  °A rehabilitation program following serious knee injuries can speed recovery and prevent re-injury in the future due to weakened muscles. Contact your doctor or a physical therapist for more information on knee rehabilitation.  ° °IF YOU ARE TRANSFERRED TO A SKILLED REHAB FACILITY °If the patient is transferred to a skilled rehab facility following release from the hospital, a list of the current medications will be sent to the facility for the patient to continue.  When discharged from the skilled rehab facility, please have the facility set up the patient's Home Health Physical Therapy prior to being released. Also, the skilled facility will be responsible for providing the patient with their medications at time of release from the facility to include their pain medication, the muscle relaxants, and their blood thinner medication. If the patient is still at the rehab facility at time of the two week follow up appointment, the skilled rehab facility will also need to assist the patient in arranging follow up appointment in our office and any transportation needs. ° °MAKE SURE YOU:  °• Understand these instructions.  °• Get help right away if you are not doing well or get worse.  ° ° °Pick up stool softner and laxative for home use following surgery while on pain medications. °Do not submerge incision under water. °Please use good hand washing techniques while changing dressing each day. °May shower starting three days after surgery. °Please use a clean towel to pat the incision dry following showers. °Continue to  use ice for pain and swelling after surgery. °Do not use any lotions or creams on the incision until instructed by your surgeon. ° °

## 2018-01-20 NOTE — Plan of Care (Signed)
No changes needed to care plans at this time.

## 2018-01-20 NOTE — Anesthesia Postprocedure Evaluation (Signed)
Anesthesia Post Note  Patient: Kathryn Kent  Procedure(s) Performed: RIGHT TOTAL KNEE ARTHROPLASTY (Right Knee)     Patient location during evaluation: PACU Anesthesia Type: Spinal Level of consciousness: oriented and awake and alert Pain management: pain level controlled Vital Signs Assessment: post-procedure vital signs reviewed and stable Respiratory status: spontaneous breathing and respiratory function stable Cardiovascular status: blood pressure returned to baseline and stable Postop Assessment: no headache, no backache and no apparent nausea or vomiting Anesthetic complications: no    Last Vitals:  Vitals:   01/20/18 1642 01/20/18 1652  BP: 131/81 131/68  Pulse: 62 60  Resp: 18   Temp: 36.4 C 36.4 C  SpO2: 100% 100%    Last Pain:  Vitals:   01/20/18 1700  TempSrc:   PainSc: 0-No pain                 Lynda Rainwater

## 2018-01-20 NOTE — Anesthesia Procedure Notes (Signed)
Spinal  Start time: 01/20/2018 2:55 PM End time: 01/20/2018 2:59 PM Staffing Resident/CRNA: Victoriano Lain, CRNA Performed: resident/CRNA  Preanesthetic Checklist Completed: patient identified, site marked, surgical consent, pre-op evaluation, timeout performed, IV checked, risks and benefits discussed and monitors and equipment checked Spinal Block Patient position: sitting Prep: site prepped and draped and DuraPrep Patient monitoring: heart rate, continuous pulse ox and blood pressure Approach: midline Location: L3-4 Injection technique: single-shot Needle Needle type: Pencan  Needle gauge: 24 G Needle length: 10 cm Assessment Sensory level: T4 Additional Notes Pt placed in sitting position for spinal placement. Spinal kit expiration checked and verified. + CSF, - heme. Pt tolerated well and placed supine after spinal placement.

## 2018-01-20 NOTE — Interval H&P Note (Signed)
History and Physical Interval Note:  01/20/2018 11:34 AM  Kathryn Kent  has presented today for surgery, with the diagnosis of RIGHT knee osteoarthritis  The various methods of treatment have been discussed with the patient and family. After consideration of risks, benefits and other options for treatment, the patient has consented to  Procedure(s): RIGHT TOTAL KNEE ARTHROPLASTY (Right) as a surgical intervention .  The patient's history has been reviewed, patient examined, no change in status, stable for surgery.  I have reviewed the patient's chart and labs.  Questions were answered to the patient's satisfaction.     Pilar Plate Mclain Freer

## 2018-01-20 NOTE — Transfer of Care (Signed)
Immediate Anesthesia Transfer of Care Note  Patient: Kathryn Kent  Procedure(s) Performed: RIGHT TOTAL KNEE ARTHROPLASTY (Right Knee)  Patient Location: PACU  Anesthesia Type:Spinal  Level of Consciousness: awake, alert , oriented and patient cooperative  Airway & Oxygen Therapy: Patient Spontanous Breathing and Patient connected to face mask oxygen  Post-op Assessment: Report given to RN, Post -op Vital signs reviewed and stable and Patient moving all extremities  Post vital signs: Reviewed and stable  Last Vitals:  Vitals Value Taken Time  BP 121/76 01/20/2018  3:45 PM  Temp    Pulse 65 01/20/2018  3:45 PM  Resp 21 01/20/2018  3:45 PM  SpO2 100 % 01/20/2018  3:45 PM  Vitals shown include unvalidated device data.  Last Pain:  Vitals:   01/20/18 1248  PainSc: 0-No pain      Patients Stated Pain Goal: 4 (28/41/32 4401)  Complications: No apparent anesthesia complications

## 2018-01-20 NOTE — Progress Notes (Signed)
AssistedDr. Miller with right, ultrasound guided, adductor canal block. Side rails up, monitors on throughout procedure. See vital signs in flow sheet. Tolerated Procedure well.  

## 2018-01-20 NOTE — Progress Notes (Signed)
Pt to floor from pacu in stable condition at 1640. Report received from jess good rn at bedside. Pt denies pain on arrival to floor.

## 2018-01-21 ENCOUNTER — Encounter (HOSPITAL_COMMUNITY): Payer: Self-pay | Admitting: Orthopedic Surgery

## 2018-01-21 LAB — BASIC METABOLIC PANEL
ANION GAP: 8 (ref 5–15)
BUN: 12 mg/dL (ref 8–23)
CHLORIDE: 102 mmol/L (ref 98–111)
CO2: 29 mmol/L (ref 22–32)
Calcium: 9.2 mg/dL (ref 8.9–10.3)
Creatinine, Ser: 0.81 mg/dL (ref 0.44–1.00)
GFR calc Af Amer: >60 mL/min (ref >60)
Glucose, Bld: 253 mg/dL — ABNORMAL HIGH (ref 70–99)
Potassium: 3.2 mmol/L — ABNORMAL LOW (ref 3.5–5.1)
SODIUM: 139 mmol/L (ref 135–145)

## 2018-01-21 LAB — CBC
HCT: 35.3 % — ABNORMAL LOW (ref 36.0–46.0)
HEMOGLOBIN: 11.8 g/dL — AB (ref 12.0–15.0)
MCH: 29.8 pg (ref 26.0–34.0)
MCHC: 33.4 g/dL (ref 30.0–36.0)
MCV: 89.1 fL (ref 78.0–100.0)
PLATELETS: 193 10*3/uL (ref 150–400)
RBC: 3.96 MIL/uL (ref 3.87–5.11)
RDW: 13.6 % (ref 11.5–15.5)
WBC: 6.6 10*3/uL (ref 4.0–10.5)

## 2018-01-21 MED ORDER — POTASSIUM CHLORIDE CRYS ER 20 MEQ PO TBCR
40.0000 meq | EXTENDED_RELEASE_TABLET | ORAL | Status: AC
  Administered 2018-01-21 (×2): 40 meq via ORAL
  Filled 2018-01-21 (×2): qty 2

## 2018-01-21 MED ORDER — TRAMADOL HCL 50 MG PO TABS: 50.0000 mg | ORAL_TABLET | Freq: Four times a day (QID) | ORAL | Status: DC | PRN

## 2018-01-21 NOTE — Progress Notes (Signed)
Care Plan Notes 10/23/2017 to 01/21/2018       Care Plan by Leonides Grills A at 01/16/2018  3:35 PM    Date of Service   Author Author Type Status Note Type File Time  01/16/2018  Lauer, [place] Plan 01/16/2018             Ortho Bundle R TKA scheduled on 01-20-18 DCP:  Home with friends.  2 story home with 3 ste.   DME:  No needs. Rx given at H&P appt for a RW and 3-in-1 PT:  Virtual PT.

## 2018-01-21 NOTE — Progress Notes (Signed)
Physical Therapy Treatment Patient Details Name: DANIEL JOHNDROW MRN: [id number] DOB: Nov 02, 1935 Today's Date: 01/21/2018    History of Present Illness 82 yo female s/p R TKA 01/20/18. Hx of alcoholism, A fib    PT Comments    Progressing with mobility.    Follow Up Recommendations  Follow surgeon's recommendation for DC plan and follow-up therapies     Equipment Recommendations  3in1;Rolling walker with 5" wheels    Recommendations for Other Services       Precautions / Restrictions Precautions Precautions: Fall Required Braces or Orthoses: Knee Immobilizer - Right Knee Immobilizer - Right: Discontinue once straight leg raise with < 10 degree lag(Ind SLR on today. Did not use KI) Restrictions Weight Bearing Restrictions: No Other Position/Activity Restrictions: WBAT    Mobility  Bed Mobility Overal bed mobility: Needs Assistance Bed Mobility: Sit to Supine     Supine to sit: Supervision Sit to supine: Supervision   General bed mobility comments: for safety  Transfers Overall transfer level: Needs assistance Equipment used: Rolling walker (2 wheeled) Transfers: Sit to/from Stand Sit to Stand: Min guard         General transfer comment: close guard for safety. VCs safety, hand placement.   Ambulation/Gait Ambulation/Gait assistance: Min assist Gait Distance (Feet): 115 Feet Assistive device: Rolling walker (2 wheeled) Gait Pattern/deviations: Step-to pattern;Step-through pattern;Decreased stride length;Trunk flexed     General Gait Details: Cues for safety, sequence, proper use of RW, posture. Intermittent assist to steady.    Stairs             Wheelchair Mobility    Modified Rankin (Stroke Patients Only)       Balance Overall balance assessment: Mild deficits observed, not formally tested                                          Cognition Arousal/Alertness: Awake/alert Behavior During Therapy: WFL for tasks  assessed/performed Overall Cognitive Status: Within Functional Limits for tasks assessed                                        Exercises Total Joint Exercises Ankle Circles/Pumps: AROM;Both;10 reps;Supine Quad Sets: AROM;Both;10 reps;Supine Heel Slides: AROM;AAROM;Right;10 reps;Supine Hip ABduction/ADduction: AROM;Right;10 reps;Supine Straight Leg Raises: AROM;Right;10 reps;Supine Goniometric ROM: ~10-80 degrees    General Comments        Pertinent Vitals/Pain Pain Assessment: 0-10 Pain Score: 4  Pain Location: R knee with activity Pain Descriptors / Indicators: Sore Pain Intervention(s): Monitored during session;Repositioned;Ice applied    Home Living Family/patient expects to be discharged to:: Private residence Living Arrangements: Alone Available Help at Discharge: Friend(s) Type of Home: House Home Access: Stairs to enter Entrance Stairs-Rails: None Home Layout: Able to live on main level with bedroom/bathroom;Two level        Prior Function Level of Independence: Independent          PT Goals (current goals can now be found in the care plan section) Acute Rehab PT Goals Patient Stated Goal: home. regain independence/PLOF PT Goal Formulation: With patient Time For Goal Achievement: 02/04/18 Potential to Achieve Goals: Good Progress towards PT goals: Progressing toward goals    Frequency    7X/week      PT Plan Current plan remains appropriate    Co-evaluation  AM-PAC PT "6 Clicks" Daily Activity  Outcome Measure  Difficulty turning over in bed (including adjusting bedclothes, sheets and blankets)?: A Little Difficulty moving from lying on back to sitting on the side of the bed? : A Little Difficulty sitting down on and standing up from a chair with arms (e.g., wheelchair, bedside commode, etc,.)?: A Little Help needed moving to and from a bed to chair (including a wheelchair)?: A Little Help needed walking in  hospital room?: A Little Help needed climbing 3-5 steps with a railing? : A Lot 6 Click Score: 17    End of Session Equipment Utilized During Treatment: Gait belt Activity Tolerance: Patient tolerated treatment well Patient left: in bed;with call bell/phone within reach   PT Visit Diagnosis: Other abnormalities of gait and mobility (R26.89);Pain Pain - Right/Left: Right Pain - part of body: Knee     Time: 0175-1025 PT Time Calculation (min) (ACUTE ONLY): 14 min  Charges:  $Gait Training: 8-22 mins                        Weston Anna, MPT Pager: [phone]

## 2018-01-21 NOTE — Evaluation (Signed)
Physical Therapy Evaluation Patient Details Name: Kathryn Kent MRN: [id number] DOB: [birth year] Today's Date: 01/21/2018   History of Present Illness  82 yo female s/p R TKA 01/20/18. Hx of alcoholism, A fib  Clinical Impression  On eval, pt required Min assist for mobility. She walked ~115 feet with a RW. Minimal pain with activity. Will progress activity as tolerated. Plan is for d/c home with [facility].     Follow Up Recommendations Follow surgeon's recommendation for DC plan and follow-up therapies    Equipment Recommendations  3in1;Rolling walker with 5" wheels    Recommendations for Other Services       Precautions / Restrictions Precautions Precautions: Fall Required Braces or Orthoses: Knee Immobilizer - Right Knee Immobilizer - Right: Discontinue once straight leg raise with < 10 degree lag(Ind SLR on 7/30-did not use KI) Restrictions Weight Bearing Restrictions: No Other Position/Activity Restrictions: WBAT      Mobility  Bed Mobility Overal bed mobility: Needs Assistance Bed Mobility: Supine to Sit     Supine to sit: Supervision     General bed mobility comments: for safety, lines.   Transfers Overall transfer level: Needs assistance Equipment used: Rolling walker (2 wheeled) Transfers: Sit to/from Stand Sit to Stand: Min assist         General transfer comment: VCs safety, technique, hand placement. Assist to steady.   Ambulation/Gait Ambulation/Gait assistance: Min assist Gait Distance (Feet): 115 Feet Assistive device: Rolling walker (2 wheeled) Gait Pattern/deviations: Step-to pattern;Step-through pattern;Decreased stride length     General Gait Details: Intermittent assist to steady. VCs safety, sequence, distance from walker. Pt able to transition to reciprocal gait pattern as distance progressed.   Stairs            Wheelchair Mobility    Modified Rankin (Stroke Patients Only)       Balance Overall balance assessment: Mild  deficits observed, not formally tested                                           Pertinent Vitals/Pain Pain Assessment: 0-10 Pain Score: 3  Pain Location: R knee with activity Pain Descriptors / Indicators: Sore Pain Intervention(s): Monitored during session;Repositioned;Ice applied    Home Living Family/patient expects to be discharged to:: Private residence Living Arrangements: Alone Available Help at Discharge: Friend(s) Type of Home: House Home Access: Stairs to enter Entrance Stairs-Rails: None Entrance Stairs-Number of Steps: 3 Home Layout: Able to live on main level with bedroom/bathroom;Two level        Prior Function Level of Independence: Independent               Hand Dominance        Extremity/Trunk Assessment   Upper Extremity Assessment Upper Extremity Assessment: Overall WFL for tasks assessed    Lower Extremity Assessment Lower Extremity Assessment: Generalized weakness(s/p R TKA)    Cervical / Trunk Assessment Cervical / Trunk Assessment: Normal  Communication   Communication: No difficulties  Cognition Arousal/Alertness: Awake/alert Behavior During Therapy: WFL for tasks assessed/performed Overall Cognitive Status: Within Functional Limits for tasks assessed                                        General Comments      Exercises Total Joint Exercises Ankle Circles/Pumps: AROM;Both;10  reps;Supine Quad Sets: AROM;Both;10 reps;Supine Heel Slides: AROM;AAROM;Right;10 reps;Supine Hip ABduction/ADduction: AROM;Right;10 reps;Supine Straight Leg Raises: AROM;Right;10 reps;Supine Goniometric ROM: ~10-80 degrees   Assessment/Plan    PT Assessment Patient needs continued PT services  PT Problem List Decreased strength;Decreased range of motion;Decreased balance;Decreased mobility;Pain;Decreased knowledge of use of DME       PT Treatment Interventions DME instruction;Gait training;Functional mobility  training;Therapeutic activities;Balance training;Patient/family education;Stair training;Therapeutic exercise    PT Goals (Current goals can be found in the Care Plan section)  Acute Rehab PT Goals Patient Stated Goal: home. regain independence/PLOF PT Goal Formulation: With patient Time For Goal Achievement: 02/04/18 Potential to Achieve Goals: Good    Frequency 7X/week   Barriers to discharge        Co-evaluation               AM-PAC PT "6 Clicks" Daily Activity  Outcome Measure Difficulty turning over in bed (including adjusting bedclothes, sheets and blankets)?: A Little Difficulty moving from lying on back to sitting on the side of the bed? : A Little Difficulty sitting down on and standing up from a chair with arms (e.g., wheelchair, bedside commode, etc,.)?: Unable Help needed moving to and from a bed to chair (including a wheelchair)?: A Little Help needed walking in hospital room?: A Little Help needed climbing 3-5 steps with a railing? : A Lot 6 Click Score: 15    End of Session Equipment Utilized During Treatment: Gait belt Activity Tolerance: Patient tolerated treatment well Patient left: in chair;with call bell/phone within reach   PT Visit Diagnosis: Other abnormalities of gait and mobility (R26.89);Pain Pain - Right/Left: Right Pain - part of body: Knee    Time: 4627-0350 PT Time Calculation (min) (ACUTE ONLY): 26 min   Charges:   PT Evaluation $PT Eval Low Complexity: 1 Low PT Treatments $Gait Training: 8-22 mins          Weston Anna, MPT Pager: [phone]

## 2018-01-21 NOTE — Progress Notes (Signed)
Subjective: 1 Day Post-Op Procedure(s) (LRB): RIGHT TOTAL KNEE ARTHROPLASTY (Right) Patient reports pain as mild to moderate..   Patient seen in rounds by Dr. Wynelle Link. Patient is well, and has had no acute complaints or problems other than pain in the right knee. Foley catheter to be removed this AM. Denies chest pain, SOB, or calf pain. We will start therapy today.   Objective: Vital signs in last 24 hours: Temp:  [97.5 F (36.4 C)-98.1 F (36.7 C)] 97.7 F (36.5 C) (07/30 0515) Pulse Rate:  [59-83] 69 (07/30 0515) Resp:  [13-22] 18 (07/30 0515) BP: (114-161)/(68-97) 126/78 (07/30 0515) SpO2:  [98 %-100 %] 100 % (07/30 0515) Weight:  [93 kg (205 lb)] 93 kg (205 lb) (07/29 1127)  Intake/Output from previous day:  Intake/Output Summary (Last 24 hours) at 01/21/2018 0733 Last data filed at 01/21/2018 0555 Gross per 24 hour  Intake 4633.33 ml  Output 2645 ml  Net 1988.33 ml     Labs: Recent Labs    01/21/18 0501  HGB 11.8*   Recent Labs    01/21/18 0501  WBC 6.6  RBC 3.96  HCT 35.3*  PLT 193   Recent Labs    01/21/18 0501  NA 139  K 3.2*  CL 102  CO2 29  BUN 12  CREATININE 0.81  GLUCOSE 253*  CALCIUM 9.2    Exam: General - Patient is Alert and Oriented Extremity - Neurologically intact Neurovascular intact Sensation intact distally Dorsiflexion/Plantar flexion intact Dressing - dressing C/D/I Motor Function - intact, moving foot and toes well on exam.   Past Medical History:  Diagnosis Date  . Alcoholism ([place])   . Anemia   . Atrial fibrillation ([place])   . Chicken pox   . Colon polyps   . Coronary artery disease    status post cardiac catheterization performed 10/16/05 revealing noncritical CAD  . Cyst of left kidney    small  . Diverticulosis   . Fibroid tumor   . Heart murmur   . History of kidney stones   . Hyperlipidemia   . Hypertension   . IFG (impaired fasting glucose)   . Liver cyst    small  . Nephrolithiasis   . Obesity   .  OSA (obstructive sleep apnea)    CPAP nightly  . Osteoarthritis   . Periumbilical hernia    Moderate  . Shingles   . Vulvar cyst     Assessment/Plan: 1 Day Post-Op Procedure(s) (LRB): RIGHT TOTAL KNEE ARTHROPLASTY (Right) Principal Problem:   OA (osteoarthritis) of knee  Estimated body mass index is 35.19 kg/m as calculated from the following:   Height as of this encounter: 5\' 4"  (1.626 m).   Weight as of this encounter: 93 kg (205 lb). Advance diet Up with therapy  Anticipated LOS equal to or greater than 2 midnights due to - Age 80 and older with one or more of the following:  - Obesity  - Expected need for hospital services (PT, OT, Nursing) required for safe  discharge  - Anticipated need for postoperative skilled nursing care or inpatient rehab  - Active co-morbidities: Coronary Artery Disease and Cardiac Arrhythmia OR   - Unanticipated findings during/Post Surgery: None  - Patient is a high risk of re-admission due to: None    DVT Prophylaxis - Aspirin Weight bearing as tolerated. D/C O2 and pulse ox and try on room air. Hemovac pulled without difficulty, will begin therapy today.  Potassium down to 3.2 this AM, 2  doses of 70mEq KCl ordered. Will reassess with BMP tomorrow AM.  Plan is to go Home after hospital stay. Possible d/c tomorrow if meeting goals.  Theresa Duty, PA-C Orthopedic Surgery 01/21/2018, 7:33 AM

## 2018-01-21 NOTE — Progress Notes (Signed)
Spoke with patient at bedside. Confirmed plan for Vertual PT, already arranged. Needs a RW and 3n1, contacted [facility] to deliver to the room. [phone]

## 2018-01-22 LAB — BASIC METABOLIC PANEL
ANION GAP: 9 (ref 5–15)
BUN: 18 mg/dL (ref 8–23)
CO2: 31 mmol/L (ref 22–32)
Calcium: 9.7 mg/dL (ref 8.9–10.3)
Chloride: 102 mmol/L (ref 98–111)
Creatinine, Ser: 0.76 mg/dL (ref 0.44–1.00)
GFR calc Af Amer: >60 mL/min (ref >60)
GFR calc non Af Amer: >60 mL/min (ref >60)
Glucose, Bld: 151 mg/dL — ABNORMAL HIGH (ref 70–99)
POTASSIUM: 4 mmol/L (ref 3.5–5.1)
SODIUM: 142 mmol/L (ref 135–145)

## 2018-01-22 LAB — CBC
HCT: 34 % — ABNORMAL LOW (ref 36.0–46.0)
Hemoglobin: 11.3 g/dL — ABNORMAL LOW (ref 12.0–15.0)
MCH: 30.1 pg (ref 26.0–34.0)
MCHC: 33.2 g/dL (ref 30.0–36.0)
MCV: 90.7 fL (ref 78.0–100.0)
PLATELETS: 176 10*3/uL (ref 150–400)
RBC: 3.75 MIL/uL — AB (ref 3.87–5.11)
RDW: 13.9 % (ref 11.5–15.5)
WBC: 9.6 10*3/uL (ref 4.0–10.5)

## 2018-01-22 MED ORDER — ASPIRIN 325 MG PO TBEC: 325.0000 mg | DELAYED_RELEASE_TABLET | Freq: Two times a day (BID) | ORAL | 0 refills | Status: AC

## 2018-01-22 MED ORDER — OXYCODONE HCL 5 MG PO TABS: 5.0000 mg | ORAL_TABLET | ORAL | 0 refills | Status: DC | PRN

## 2018-01-22 MED ORDER — ASPIRIN 325 MG PO TBEC: 325.0000 mg | DELAYED_RELEASE_TABLET | Freq: Two times a day (BID) | ORAL | 0 refills | Status: DC

## 2018-01-22 MED ORDER — METHOCARBAMOL 500 MG PO TABS: 500.0000 mg | ORAL_TABLET | Freq: Four times a day (QID) | ORAL | 0 refills | Status: DC | PRN

## 2018-01-22 MED ORDER — TRAMADOL HCL 50 MG PO TABS: 50.0000 mg | ORAL_TABLET | Freq: Four times a day (QID) | ORAL | 0 refills | Status: DC | PRN

## 2018-01-22 NOTE — Progress Notes (Signed)
Physical Therapy Treatment Patient Details Name: Kathryn Kent MRN: [id number] DOB: 03/24/1936 Today's Date: 01/22/2018    History of Present Illness 82 yo female s/p R TKA 01/20/18. Hx of alcoholism, A fib    PT Comments    Pt continues to participate well. Will plan to have a 2nd session prior to d/c later today.    Follow Up Recommendations  Follow surgeon's recommendation for DC plan and follow-up therapies     Equipment Recommendations  3in1 ;Rolling walker with 5" wheels    Recommendations for Other Services       Precautions / Restrictions Precautions Precautions: Fall Required Braces or Orthoses: Knee Immobilizer - Right Knee Immobilizer - Right: Discontinue once straight leg raise with < 10 degree lag(Ind SLR. KI not used. ) Restrictions Weight Bearing Restrictions: No Other Position/Activity Restrictions: WBAT    Mobility  Bed Mobility Overal bed mobility: Needs Assistance Bed Mobility: Supine to Sit     Supine to sit: Supervision     General bed mobility comments: for safety  Transfers Overall transfer level: Needs assistance Equipment used: Rolling walker (2 wheeled) Transfers: Sit to/from Stand Sit to Stand: Min guard         General transfer comment: close guard for safety. VCs safety, hand placement. Some difficulty rising from low bed surface.   Ambulation/Gait Ambulation/Gait assistance: Min guard Gait Distance (Feet): 125 Feet Assistive device: Rolling walker (2 wheeled) Gait Pattern/deviations: Step-to pattern;Step-through pattern;Decreased stride length     General Gait Details: close guard for safety. VCs safety, sequence, posture, distance from RW.    Stairs             Wheelchair Mobility    Modified Rankin (Stroke Patients Only)       Balance Overall balance assessment: Mild deficits observed, not formally tested                                          Cognition Arousal/Alertness:  Awake/alert Behavior During Therapy: WFL for tasks assessed/performed Overall Cognitive Status: Within Functional Limits for tasks assessed                                        Exercises Total Joint Exercises Ankle Circles/Pumps: AROM;Both;10 reps;Supine Quad Sets: AROM;Both;10 reps;Supine Heel Slides: AROM;AAROM;Right;10 reps;Supine Hip ABduction/ADduction: AROM;Right;10 reps;Supine Straight Leg Raises: AROM;Right;10 reps;Supine Goniometric ROM: ~10-60 degrees (limited by tape on knee-RN placed due to bloody drainage)    General Comments        Pertinent Vitals/Pain Pain Assessment: 0-10 Pain Score: 4  Pain Location: R knee with activity Pain Descriptors / Indicators: Sore Pain Intervention(s): Monitored during session;Repositioned;Ice applied    Home Living                      Prior Function            PT Goals (current goals can now be found in the care plan section) Progress towards PT goals: Progressing toward goals    Frequency    7X/week      PT Plan Current plan remains appropriate    Co-evaluation              AM-PAC PT "6 Clicks" Daily Activity  Outcome Measure  Difficulty turning over in bed (including  adjusting bedclothes, sheets and blankets)?: A Little Difficulty moving from lying on back to sitting on the side of the bed? : A Little Difficulty sitting down on and standing up from a chair with arms (e.g., wheelchair, bedside commode, etc,.)?: A Little Help needed moving to and from a bed to chair (including a wheelchair)?: A Little Help needed walking in hospital room?: A Little Help needed climbing 3-5 steps with a railing? : A Lot 6 Click Score: 17    End of Session Equipment Utilized During Treatment: Gait belt Activity Tolerance: Patient tolerated treatment well Patient left: in chair;with call bell/phone within reach   PT Visit Diagnosis: Other abnormalities of gait and mobility (R26.89);Pain Pain -  Right/Left: Right Pain - part of body: Knee     Time: 7353-2992 PT Time Calculation (min) (ACUTE ONLY): 13 min  Charges:  $Gait Training: 8-22 mins                       Weston Anna, MPT Pager: [phone]

## 2018-01-22 NOTE — Progress Notes (Signed)
   Subjective: 2 Days Post-Op Procedure(s) (LRB): RIGHT TOTAL KNEE ARTHROPLASTY (Right) Patient reports pain as mild.   Patient seen in rounds by Dr. Wynelle Link. Patient is well, and has had no acute complaints or problems. States she is ready to go home. Denies chest pain, SOB, or calf pain. Voiding without difficulty and positive flatus.  Plan is to go Home after hospital stay.  Objective: Vital signs in last 24 hours: Temp:  [98.1 F (36.7 C)-98.3 F (36.8 C)] 98.3 F (36.8 C) (07/31 0522) Pulse Rate:  [76-93] 76 (07/31 0522) Resp:  [16-18] 18 (07/31 0522) BP: (143-170)/(80-94) 143/87 (07/31 0522) SpO2:  [95 %-100 %] 99 % (07/31 0522)  Intake/Output from previous day:  Intake/Output Summary (Last 24 hours) at 01/22/2018 0707 Last data filed at 01/22/2018 0524 Gross per 24 hour  Intake 1523.75 ml  Output 700 ml  Net 823.75 ml    Intake/Output this shift: No intake/output data recorded.  Labs: Recent Labs    01/21/18 0501 01/22/18 0516  HGB 11.8* 11.3*   Recent Labs    01/21/18 0501 01/22/18 0516  WBC 6.6 9.6  RBC 3.96 3.75*  HCT 35.3* 34.0*  PLT 193 176   Recent Labs    01/21/18 0501 01/22/18 0516  NA 139 142  K 3.2* 4.0  CL 102 102  CO2 29 31  BUN 12 18  CREATININE 0.81 0.76  GLUCOSE 253* 151*  CALCIUM 9.2 9.7   No results for input(s): LABPT, INR in the last 72 hours.  Exam: General - Patient is Alert and Oriented Extremity - Neurologically intact Neurovascular intact Sensation intact distally Dorsiflexion/Plantar flexion intact Dressing/Incision - clean, dry, no drainage Motor Function - intact, moving foot and toes well on exam.   Past Medical History:  Diagnosis Date  . Alcoholism ([place])   . Anemia   . Atrial fibrillation ([place])   . Chicken pox   . Colon polyps   . Coronary artery disease    status post cardiac catheterization performed 10/16/05 revealing noncritical CAD  . Cyst of left kidney    small  . Diverticulosis   . Fibroid  tumor   . Heart murmur   . History of kidney stones   . Hyperlipidemia   . Hypertension   . IFG (impaired fasting glucose)   . Liver cyst    small  . Nephrolithiasis   . Obesity   . OSA (obstructive sleep apnea)    CPAP nightly  . Osteoarthritis   . Periumbilical hernia    Moderate  . Shingles   . Vulvar cyst     Assessment/Plan: 2 Days Post-Op Procedure(s) (LRB): RIGHT TOTAL KNEE ARTHROPLASTY (Right) Principal Problem:   OA (osteoarthritis) of knee  Estimated body mass index is 35.19 kg/m as calculated from the following:   Height as of this encounter: 5\' 4"  (1.626 m).   Weight as of this encounter: 93 kg (205 lb). Up with therapy D/C IV fluids  DVT Prophylaxis - Aspirin Weight-bearing as tolerated  Potassium improved at 4.0 today. Plan for discharge to home if meeting goals. Scheduled for virtual physical therapy. Follow-up in the office in 2 weeks with Dr. Wynelle Link.   Theresa Duty, PA-C Orthopedic Surgery 01/22/2018, 7:07 AM

## 2018-01-22 NOTE — Progress Notes (Addendum)
Physical Therapy Treatment Patient Details Name: Kathryn Kent MRN: [id number] DOB: February 04, 1936 Today's Date: 01/22/2018    History of Present Illness 82 yo female s/p R TKA 01/20/18. Hx of alcoholism, A fib    PT Comments    Progressing with mobility. Reviewed gait training and stair negotiation training. All education completed. Okay to d/c from PT standpoint-made RN aware.   Follow Up Recommendations  Follow surgeon's recommendation for DC plan and follow-up therapies     Equipment Recommendations  3in1;Rolling walker with 5" wheels    Recommendations for Other Services       Precautions / Restrictions Precautions Precautions: Fall Required Braces or Orthoses: Knee Immobilizer - Right Knee Immobilizer - Right: Discontinue once straight leg raise with < 10 degree lag(Ind SLR. KI not used) Restrictions Weight Bearing Restrictions: No Other Position/Activity Restrictions: WBAT    Mobility  Bed Mobility Overal bed mobility: Needs Assistance Bed Mobility: Supine to Sit     Supine to sit: Supervision     General bed mobility comments: oob in recliner  Transfers Overall transfer level: Needs assistance Equipment used: Rolling walker (2 wheeled) Transfers: Sit to/from Stand Sit to Stand: Min guard         General transfer comment: close guard for safety. VCs safety, hand placement. Some difficulty rising from low surfaces.   Ambulation/Gait Ambulation/Gait assistance: Min guard Gait Distance (Feet): 100 Feet Assistive device: Rolling walker (2 wheeled) Gait Pattern/deviations: Step-through pattern;Decreased stride length     General Gait Details: close guard for safety. VCs safety, sequence, posture, distance from RW.    Stairs Stairs: Yes Stairs assistance: Min assist Stair Management: Forwards;Step to pattern;With walker Number of Stairs: 2 General stair comments: up and over portable steps. VCs safety, sequence, technique. Friend present to observe  and help assist with stabilizing walker. Assist only needed to stabilize walker.    Wheelchair Mobility    Modified Rankin (Stroke Patients Only)       Balance Overall balance assessment: Mild deficits observed, not formally tested                                          Cognition Arousal/Alertness: Awake/alert Behavior During Therapy: WFL for tasks assessed/performed Overall Cognitive Status: Within Functional Limits for tasks assessed                                        Exercises    General Comments        Pertinent Vitals/Pain Pain Assessment: 0-10 Pain Score: 4  Pain Location: R knee with activity Pain Descriptors / Indicators: Sore Pain Intervention(s): Monitored during session;Repositioned    Home Living                      Prior Function            PT Goals (current goals can now be found in the care plan section) Progress towards PT goals: Progressing toward goals    Frequency    7X/week      PT Plan Current plan remains appropriate    Co-evaluation              AM-PAC PT "6 Clicks" Daily Activity  Outcome Measure  Difficulty turning over in bed (including adjusting bedclothes, sheets and  blankets)?: A Little Difficulty moving from lying on back to sitting on the side of the bed? : A Little Difficulty sitting down on and standing up from a chair with arms (e.g., wheelchair, bedside commode, etc,.)?: A Little Help needed moving to and from a bed to chair (including a wheelchair)?: A Little Help needed walking in hospital room?: A Little Help needed climbing 3-5 steps with a railing? : A Little 6 Click Score: 18    End of Session Equipment Utilized During Treatment: Gait belt Activity Tolerance: Patient tolerated treatment well Patient left: in chair;with call bell/phone within reach;with family/visitor present   PT Visit Diagnosis: Other abnormalities of gait and mobility  (R26.89);Pain Pain - Right/Left: Right Pain - part of body: Knee     Time: 3953-2023 PT Time Calculation (min) (ACUTE ONLY): 17 min  Charges:  $Gait Training: 8-22 mins                        Weston Anna, MPT Pager: [phone]

## 2018-01-22 NOTE — Progress Notes (Signed)
Patient discharged to home with friend. Given all belongings, instructions, prescriptions, equipment. Patient verbalized understanding of all instructions. Patient expressed concerns that asa may cause bp to increase, instructed patient to monitor bp at home and follow up w/ pcp. Escorted to pov via w/c.

## 2018-01-23 NOTE — Discharge Summary (Signed)
Physician Discharge Summary   Patient ID: Kathryn Kent MRN: [id number] DOB/AGE: 03-03-1936 82 y.o.  Admit date: 01/20/2018 Discharge date: 01/22/2018  Primary Diagnosis: Osteoarthritis right knee   Admission Diagnoses:  Past Medical History:  Diagnosis Date  . Alcoholism ([place])   . Anemia   . Atrial fibrillation ([place])   . Chicken pox   . Colon polyps   . Coronary artery disease    status post cardiac catheterization performed 10/16/05 revealing noncritical CAD  . Cyst of left kidney    small  . Diverticulosis   . Fibroid tumor   . Heart murmur   . History of kidney stones   . Hyperlipidemia   . Hypertension   . IFG (impaired fasting glucose)   . Liver cyst    small  . Nephrolithiasis   . Obesity   . OSA (obstructive sleep apnea)    CPAP nightly  . Osteoarthritis   . Periumbilical hernia    Moderate  . Shingles   . Vulvar cyst    Discharge Diagnoses:   Principal Problem:   OA (osteoarthritis) of knee  Estimated body mass index is 35.19 kg/m as calculated from the following:   Height as of this encounter: '5\' 4"'  (1.626 m).   Weight as of this encounter: 93 kg (205 lb).  Procedure:  Procedure(s) (LRB): RIGHT TOTAL KNEE ARTHROPLASTY (Right)   Consults: None  HPI: [name] is a 82 y.o. year old female with end stage OA of her right knee with progressively worsening pain and dysfunction. She has constant pain, with activity and at rest and significant functional deficits with difficulties even with ADLs. She has had extensive non-op management including analgesics, injections of cortisone and viscosupplements, and home exercise program, but remains in significant pain with significant dysfunction.Radiographs show bone on bone arthritis lateral and patellofemoral. She presents now for right Total Knee Arthroplasty.    Laboratory Data: Admission on 01/20/2018, Discharged on 01/22/2018  Component Date Value Ref Range Status  . ABO/RH(D) 01/13/2018    Final                 Value:B POS Performed at [facility], [place] [address] [place], [place] [address]   . WBC 01/21/2018 6.6  4.0 - 10.5 K/uL Final  . RBC 01/21/2018 3.96  3.87 - 5.11 MIL/uL Final  . Hemoglobin 01/21/2018 11.8* 12.0 - 15.0 g/dL Final  . HCT 01/21/2018 35.3* 36.0 - 46.0 % Final  . MCV 01/21/2018 89.1  78.0 - 100.0 fL Final  . MCH 01/21/2018 29.8  26.0 - 34.0 pg Final  . MCHC 01/21/2018 33.4  30.0 - 36.0 g/dL Final  . RDW 01/21/2018 13.6  11.5 - 15.5 % Final  . Platelets 01/21/2018 193  150 - 400 K/uL Final   Performed at [facility], [place] [address], [place], [place] [address]  . Sodium 01/21/2018 139  135 - 145 mmol/L Final  . Potassium 01/21/2018 3.2* 3.5 - 5.1 mmol/L Final  . Chloride 01/21/2018 102  98 - 111 mmol/L Final  . CO2 01/21/2018 29  22 - 32 mmol/L Final  . Glucose, Bld 01/21/2018 253* 70 - 99 mg/dL Final  . BUN 01/21/2018 12  8 - 23 mg/dL Final  . Creatinine, Ser 01/21/2018 0.81  0.44 - 1.00 mg/dL Final  . Calcium 01/21/2018 9.2  8.9 - 10.3 mg/dL Final  . GFR calc non Af Amer 01/21/2018 >60  >60 mL/min Final  . GFR calc Af  Amer 01/21/2018 >60  >60 mL/min Final   Comment: (NOTE) The eGFR has been calculated using the CKD EPI equation. This calculation has not been validated in all clinical situations. eGFR's persistently <60 mL/min signify possible Chronic Kidney Disease.   Georgiann Hahn gap 01/21/2018 8  5 - 15 Final   Performed at [facility], [place] [address], [place], [place] [address]  . WBC 01/22/2018 9.6  4.0 - 10.5 K/uL Final  . RBC 01/22/2018 3.75* 3.87 - 5.11 MIL/uL Final  . Hemoglobin 01/22/2018 11.3* 12.0 - 15.0 g/dL Final  . HCT 01/22/2018 34.0* 36.0 - 46.0 % Final  . MCV 01/22/2018 90.7  78.0 - 100.0 fL Final  . MCH 01/22/2018 30.1  26.0 - 34.0 pg Final  . MCHC 01/22/2018 33.2  30.0 - 36.0 g/dL Final  . RDW 01/22/2018 13.9  11.5 - 15.5 % Final  . Platelets 01/22/2018 176  150 - 400 K/uL Final    Performed at [facility], [place] [address], [place], [place] [address]  . Sodium 01/22/2018 142  135 - 145 mmol/L Final  . Potassium 01/22/2018 4.0  3.5 - 5.1 mmol/L Final   Comment: DELTA CHECK NOTED REPEATED TO VERIFY NO VISIBLE HEMOLYSIS   . Chloride 01/22/2018 102  98 - 111 mmol/L Final  . CO2 01/22/2018 31  22 - 32 mmol/L Final  . Glucose, Bld 01/22/2018 151* 70 - 99 mg/dL Final  . BUN 01/22/2018 18  8 - 23 mg/dL Final  . Creatinine, Ser 01/22/2018 0.76  0.44 - 1.00 mg/dL Final  . Calcium 01/22/2018 9.7  8.9 - 10.3 mg/dL Final  . GFR calc non Af Amer 01/22/2018 >60  >60 mL/min Final  . GFR calc Af Amer 01/22/2018 >60  >60 mL/min Final   Comment: (NOTE) The eGFR has been calculated using the CKD EPI equation. This calculation has not been validated in all clinical situations. eGFR's persistently <60 mL/min signify possible Chronic Kidney Disease.   Georgiann Hahn gap 01/22/2018 9  5 - 15 Final   Performed at [facility], [place] [address], [place], [place] [address]  Hospital Outpatient Visit on 01/13/2018  Component Date Value Ref Range Status  . aPTT 01/13/2018 27  24 - 36 seconds Final   Performed at [facility], [place] [address], [place], [place] [address]  . WBC 01/13/2018 6.4  4.0 - 10.5 K/uL Final  . RBC 01/13/2018 4.69  3.87 - 5.11 MIL/uL Final  . Hemoglobin 01/13/2018 13.9  12.0 - 15.0 g/dL Final  . HCT 01/13/2018 42.5  36.0 - 46.0 % Final  . MCV 01/13/2018 90.6  78.0 - 100.0 fL Final  . MCH 01/13/2018 29.6  26.0 - 34.0 pg Final  . MCHC 01/13/2018 32.7  30.0 - 36.0 g/dL Final  . RDW 01/13/2018 13.9  11.5 - 15.5 % Final  . Platelets 01/13/2018 220  150 - 400 K/uL Final   Performed at [facility] [place], [place] [address], [place], [place] [address]  . Sodium 01/13/2018 144  135 - 145 mmol/L Final  . Potassium 01/13/2018 3.4* 3.5 - 5.1 mmol/L Final  . Chloride 01/13/2018 104  98 - 111 mmol/L Final  . CO2  01/13/2018 30  22 - 32 mmol/L Final  . Glucose, Bld 01/13/2018 101* 70 - 99 mg/dL Final  . BUN 01/13/2018 17  8 - 23 mg/dL Final  . Creatinine, Ser 01/13/2018 0.93  0.44 - 1.00 mg/dL Final  . Calcium  01/13/2018 10.1  8.9 - 10.3 mg/dL Final  . Total Protein 01/13/2018 8.2* 6.5 - 8.1 g/dL Final  . Albumin 01/13/2018 4.4  3.5 - 5.0 g/dL Final  . AST 01/13/2018 26  15 - 41 U/L Final  . ALT 01/13/2018 26  0 - 44 U/L Final  . Alkaline Phosphatase 01/13/2018 54  38 - 126 U/L Final  . Total Bilirubin 01/13/2018 1.2  0.3 - 1.2 mg/dL Final  . GFR calc non Af Amer 01/13/2018 57* >60 mL/min Final  . GFR calc Af Amer 01/13/2018 >60  >60 mL/min Final   Comment: (NOTE) The eGFR has been calculated using the CKD EPI equation. This calculation has not been validated in all clinical situations. eGFR's persistently <60 mL/min signify possible Chronic Kidney Disease.   Georgiann Hahn gap 01/13/2018 10  5 - 15 Final   Performed at [facility], [place] [address] [place], [place] [address]  . Prothrombin Time 01/13/2018 14.5  11.4 - 15.2 seconds Final  . INR 01/13/2018 1.14   Final   Performed at [facility], [place] [address], [place], [place] [address]  . ABO/RH(D) 01/13/2018 B POS   Final  . Antibody Screen 01/13/2018 NEG   Final  . Sample Expiration 01/13/2018 01/23/2018   Final  . Extend sample reason 01/13/2018    Final                   Value:NO TRANSFUSIONS OR PREGNANCY IN THE PAST 3 MONTHS Performed at [facility], [place] [address], [place], [place] [address]   . Color, Urine 01/13/2018 YELLOW  YELLOW Final  . APPearance 01/13/2018 HAZY* CLEAR Final  . Specific Gravity, Urine 01/13/2018 1.021  1.005 - 1.030 Final  . pH 01/13/2018 6.0  5.0 - 8.0 Final  . Glucose, UA 01/13/2018 NEGATIVE  NEGATIVE mg/dL Final  . Hgb urine dipstick 01/13/2018 NEGATIVE  NEGATIVE Final  . Bilirubin Urine 01/13/2018 NEGATIVE  NEGATIVE Final  . Ketones, ur 01/13/2018  NEGATIVE  NEGATIVE mg/dL Final  . Protein, ur 01/13/2018 NEGATIVE  NEGATIVE mg/dL Final  . Nitrite 01/13/2018 NEGATIVE  NEGATIVE Final  . Leukocytes, UA 01/13/2018 LARGE* NEGATIVE Final  . RBC / HPF 01/13/2018 0-5  0 - 5 RBC/hpf Final  . WBC, UA 01/13/2018 >50* 0 - 5 WBC/hpf Final  . Bacteria, UA 01/13/2018 RARE* NONE SEEN Final  . Squamous Epithelial / LPF 01/13/2018 0-5  0 - 5 Final  . Mucus 01/13/2018 PRESENT   Final  . Non Squamous Epithelial 01/13/2018 0-5* NONE SEEN Final   Performed at [facility], [place] [address] [place], [place] [address]  . MRSA, PCR 01/13/2018 NEGATIVE  NEGATIVE Final  . Staphylococcus aureus 01/13/2018 NEGATIVE  NEGATIVE Final   Comment: (NOTE) The Xpert SA Assay (FDA approved for NASAL specimens in patients 52 years of age and older), is one component of a comprehensive surveillance program. It is not intended to diagnose infection nor to guide or monitor treatment. Performed at [facility], [place] [address] [place], [place] [address]      X-Rays:No results found.  EKG: Orders placed or performed in visit on 12/04/17  . EKG 12-Lead     Hospital Course: SHERLONDA FLATER is a 82 y.o. who was admitted to [facility]. They were brought to the operating room on 01/20/2018 and underwent Procedure(s): RIGHT TOTAL KNEE ARTHROPLASTY.  Patient tolerated the procedure well and was later transferred to the recovery  room and then to the orthopaedic floor for postoperative care.  They were given PO and IV analgesics for pain control following their surgery.  They were given 24 hours of postoperative antibiotics of  Anti-infectives (From admission, onward)   Start     Dose/Rate Route Frequency Ordered Stop   01/20/18 2000  ceFAZolin (ANCEF) IVPB 2g/100 mL premix     2 g 200 mL/hr over 30 Minutes Intravenous Every 6 hours 01/20/18 1647 01/21/18 0204   01/20/18 1045  ceFAZolin (ANCEF) IVPB 2g/100 mL premix     2 g 200  mL/hr over 30 Minutes Intravenous On call to O.R. 01/20/18 1036 01/20/18 1420     and started on DVT prophylaxis in the form of Aspirin.   PT and OT were ordered for total joint protocol.  Discharge planning consulted to help with postop disposition and equipment needs.  Patient had a good night on the evening of surgery. She started to get up OOB with therapy on POD #1. Hemovac drain was pulled without difficulty. Pt's hemoglobin was noted to be low at 3.2, 2 doses of 40 mEq KCl were ordered. She continued to work with therapy into POD #2. Patient was seen during rounds that morning and was ready to go home pending progress with therapy. Dressing was changed and the incision was clean, dry, and intact with no drainage. Potassium was improved at 4.0. She completed two additional sessions of physical therapy and was meeting her goals. She was discharged to home later that day in stable condition.   Diet: Cardiac diet Activity:WBAT Follow-up:in 2 weeks with Dr. Wynelle Link Disposition - Home with virtual physical therapy Discharged Condition: stable   Discharge Instructions    Call MD / Call 911   Complete by:  As directed    If you experience chest pain or shortness of breath, CALL 911 and be transported to the hospital emergency room.  If you develope a fever above 101 F, pus (white drainage) or increased drainage or redness at the wound, or calf pain, call your surgeon's office.   Change dressing   Complete by:  As directed    Change the dressing daily with sterile 4 x 4 inch gauze dressing and apply TED hose.   Constipation Prevention   Complete by:  As directed    Drink plenty of fluids.  Prune juice may be helpful.  You may use a stool softener, such as Colace (over the counter) 100 mg twice a day.  Use MiraLax (over the counter) for constipation as needed.   Diet - low sodium heart healthy   Complete by:  As directed    Discharge instructions   Complete by:  As directed    Dr. Gaynelle Arabian Total Joint Specialist [facility] [address], [place],  [address] [phone]  TOTAL KNEE REPLACEMENT POSTOPERATIVE DIRECTIONS  Knee Rehabilitation, Guidelines Following Surgery  Results after knee surgery are often greatly improved when you follow the exercise, range of motion and muscle strengthening exercises prescribed by your doctor. Safety measures are also important to protect the knee from further injury. Any time any of these exercises cause you to have increased pain or swelling in your knee joint, decrease the amount until you are comfortable again and slowly increase them. If you have problems or questions, call your caregiver or physical therapist for advice.   HOME CARE INSTRUCTIONS  Remove items at home which could result in a fall. This includes throw rugs or furniture  in walking pathways.  ICE to the affected knee every three hours for 30 minutes at a time and then as needed for pain and swelling.  Continue to use ice on the knee for pain and swelling from surgery. You may notice swelling that will progress down to the foot and ankle.  This is normal after surgery.  Elevate the leg when you are not up walking on it.   Continue to use the breathing machine which will help keep your temperature down.  It is common for your temperature to cycle up and down following surgery, especially at night when you are not up moving around and exerting yourself.  The breathing machine keeps your lungs expanded and your temperature down. Do not place pillow under knee, focus on keeping the knee straight while resting   DIET You may resume your previous home diet once your are discharged from the hospital.  DRESSING / WOUND CARE / SHOWERING You may shower 3 days after surgery, but keep the wounds dry during showering.  You may use an occlusive plastic wrap (Press'n Seal for example), NO SOAKING/SUBMERGING IN THE BATHTUB.  If the bandage gets wet, change with a  clean dry gauze.  If the incision gets wet, pat the wound dry with a clean towel. You may start showering once you are discharged home but do not submerge the incision under water. Just pat the incision dry and apply a dry gauze dressing on daily. Change the surgical dressing daily and reapply a dry dressing each time.  ACTIVITY Walk with your walker as instructed. Use walker as long as suggested by your caregivers. Avoid periods of inactivity such as sitting longer than an hour when not asleep. This helps prevent blood clots.  You may resume a sexual relationship in one month or when given the OK by your doctor.  You may return to work once you are cleared by your doctor.  Do not drive a car for 6 weeks or until released by you surgeon.  Do not drive while taking narcotics.  WEIGHT BEARING Weight bearing as tolerated with assist device (walker, cane, etc) as directed, use it as long as suggested by your surgeon or therapist, typically at least 4-6 weeks.  POSTOPERATIVE CONSTIPATION PROTOCOL Constipation - defined medically as fewer than three stools per week and severe constipation as less than one stool per week.  One of the most common issues patients have following surgery is constipation.  Even if you have a regular bowel pattern at home, your normal regimen is likely to be disrupted due to multiple reasons following surgery.  Combination of anesthesia, postoperative narcotics, change in appetite and fluid intake all can affect your bowels.  In order to avoid complications following surgery, here are some recommendations in order to help you during your recovery period.  Colace (docusate) - Pick up an over-the-counter form of Colace or another stool softener and take twice a day as long as you are requiring postoperative pain medications.  Take with a full glass of water daily.  If you experience loose stools or diarrhea, hold the colace until you stool forms back up.  If your symptoms do  not get better within 1 week or if they get worse, check with your doctor.  Dulcolax (bisacodyl) - Pick up over-the-counter and take as directed by the product packaging as needed to assist with the movement of your bowels.  Take with a full glass of water.  Use this product as needed if  not relieved by Colace only.   MiraLax (polyethylene glycol) - Pick up over-the-counter to have on hand.  MiraLax is a solution that will increase the amount of water in your bowels to assist with bowel movements.  Take as directed and can mix with a glass of water, juice, soda, coffee, or tea.  Take if you go more than two days without a movement. Do not use MiraLax more than once per day. Call your doctor if you are still constipated or irregular after using this medication for 7 days in a row.  If you continue to have problems with postoperative constipation, please contact the office for further assistance and recommendations.  If you experience "the worst abdominal pain ever" or develop nausea or vomiting, please contact the office immediatly for further recommendations for treatment.  ITCHING  If you experience itching with your medications, try taking only a single pain pill, or even half a pain pill at a time.  You can also use Benadryl over the counter for itching or also to help with sleep.   TED HOSE STOCKINGS Wear the elastic stockings on both legs for three weeks following surgery during the day but you may remove then at night for sleeping.  MEDICATIONS See your medication summary on the "After Visit Summary" that the nursing staff will review with you prior to discharge.  You may have some home medications which will be placed on hold until you complete the course of blood thinner medication.  It is important for you to complete the blood thinner medication as prescribed by your surgeon.  Continue your approved medications as instructed at time of discharge.  PRECAUTIONS If you experience chest pain  or shortness of breath - call 911 immediately for transfer to the hospital emergency department.  If you develop a fever greater that 101 F, purulent drainage from wound, increased redness or drainage from wound, foul odor from the wound/dressing, or calf pain - CONTACT YOUR SURGEON.                                                   FOLLOW-UP APPOINTMENTS Make sure you keep all of your appointments after your operation with your surgeon and caregivers. You should call the office at the above phone number and make an appointment for approximately two weeks after the date of your surgery or on the date instructed by your surgeon outlined in the "After Visit Summary".   RANGE OF MOTION AND STRENGTHENING EXERCISES  Rehabilitation of the knee is important following a knee injury or an operation. After just a few days of immobilization, the muscles of the thigh which control the knee become weakened and shrink (atrophy). Knee exercises are designed to build up the tone and strength of the thigh muscles and to improve knee motion. Often times heat used for twenty to thirty minutes before working out will loosen up your tissues and help with improving the range of motion but do not use heat for the first two weeks following surgery. These exercises can be done on a training (exercise) mat, on the floor, on a table or on a bed. Use what ever works the best and is most comfortable for you Knee exercises include:  Leg Lifts - While your knee is still immobilized in a splint or cast, you can do straight leg raises.  Lift the leg to 60 degrees, hold for 3 sec, and slowly lower the leg. Repeat 10-20 times 2-3 times daily. Perform this exercise against resistance later as your knee gets better.  Quad and Hamstring Sets - Tighten up the muscle on the front of the thigh (Quad) and hold for 5-10 sec. Repeat this 10-20 times hourly. Hamstring sets are done by pushing the foot backward against an object and holding for 5-10  sec. Repeat as with quad sets.  Leg Slides: Lying on your back, slowly slide your foot toward your buttocks, bending your knee up off the floor (only go as far as is comfortable). Then slowly slide your foot back down until your leg is flat on the floor again. Angel Wings: Lying on your back spread your legs to the side as far apart as you can without causing discomfort.  A rehabilitation program following serious knee injuries can speed recovery and prevent re-injury in the future due to weakened muscles. Contact your doctor or a physical therapist for more information on knee rehabilitation.   IF YOU ARE TRANSFERRED TO A SKILLED REHAB FACILITY If the patient is transferred to a skilled rehab facility following release from the hospital, a list of the current medications will be sent to the facility for the patient to continue.  When discharged from the skilled rehab facility, please have the facility set up the patient's [place] prior to being released. Also, the skilled facility will be responsible for providing the patient with their medications at time of release from the facility to include their pain medication, the muscle relaxants, and their blood thinner medication. If the patient is still at the rehab facility at time of the two week follow up appointment, the skilled rehab facility will also need to assist the patient in arranging follow up appointment in our office and any transportation needs.  MAKE SURE YOU:  Understand these instructions.  Get help right away if you are not doing well or get worse.    Pick up stool softner and laxative for home use following surgery while on pain medications. Do not submerge incision under water. Please use good hand washing techniques while changing dressing each day. May shower starting three days after surgery. Please use a clean towel to pat the incision dry following showers. Continue to use ice for pain and swelling after  surgery. Do not use any lotions or creams on the incision until instructed by your surgeon.   Do not put a pillow under the knee. Place it under the heel.   Complete by:  As directed    Driving restrictions   Complete by:  As directed    No driving for two weeks   TED hose   Complete by:  As directed    Use stockings (TED hose) for three weeks on both leg(s).  You may remove them at night for sleeping.   Weight bearing as tolerated   Complete by:  As directed      Allergies as of 01/22/2018      Reactions   Lipitor [atorvastatin]    Stomach pains, heart attack sx's      Medication List    TAKE these medications   acetaminophen 500 MG tablet Commonly known as:  TYLENOL Take 1,000 mg by mouth every 6 (six) hours as needed for mild pain.   amLODipine 10 MG tablet Commonly known as:  NORVASC Take 10 mg by mouth daily.   aspirin 325 MG  EC tablet Take 1 tablet (325 mg total) by mouth 2 (two) times daily for 19 days. Take one tablet (325 mg) Aspirin two times a day for three weeks following surgery. Then take one baby Aspirin (81 mg) once a day for three weeks. Then discontinue aspirin.   chlorthalidone 25 MG tablet Commonly known as:  HYGROTON Take 0.5 tablets by mouth daily.   diphenhydrAMINE 25 MG tablet Commonly known as:  BENADRYL Take 25 mg by mouth daily.   irbesartan 300 MG tablet Commonly known as:  AVAPRO Take 1 tablet by mouth daily.   methocarbamol 500 MG tablet Commonly known as:  ROBAXIN Take 1 tablet (500 mg total) by mouth every 6 (six) hours as needed for muscle spasms.   multivitamin tablet Take 1 tablet by mouth daily.   oxyCODONE 5 MG immediate release tablet Commonly known as:  Oxy IR/ROXICODONE Take 1-2 tablets (5-10 mg total) by mouth every 4 (four) hours as needed for moderate pain (pain score 4-6).   simvastatin 40 MG tablet Commonly known as:  ZOCOR Take 40 mg by mouth daily.   traMADol 50 MG tablet Commonly known as:  ULTRAM Take 1-2  tablets (50-100 mg total) by mouth every 6 (six) hours as needed for moderate pain (if oxycodone not effective).            Discharge Care Instructions  (From admission, onward)        Start     Ordered   01/22/18 0000  Weight bearing as tolerated     01/22/18 0709   01/22/18 0000  Change dressing    Comments:  Change the dressing daily with sterile 4 x 4 inch gauze dressing and apply TED hose.   01/22/18 0709     Follow-up Information    Gaynelle Arabian, MD. Schedule an appointment as soon as possible for a visit on 02/04/2018.   Specialty:  Orthopedic Surgery Contact information: [address] [place] [address] [place] [address] [phone]        [facility] - Dme Follow up.   Why:  walker and [employer] Contact information: [address] [address] [phone]           Signed: Theresa Duty, PA-C Orthopaedic Surgery 01/23/2018, 1:45 PM

## 2018-02-04 DIAGNOSIS — Z96651 Presence of right artificial knee joint: Secondary | ICD-10-CM | POA: Insufficient documentation

## 2018-02-25 DIAGNOSIS — Z96651 Presence of right artificial knee joint: Secondary | ICD-10-CM | POA: Diagnosis not present

## 2018-02-25 DIAGNOSIS — M1711 Unilateral primary osteoarthritis, right knee: Secondary | ICD-10-CM | POA: Diagnosis not present

## 2018-02-25 DIAGNOSIS — Z471 Aftercare following joint replacement surgery: Secondary | ICD-10-CM | POA: Diagnosis not present

## 2018-02-26 ENCOUNTER — Other Ambulatory Visit: Payer: Self-pay | Admitting: Internal Medicine

## 2018-02-26 DIAGNOSIS — Z1231 Encounter for screening mammogram for malignant neoplasm of breast: Secondary | ICD-10-CM

## 2018-03-20 DIAGNOSIS — Z23 Encounter for immunization: Secondary | ICD-10-CM | POA: Diagnosis not present

## 2018-03-25 ENCOUNTER — Ambulatory Visit
Admission: RE | Admit: 2018-03-25 | Discharge: 2018-03-25 | Disposition: A | Discharge status: Home / Self Care | Payer: Medicare Other | Source: Ambulatory Visit | Attending: Internal Medicine | Admitting: Internal Medicine

## 2018-03-25 DIAGNOSIS — Z1231 Encounter for screening mammogram for malignant neoplasm of breast: Secondary | ICD-10-CM | POA: Diagnosis not present

## 2018-03-25 DIAGNOSIS — N201 Calculus of ureter: Secondary | ICD-10-CM | POA: Diagnosis not present

## 2018-04-22 DIAGNOSIS — R82998 Other abnormal findings in urine: Secondary | ICD-10-CM | POA: Diagnosis not present

## 2018-04-22 DIAGNOSIS — I10 Essential (primary) hypertension: Secondary | ICD-10-CM | POA: Diagnosis not present

## 2018-04-22 DIAGNOSIS — E1165 Type 2 diabetes mellitus with hyperglycemia: Secondary | ICD-10-CM | POA: Diagnosis not present

## 2018-04-22 DIAGNOSIS — E785 Hyperlipidemia, unspecified: Secondary | ICD-10-CM | POA: Diagnosis not present

## 2018-04-22 DIAGNOSIS — E559 Vitamin D deficiency, unspecified: Secondary | ICD-10-CM | POA: Diagnosis not present

## 2018-04-23 ENCOUNTER — Encounter: Payer: Self-pay | Admitting: Cardiovascular Disease

## 2018-04-23 ENCOUNTER — Ambulatory Visit (INDEPENDENT_AMBULATORY_CARE_PROVIDER_SITE_OTHER): Payer: Medicare Other | Admitting: Cardiovascular Disease

## 2018-04-23 DIAGNOSIS — I2510 Atherosclerotic heart disease of native coronary artery without angina pectoris: Secondary | ICD-10-CM | POA: Diagnosis not present

## 2018-04-23 DIAGNOSIS — E7800 Pure hypercholesterolemia, unspecified: Secondary | ICD-10-CM

## 2018-04-23 DIAGNOSIS — I10 Essential (primary) hypertension: Secondary | ICD-10-CM | POA: Diagnosis not present

## 2018-04-23 NOTE — Assessment & Plan Note (Signed)
History of minimal coronary artery disease by cardiac catheterization performed by myself 10/16/2005 without symptoms of chest pain or shortness of breath.

## 2018-04-23 NOTE — Assessment & Plan Note (Signed)
Essential hypertension with blood pressure measured today 132/76.  She is on amlodipine and Avapro as well as chlorthalidone.  Continue current meds at current dosing.

## 2018-04-23 NOTE — Progress Notes (Signed)
04/23/2018 Kathryn Kent   1935-11-05  [id number]  Primary Physician Crist Infante, MD Primary Cardiologist: Lorretta Harp MD Lupe Carney, [place]  HPI:  Kathryn Kent is a 82 y.o. moderately overweight, widowed [place] American female with no children who is a patient of Dr. Crist Infante. I last saw her  04/11/2016. She has a history of noncritical CAD by cath October 16, 2005, with scattered 30% to 40% stenoses in the RCA and LAD. She denied chest pain or shortness of breath, but does get occasional palpitations. Her other problems include hypertension, hyperlipidemia and obstructive sleep apnea on CPAP which she benefits from. Dr. Joylene Draft follows her lipid profile closely.since I saw her last she's been completely asymptomatic She was seen by Daune Perch, NP 12/04/2017 for preoperative clearance before left total knee replacement which was performed successfully by Dr. Wynelle Link 01/20/2018.  She has completely rehabilitated.  Current Meds  Medication Sig  . acetaminophen (TYLENOL) 500 MG tablet Take 1,000 mg by mouth every 6 (six) hours as needed for mild pain.  Marland Kitchen amLODipine (NORVASC) 10 MG tablet Take 10 mg by mouth daily.  . chlorthalidone (HYGROTON) 25 MG tablet Take 0.5 tablets by mouth daily.  . diphenhydrAMINE (BENADRYL) 25 MG tablet Take 25 mg by mouth daily.  . irbesartan (AVAPRO) 300 MG tablet Take 1 tablet by mouth daily.  . Multiple Vitamin (MULTIVITAMIN) tablet Take 1 tablet by mouth daily.  . simvastatin (ZOCOR) 40 MG tablet Take 40 mg by mouth daily.      Allergies  Allergen Reactions  . Lipitor [Atorvastatin]     Stomach pains, heart attack sx's    Social History   Socioeconomic History  . Marital status: Widowed    Spouse name: Not on file  . Number of children: 0  . Years of education: Not on file  . Highest education level: Not on file  Occupational History  . Occupation: retired  Scientific laboratory technician  . Financial resource strain: Not on file  . Food  insecurity:    Worry: Not on file    Inability: Not on file  . Transportation needs:    Medical: Not on file    Non-medical: Not on file  Tobacco Use  . Smoking status: Never Smoker  . Smokeless tobacco: Never Used  Substance and Sexual Activity  . Alcohol use: No  . Drug use: No  . Sexual activity: Not Currently    Birth control/protection: Post-menopausal  Lifestyle  . Physical activity:    Days per week: Not on file    Minutes per session: Not on file  . Stress: Not on file  Relationships  . Social connections:    Talks on phone: Not on file    Gets together: Not on file    Attends religious service: Not on file    Active member of club or organization: Not on file    Attends meetings of clubs or organizations: Not on file    Relationship status: Not on file  . Intimate partner violence:    Fear of current or ex partner: Not on file    Emotionally abused: Not on file    Physically abused: Not on file    Forced sexual activity: Not on file  Other Topics Concern  . Not on file  Social History Narrative  . Not on file     Review of Systems: General: negative for chills, fever, night sweats or weight changes.  Cardiovascular: negative for chest  pain, dyspnea on exertion, edema, orthopnea, palpitations, paroxysmal nocturnal dyspnea or shortness of breath Dermatological: negative for rash Respiratory: negative for cough or wheezing Urologic: negative for hematuria Abdominal: negative for nausea, vomiting, diarrhea, bright red blood per rectum, melena, or hematemesis Neurologic: negative for visual changes, syncope, or dizziness All other systems reviewed and are otherwise negative except as noted above.    Blood pressure 132/76, pulse 92, height 5\' 4"  (1.626 m), weight 204 lb 3.2 oz (92.6 kg), SpO2 97 %.  General appearance: alert and no distress Neck: no adenopathy, no carotid bruit, no JVD, supple, symmetrical, trachea midline and thyroid not enlarged, symmetric, no  tenderness/mass/nodules Lungs: clear to auscultation bilaterally Heart: regular rate and rhythm, S1, S2 normal, no murmur, click, rub or gallop Extremities: extremities normal, atraumatic, no cyanosis or edema Pulses: 2+ and symmetric Skin: Skin color, texture, turgor normal. No rashes or lesions Neurologic: Alert and oriented X 3, normal strength and tone. Normal symmetric reflexes. Normal coordination and gait  EKG not performed today  ASSESSMENT AND PLAN:   Essential hypertension Essential hypertension with blood pressure measured today 132/76.  She is on amlodipine and Avapro as well as chlorthalidone.  Continue current meds at current dosing.  Hyperlipidemia History of hyperlipidemia on statin therapy with recent lipid profile performed 04/22/2018 revealing total cholesterol 185, LDL 107 and HDL of 60.  Coronary artery disease History of minimal coronary artery disease by cardiac catheterization performed by myself 10/16/2005 without symptoms of chest pain or shortness of breath.      Lorretta Harp MD FACP,FACC,FAHA, [facility] 04/23/2018 10:02 AM

## 2018-04-23 NOTE — Assessment & Plan Note (Signed)
History of hyperlipidemia on statin therapy with recent lipid profile performed 04/22/2018 revealing total cholesterol 185, LDL 107 and HDL of 60.

## 2018-04-23 NOTE — Patient Instructions (Signed)

## 2018-04-29 DIAGNOSIS — E119 Type 2 diabetes mellitus without complications: Secondary | ICD-10-CM | POA: Diagnosis not present

## 2018-04-29 DIAGNOSIS — I10 Essential (primary) hypertension: Secondary | ICD-10-CM | POA: Diagnosis not present

## 2018-04-29 DIAGNOSIS — R000 Tachycardia, unspecified: Secondary | ICD-10-CM | POA: Diagnosis not present

## 2018-04-29 DIAGNOSIS — Z0000 Encounter for general adult medical examination without abnormal findings: Secondary | ICD-10-CM | POA: Diagnosis not present

## 2018-04-29 DIAGNOSIS — Z23 Encounter for immunization: Secondary | ICD-10-CM | POA: Diagnosis not present

## 2018-04-29 DIAGNOSIS — E668 Other obesity: Secondary | ICD-10-CM | POA: Diagnosis not present

## 2018-04-29 DIAGNOSIS — Z1389 Encounter for screening for other disorder: Secondary | ICD-10-CM | POA: Diagnosis not present

## 2018-04-29 DIAGNOSIS — E7849 Other hyperlipidemia: Secondary | ICD-10-CM | POA: Diagnosis not present

## 2018-04-29 DIAGNOSIS — Z6832 Body mass index (BMI) 32.0-32.9, adult: Secondary | ICD-10-CM | POA: Diagnosis not present

## 2018-04-29 DIAGNOSIS — H6123 Impacted cerumen, bilateral: Secondary | ICD-10-CM | POA: Diagnosis not present

## 2018-04-29 DIAGNOSIS — H268 Other specified cataract: Secondary | ICD-10-CM | POA: Diagnosis not present

## 2018-04-29 DIAGNOSIS — N182 Chronic kidney disease, stage 2 (mild): Secondary | ICD-10-CM | POA: Diagnosis not present

## 2018-04-29 DIAGNOSIS — I340 Nonrheumatic mitral (valve) insufficiency: Secondary | ICD-10-CM | POA: Diagnosis not present

## 2018-05-09 DIAGNOSIS — Z1212 Encounter for screening for malignant neoplasm of rectum: Secondary | ICD-10-CM | POA: Diagnosis not present

## 2018-06-12 DIAGNOSIS — Z6834 Body mass index (BMI) 34.0-34.9, adult: Secondary | ICD-10-CM | POA: Diagnosis not present

## 2018-06-12 DIAGNOSIS — J40 Bronchitis, not specified as acute or chronic: Secondary | ICD-10-CM | POA: Diagnosis not present

## 2018-11-03 ENCOUNTER — Ambulatory Visit: Payer: Medicare Other | Admitting: Obstetrics & Gynecology

## 2018-12-01 ENCOUNTER — Telehealth: Payer: Self-pay | Admitting: Obstetrics & Gynecology

## 2018-12-01 NOTE — Telephone Encounter (Signed)
Patient complaining of brown/pink spotting 3-4 times in the last week. This is mainly with wiping after urinating, but patient states it is definitely coming from vaginal area. Made appointment with Dr.Miller 12/02/18 12:45pm.

## 2018-12-01 NOTE — Telephone Encounter (Signed)
Patient stated that she saw blood when wiping after urinating. She stated that this first began on Wednesday and has happened multiple times since then. Patient stated that she is not experiencing any pain.

## 2018-12-02 ENCOUNTER — Ambulatory Visit (INDEPENDENT_AMBULATORY_CARE_PROVIDER_SITE_OTHER): Payer: Medicare Other

## 2018-12-02 ENCOUNTER — Encounter: Payer: Self-pay | Admitting: Obstetrics & Gynecology

## 2018-12-02 ENCOUNTER — Other Ambulatory Visit: Payer: Self-pay

## 2018-12-02 ENCOUNTER — Ambulatory Visit (INDEPENDENT_AMBULATORY_CARE_PROVIDER_SITE_OTHER): Payer: Medicare Other | Admitting: Obstetrics & Gynecology

## 2018-12-02 VITALS — BP 136/80 | HR 84 | Temp 96.7°F | Ht 64.0 in | Wt 219.0 lb

## 2018-12-02 DIAGNOSIS — D251 Intramural leiomyoma of uterus: Secondary | ICD-10-CM

## 2018-12-02 DIAGNOSIS — N950 Postmenopausal bleeding: Secondary | ICD-10-CM

## 2018-12-02 DIAGNOSIS — R319 Hematuria, unspecified: Secondary | ICD-10-CM

## 2018-12-02 LAB — POCT URINALYSIS DIPSTICK
Bilirubin, UA: NEGATIVE
Blood, UA: NEGATIVE
Glucose, UA: NEGATIVE
Ketones, UA: NEGATIVE
Nitrite, UA: NEGATIVE
Protein, UA: NEGATIVE
Urobilinogen, UA: 0.2 E.U./dL
pH, UA: 8 (ref 5.0–8.0)

## 2018-12-02 NOTE — Progress Notes (Signed)
GYNECOLOGY  VISIT  CC:   Blood when wiping on and off x 1 week   HPI: 83 y.o. G5P0010 Widowed Black or [place] American female here for vaginal spotting.  Pt reports she had bright red bleeding on Wednesday of last week and then had dark spotting with wiping on Saturday.  Denies cramping.  Is sure this is vaginal bleeding.    Denies new issues with constipation.  Denies urinary symptoms.  She has a known hx of fibroids and did have bleeding in 2017.  Endometrial biopsy 09/05/15 showed scant atrophic appearing endometrium.  GYNECOLOGIC HISTORY: No LMP recorded. Patient is postmenopausal. Contraception: PMP Menopausal hormone therapy: none  Patient Active Problem List   Diagnosis Date Noted  . OA (osteoarthritis) of knee 01/20/2018  . Intramural leiomyoma of uterus 09/12/2014  . Sebaceous cyst of labia 09/12/2014  . Essential hypertension 05/28/2013  . Hyperlipidemia 05/28/2013  . Coronary artery disease 05/28/2013  . Heart murmur     Past Medical History:  Diagnosis Date  . Alcoholism ([place])   . Anemia   . Atrial fibrillation ([place])   . Chicken pox   . Colon polyps   . Coronary artery disease    status post cardiac catheterization performed 10/16/05 revealing noncritical CAD  . Cyst of left kidney    small  . Diverticulosis   . Fibroid tumor   . Heart murmur   . History of kidney stones   . Hyperlipidemia   . Hypertension   . IFG (impaired fasting glucose)   . Liver cyst    small  . Nephrolithiasis   . Obesity   . OSA (obstructive sleep apnea)    CPAP nightly  . Osteoarthritis   . Periumbilical hernia    Moderate  . Shingles   . Vulvar cyst     Past Surgical History:  Procedure Laterality Date  . APPENDECTOMY    . CARDIAC CATHETERIZATION  09/2005   scattered 30-40% stenosis RCA and LAD  . COLONOSCOPY  06/24/2017  . cyst removed     abdomen  . CYSTOSCOPY W/ URETERAL STENT PLACEMENT Right 01/03/2017   Procedure: CYSTOSCOPY WITH RETROGRADE PYELOGRAM/  URETEROSCOPY/ STONE BASKETRY/URETERAL STENT PLACEMENT;  Surgeon: Cleon Gustin, MD;  Location[facility];  Service: Urology;  Laterality: Right;  . great toe surgery Bilateral 1987  . HOLMIUM LASER APPLICATION Right 4/43/1540   Procedure: HOLMIUM LASER APPLICATION;  Surgeon: Cleon Gustin, MD;  Location: [facility];  Service: Urology;  Laterality: Right;  . TOTAL KNEE ARTHROPLASTY Right 01/20/2018   Procedure: RIGHT TOTAL KNEE ARTHROPLASTY;  Surgeon: Gaynelle Arabian, MD;  Location: [facility];  Service: Orthopedics;  Laterality: Right;  with block    MEDS:   Current Outpatient Medications on File Prior to Visit  Medication Sig Dispense Refill  . acetaminophen (TYLENOL) 500 MG tablet Take 1,000 mg by mouth every 6 (six) hours as needed for mild pain.    Marland Kitchen amLODipine (NORVASC) 10 MG tablet Take 10 mg by mouth daily.    . chlorthalidone (HYGROTON) 25 MG tablet Take 0.5 tablets by mouth daily.    . irbesartan (AVAPRO) 300 MG tablet Take 1 tablet by mouth daily.  1  . Multiple Vitamin (MULTIVITAMIN) tablet Take 1 tablet by mouth daily.    . simvastatin (ZOCOR) 40 MG tablet Take 40 mg by mouth daily.      No current facility-administered medications on file prior to visit.     ALLERGIES: Lipitor [atorvastatin]  Family  History  Problem Relation Age of Onset  . Prostate cancer Father        ?  Marland Kitchen Alcohol abuse Mother   . Aneurysm Brother        heart  . Stroke Sister   . Breast cancer Sister 51  . Lung cancer Brother   . Colon cancer Neg Hx     SH:  Widowed, non smoker  Review of Systems  Genitourinary: Positive for vaginal bleeding.  All other systems reviewed and are negative.   PHYSICAL EXAMINATION:    BP 136/80   Pulse 84   Temp (!) 96.7 F (35.9 C) (Temporal)   Ht 5\' 4"  (1.626 m)   Wt 219 lb (99.3 kg)   BMI 37.59 kg/m     General appearance: alert, cooperative and appears stated age Lymph:  no inguinal LAD noted  Pelvic:  External genitalia:  no lesions              Urethra:  normal appearing urethra with no masses, tenderness or lesions              Bartholins and Skenes: normal                 Vagina: normal appearing vagina with normal color and discharge, no lesions              Cervix: no lesions              Bimanual Exam:  Uterus:  normal size, contour, position, consistency, mobility, non-tender and enlarged, 8-10 weeks, nodular c/w fibroids weeks size              Adnexa: no mass, fullness, tenderness   Ultrasound: Uterus:  10.8 x 7.3 x 8.0cm with multiple calcified fibroids Endometrium distorted by fibroids Neither ovary clearly identified  Endometrial biopsy recommended.  Discussed with patient.  Verbal and written consent obtained.   Procedure:  Speculum placed.  Cervix visualized and cleansed with betadine prep.  A single toothed tenaculum was applied to the anterior lip of the cervix.  Endometrial pipelle was advanced through the cervix into the endometrial cavity without difficulty.  Pipelle passed to 7cm.  Suction applied and pipelle removed with scant tissue.  This was performed 3 times without significant tissue noted.  Tenaculum removed.  No bleeding noted.  Patient tolerated procedure well.   Chaperone was present for exam.  Assessment: PMP bleeding Uterine fibroids  Plan: Endometrial biopsy pending.  Results will be called to pt.  If bleeding does not stop, will need to consider hysteroscopy for additional evaluation.     ~15 minutes spent with patient >50% of time was in face to face discussion of above.

## 2018-12-02 NOTE — Progress Notes (Signed)
Ultrasound documented in office note.

## 2018-12-03 LAB — URINALYSIS, MICROSCOPIC ONLY: Casts: NONE SEEN /lpf

## 2018-12-03 LAB — URINE CULTURE

## 2018-12-08 ENCOUNTER — Telehealth: Payer: Self-pay | Admitting: *Deleted

## 2018-12-08 NOTE — Telephone Encounter (Signed)
Call to patient to review results. Unable to leave message, voice mail is not set up.

## 2018-12-08 NOTE — Telephone Encounter (Signed)
-----   Message from Megan Salon, MD sent at 12/08/2018 12:23 PM EDT ----- Please let pt know her urine culture and micro were negative/normal.  Her endometrial biopsy did not show any abnormal cells however there wasn't much tissue present.  She was having red bleeding from her cervix so she does need to have additional evaluation.  We attempted ultrasound but due to the number of fibroids she has, the endometrium is very hard to evaluate.  She needs tohave a hysteroscopy with resection of a polyp if present and D&C.    CC:  Lowell Bouton, CMA

## 2018-12-09 NOTE — Telephone Encounter (Signed)
Call from patient. Advised of results of urine culture and endometrial biopsy. Advised of recommendation for surgical procedure.  Patient agreeable to proceed. Will schedule and call her back with confirmation.

## 2018-12-10 NOTE — Telephone Encounter (Signed)
Patient returned call

## 2018-12-10 NOTE — Telephone Encounter (Signed)
Return call to patient. She saw our number on phone. I have not called her but provided update regarding surgery planning. Will call her back when completed.

## 2018-12-17 NOTE — Telephone Encounter (Signed)
Patient is calling back to check on status of surgery.

## 2018-12-17 NOTE — Telephone Encounter (Signed)
Call to patient. Advised surgery is scheduled for 12-30-18 at 0730 at [facility], currently still pending review by anesthesia.  Need to confirm case is accepted at out-patient setting.  Surgery instruction sheet reviewed as well as Covid 19 guidelines. Will mail all copies to patient.

## 2018-12-17 NOTE — Telephone Encounter (Signed)
Patient is calling to check the status of surgery.

## 2018-12-17 NOTE — Progress Notes (Addendum)
Received call from sally, or scheduler for dr Sabra Heck, via phone.  Inquiring to review pt's chart and if ok to be done at [facility].  Spoke w/ sally , I reviewed chart and pt ok to be done at [facility] as long as she does not have any current cardiac symtoms.

## 2018-12-18 ENCOUNTER — Other Ambulatory Visit: Payer: Self-pay | Admitting: Obstetrics & Gynecology

## 2018-12-26 ENCOUNTER — Other Ambulatory Visit (HOSPITAL_COMMUNITY)
Admission: RE | Admit: 2018-12-26 | Discharge: 2018-12-26 | Disposition: A | Discharge status: Home / Self Care | Payer: Medicare Other | Source: Ambulatory Visit | Attending: Obstetrics & Gynecology | Admitting: Obstetrics & Gynecology

## 2018-12-26 DIAGNOSIS — Z01812 Encounter for preprocedural laboratory examination: Secondary | ICD-10-CM | POA: Diagnosis present

## 2018-12-26 DIAGNOSIS — Z1159 Encounter for screening for other viral diseases: Secondary | ICD-10-CM | POA: Insufficient documentation

## 2018-12-26 LAB — SARS CORONAVIRUS 2 (TAT 6-24 HRS): SARS Coronavirus 2: NEGATIVE

## 2018-12-29 ENCOUNTER — Other Ambulatory Visit: Payer: Self-pay

## 2018-12-29 ENCOUNTER — Encounter (HOSPITAL_BASED_OUTPATIENT_CLINIC_OR_DEPARTMENT_OTHER): Payer: Self-pay | Admitting: *Deleted

## 2018-12-29 NOTE — Progress Notes (Signed)

## 2018-12-29 NOTE — Progress Notes (Addendum)
Spoke w/ pt via phone for pre-op interview.  Npo after mn.  Arrive at 0530.  Needs cbc, cmp.  Current ekg in chart and epic.  Will take zocor  and norvasc am dos w/ sips of water.  Pt cardiologist, dr berry, lov note dated 04-23-2018 in chart and epic.  Pt denies cardiac S&S. Pt had covid test done 12-26-2018.

## 2018-12-30 ENCOUNTER — Ambulatory Visit (HOSPITAL_BASED_OUTPATIENT_CLINIC_OR_DEPARTMENT_OTHER): Payer: Medicare Other | Admitting: Anesthesiology

## 2018-12-30 ENCOUNTER — Encounter (HOSPITAL_BASED_OUTPATIENT_CLINIC_OR_DEPARTMENT_OTHER): Payer: Self-pay | Admitting: *Deleted

## 2018-12-30 ENCOUNTER — Ambulatory Visit (HOSPITAL_BASED_OUTPATIENT_CLINIC_OR_DEPARTMENT_OTHER)
Admission: RE | Admit: 2018-12-30 | Discharge: 2018-12-30 | Disposition: A | Discharge status: Home / Self Care | Payer: Medicare Other | Attending: Obstetrics & Gynecology | Admitting: Obstetrics & Gynecology

## 2018-12-30 ENCOUNTER — Other Ambulatory Visit: Payer: Self-pay

## 2018-12-30 ENCOUNTER — Encounter (HOSPITAL_BASED_OUTPATIENT_CLINIC_OR_DEPARTMENT_OTHER)
Admission: RE | Disposition: A | Discharge status: Home / Self Care | Payer: Self-pay | Source: Home / Self Care | Attending: Obstetrics & Gynecology

## 2018-12-30 DIAGNOSIS — M1990 Unspecified osteoarthritis, unspecified site: Secondary | ICD-10-CM | POA: Insufficient documentation

## 2018-12-30 DIAGNOSIS — D250 Submucous leiomyoma of uterus: Secondary | ICD-10-CM | POA: Diagnosis not present

## 2018-12-30 DIAGNOSIS — N950 Postmenopausal bleeding: Secondary | ICD-10-CM | POA: Diagnosis not present

## 2018-12-30 DIAGNOSIS — I2510 Atherosclerotic heart disease of native coronary artery without angina pectoris: Secondary | ICD-10-CM | POA: Diagnosis not present

## 2018-12-30 DIAGNOSIS — Z96651 Presence of right artificial knee joint: Secondary | ICD-10-CM | POA: Diagnosis not present

## 2018-12-30 DIAGNOSIS — E785 Hyperlipidemia, unspecified: Secondary | ICD-10-CM | POA: Diagnosis not present

## 2018-12-30 DIAGNOSIS — N840 Polyp of corpus uteri: Secondary | ICD-10-CM | POA: Insufficient documentation

## 2018-12-30 DIAGNOSIS — D251 Intramural leiomyoma of uterus: Secondary | ICD-10-CM | POA: Diagnosis not present

## 2018-12-30 DIAGNOSIS — Z888 Allergy status to other drugs, medicaments and biological substances status: Secondary | ICD-10-CM | POA: Insufficient documentation

## 2018-12-30 DIAGNOSIS — G4733 Obstructive sleep apnea (adult) (pediatric): Secondary | ICD-10-CM | POA: Diagnosis not present

## 2018-12-30 DIAGNOSIS — Z79899 Other long term (current) drug therapy: Secondary | ICD-10-CM | POA: Insufficient documentation

## 2018-12-30 DIAGNOSIS — I10 Essential (primary) hypertension: Secondary | ICD-10-CM | POA: Diagnosis not present

## 2018-12-30 HISTORY — DX: Leiomyoma of uterus, unspecified: D25.9

## 2018-12-30 HISTORY — PX: DILATATION & CURETTAGE/HYSTEROSCOPY WITH MYOSURE: SHX6511

## 2018-12-30 HISTORY — DX: Personal history of colon polyps, unspecified: Z86.0100

## 2018-12-30 HISTORY — DX: Personal history of colonic polyps: Z86.010

## 2018-12-30 HISTORY — DX: Obstructive sleep apnea (adult) (pediatric): G47.33

## 2018-12-30 HISTORY — DX: Obstructive sleep apnea (adult) (pediatric): Z99.89

## 2018-12-30 HISTORY — DX: Postmenopausal bleeding: N95.0

## 2018-12-30 HISTORY — DX: Presence of dental prosthetic device (complete) (partial): Z97.2

## 2018-12-30 LAB — CBC
HCT: 41.5 % (ref 36.0–46.0)
Hemoglobin: 13.1 g/dL (ref 12.0–15.0)
MCH: 29.2 pg (ref 26.0–34.0)
MCHC: 31.6 g/dL (ref 30.0–36.0)
MCV: 92.6 fL (ref 80.0–100.0)
Platelets: 158 10*3/uL (ref 150–400)
RBC: 4.48 MIL/uL (ref 3.87–5.11)
RDW: 13.5 % (ref 11.5–15.5)
WBC: 4.9 10*3/uL (ref 4.0–10.5)
nRBC: 0 % (ref 0.0–0.2)

## 2018-12-30 LAB — BASIC METABOLIC PANEL
Anion gap: 12 (ref 5–15)
BUN: 16 mg/dL (ref 8–23)
CO2: 23 mmol/L (ref 22–32)
Calcium: 9.6 mg/dL (ref 8.9–10.3)
Chloride: 105 mmol/L (ref 98–111)
Creatinine, Ser: 0.82 mg/dL (ref 0.44–1.00)
GFR calc Af Amer: >60 mL/min (ref >60)
GFR calc non Af Amer: >60 mL/min (ref >60)
Glucose, Bld: 145 mg/dL — ABNORMAL HIGH (ref 70–99)
Potassium: 3.4 mmol/L — ABNORMAL LOW (ref 3.5–5.1)
Sodium: 140 mmol/L (ref 135–145)

## 2018-12-30 SURGERY — DILATATION & CURETTAGE/HYSTEROSCOPY WITH MYOSURE
Anesthesia: General

## 2018-12-30 MED ORDER — LACTATED RINGERS IV SOLN
INTRAVENOUS | Status: DC
  Administered 2018-12-30: 06:00:00 via INTRAVENOUS
  Filled 2018-12-30: qty 1000

## 2018-12-30 MED ORDER — SODIUM CHLORIDE 0.9 % IR SOLN
Status: DC | PRN
  Administered 2018-12-30: 3000 mL

## 2018-12-30 MED ORDER — PHENYLEPHRINE 40 MCG/ML (10ML) SYRINGE FOR IV PUSH (FOR BLOOD PRESSURE SUPPORT)
PREFILLED_SYRINGE | INTRAVENOUS | Status: AC
  Filled 2018-12-30: qty 10

## 2018-12-30 MED ORDER — DEXAMETHASONE SODIUM PHOSPHATE 10 MG/ML IJ SOLN
INTRAMUSCULAR | Status: DC | PRN
  Administered 2018-12-30: 5 mg via INTRAVENOUS

## 2018-12-30 MED ORDER — FENTANYL CITRATE (PF) 100 MCG/2ML IJ SOLN
INTRAMUSCULAR | Status: AC
  Filled 2018-12-30: qty 2

## 2018-12-30 MED ORDER — DEXAMETHASONE SODIUM PHOSPHATE 10 MG/ML IJ SOLN
INTRAMUSCULAR | Status: AC
  Filled 2018-12-30: qty 1

## 2018-12-30 MED ORDER — FENTANYL CITRATE (PF) 100 MCG/2ML IJ SOLN
INTRAMUSCULAR | Status: DC | PRN
  Administered 2018-12-30: 50 ug via INTRAVENOUS

## 2018-12-30 MED ORDER — LIDOCAINE 2% (20 MG/ML) 5 ML SYRINGE
INTRAMUSCULAR | Status: DC | PRN
  Administered 2018-12-30: 100 mg via INTRAVENOUS

## 2018-12-30 MED ORDER — PHENYLEPHRINE HCL (PRESSORS) 10 MG/ML IV SOLN
INTRAVENOUS | Status: DC | PRN
  Administered 2018-12-30: 40 ug via INTRAVENOUS
  Administered 2018-12-30: 80 ug via INTRAVENOUS
  Administered 2018-12-30: 120 ug via INTRAVENOUS
  Administered 2018-12-30: 80 ug via INTRAVENOUS

## 2018-12-30 MED ORDER — HYDROCODONE-ACETAMINOPHEN 5-325 MG PO TABS: 1.0000 | ORAL_TABLET | Freq: Four times a day (QID) | ORAL | 0 refills | Status: DC | PRN

## 2018-12-30 MED ORDER — IBUPROFEN 600 MG PO TABS: 600.0000 mg | ORAL_TABLET | Freq: Three times a day (TID) | ORAL | 0 refills | Status: DC | PRN

## 2018-12-30 MED ORDER — KETOROLAC TROMETHAMINE 30 MG/ML IJ SOLN
INTRAMUSCULAR | Status: AC
  Filled 2018-12-30: qty 1

## 2018-12-30 MED ORDER — KETOROLAC TROMETHAMINE 15 MG/ML IJ SOLN
INTRAMUSCULAR | Status: DC | PRN
  Administered 2018-12-30: 15 mg via INTRAVENOUS

## 2018-12-30 MED ORDER — MEPERIDINE HCL 25 MG/ML IJ SOLN
6.2500 mg | INTRAMUSCULAR | Status: DC | PRN
  Filled 2018-12-30: qty 1

## 2018-12-30 MED ORDER — ONDANSETRON HCL 4 MG/2ML IJ SOLN
INTRAMUSCULAR | Status: AC
  Filled 2018-12-30: qty 2

## 2018-12-30 MED ORDER — PROPOFOL 10 MG/ML IV BOLUS
INTRAVENOUS | Status: DC | PRN
  Administered 2018-12-30: 150 mg via INTRAVENOUS

## 2018-12-30 MED ORDER — ONDANSETRON HCL 4 MG/2ML IJ SOLN
INTRAMUSCULAR | Status: DC | PRN
  Administered 2018-12-30: 4 mg via INTRAVENOUS

## 2018-12-30 MED ORDER — LIDOCAINE HCL 1 % IJ SOLN
INTRAMUSCULAR | Status: DC | PRN
  Administered 2018-12-30: 10 mL

## 2018-12-30 MED ORDER — LIDOCAINE 2% (20 MG/ML) 5 ML SYRINGE
INTRAMUSCULAR | Status: AC
  Filled 2018-12-30: qty 5

## 2018-12-30 MED ORDER — METOCLOPRAMIDE HCL 5 MG/ML IJ SOLN
10.0000 mg | Freq: Once | INTRAMUSCULAR | Status: DC | PRN
  Filled 2018-12-30: qty 2

## 2018-12-30 MED ORDER — PROPOFOL 10 MG/ML IV BOLUS
INTRAVENOUS | Status: AC
  Filled 2018-12-30: qty 40

## 2018-12-30 MED ORDER — ARTIFICIAL TEARS OPHTHALMIC OINT
TOPICAL_OINTMENT | OPHTHALMIC | Status: AC
  Filled 2018-12-30: qty 3.5

## 2018-12-30 MED ORDER — FENTANYL CITRATE (PF) 100 MCG/2ML IJ SOLN
25.0000 ug | INTRAMUSCULAR | Status: DC | PRN
  Filled 2018-12-30: qty 1

## 2018-12-30 SURGICAL SUPPLY — 25 items
BIPOLAR CUTTING LOOP 21FR (ELECTRODE)
CANISTER SUCT 3000ML PPV (MISCELLANEOUS) ×2 IMPLANT
CATH ROBINSON RED A/P 16FR (CATHETERS) ×2 IMPLANT
COVER WAND RF STERILE (DRAPES) ×2 IMPLANT
DEVICE MYOSURE LITE (MISCELLANEOUS) ×1 IMPLANT
DEVICE MYOSURE REACH (MISCELLANEOUS) IMPLANT
DILATOR CANAL MILEX (MISCELLANEOUS) IMPLANT
GAUZE 4X4 16PLY RFD (DISPOSABLE) ×2 IMPLANT
GLOVE BIOGEL PI IND STRL 7.0 (GLOVE) ×1 IMPLANT
GLOVE BIOGEL PI INDICATOR 7.0 (GLOVE) ×1
GLOVE ECLIPSE 6.5 STRL STRAW (GLOVE) ×4 IMPLANT
GOWN STRL REUS W/ TWL LRG LVL3 (GOWN DISPOSABLE) ×1 IMPLANT
GOWN STRL REUS W/ TWL XL LVL3 (GOWN DISPOSABLE) ×1 IMPLANT
GOWN STRL REUS W/TWL LRG LVL3 (GOWN DISPOSABLE) ×1
GOWN STRL REUS W/TWL XL LVL3 (GOWN DISPOSABLE) ×1
IV NS IRRIG 3000ML ARTHROMATIC (IV SOLUTION) ×3 IMPLANT
KIT PROCEDURE FLUENT (KITS) ×2 IMPLANT
KIT TURNOVER CYSTO (KITS) ×2 IMPLANT
LOOP CUTTING BIPOLAR 21FR (ELECTRODE) IMPLANT
PACK VAGINAL MINOR WOMEN LF (CUSTOM PROCEDURE TRAY) ×2 IMPLANT
PAD OB MATERNITY 4.3X12.25 (PERSONAL CARE ITEMS) ×2 IMPLANT
SEAL CERVICAL OMNI LOK (ABLATOR) IMPLANT
SEAL ROD LENS SCOPE MYOSURE (ABLATOR) ×2 IMPLANT
TOWEL OR 17X26 10 PK STRL BLUE (TOWEL DISPOSABLE) ×3 IMPLANT
WATER STERILE IRR 500ML POUR (IV SOLUTION) ×2 IMPLANT

## 2018-12-30 NOTE — H&P (Signed)
Kathryn Kent is an 83 y.o. female G73A1 WAAF here for hysteroscopy with possible polyp resection, D&C due to PMP bleeding.  Ultrasound showed multiple fibroids but endometrial assessment was not possible due to calcifications of fibroids.  Additional evaluation has been recommended and risks and benefits reviewed.  She is here and ready to proceed.  Pertinent Gynecological History: Menses: post-menopausal Bleeding: post menopausal bleeding Contraception: post menopausal status DES exposure: denies Blood transfusions: yes in 1986 Sexually transmitted diseases: no past history Previous GYN Procedures: none  Last mammogram: normal Date: 10/19 Last pap: normal Date: 3/17 OB History: G1, P0   Menstrual History: No LMP recorded. Patient is postmenopausal.    Past Medical History:  Diagnosis Date  . Anemia   . Coronary artery disease cardiologist---dr berry   cardiac cath --   10/16/05 revealing noncritical CAD  . Cyst of left kidney    small  . Diverticulosis   . Heart murmur   . History of colon polyps   . History of kidney stones   . Hyperlipidemia   . Hypertension   . Leiomyoma of uterus   . Liver cyst    small  . OSA on CPAP   . Osteoarthritis   . Periumbilical hernia    Moderate  . PMB (postmenopausal bleeding)   . Vulvar cyst   . Wears dentures    upper    Past Surgical History:  Procedure Laterality Date  . APPENDECTOMY  age 65   and removal cyst  . CARDIAC CATHETERIZATION  10-16-2005   dr berry   nonobstructive cad w/ mid segmental LAD 30-40% ,  normal LVF  . COLONOSCOPY  06/24/2017  . CYSTOSCOPY W/ URETERAL STENT PLACEMENT Right 01/03/2017   Procedure: CYSTOSCOPY WITH RETROGRADE PYELOGRAM/ URETEROSCOPY/ STONE BASKETRY/URETERAL STENT PLACEMENT;  Surgeon: Cleon Gustin, MD;  Location: [facility];  Service: Urology;  Laterality: Right;  . FOOT ARTHRODESIS Bilateral 1987   great toe's  . great toe surgery Bilateral   . HOLMIUM LASER  APPLICATION Right 2/35/3614   Procedure: HOLMIUM LASER APPLICATION;  Surgeon: Cleon Gustin, MD;  Location: [facility];  Service: Urology;  Laterality: Right;  . TOTAL KNEE ARTHROPLASTY Right 01/20/2018   Procedure: RIGHT TOTAL KNEE ARTHROPLASTY;  Surgeon: Gaynelle Arabian, MD;  Location: [facility];  Service: Orthopedics;  Laterality: Right;  with block    Family History  Problem Relation Age of Onset  . Prostate cancer Father        ?  Marland Kitchen Alcohol abuse Mother   . Aneurysm Brother        heart  . Stroke Sister   . Breast cancer Sister 44  . Lung cancer Brother   . Colon cancer Neg Hx     Social History:  reports that she has never smoked. She has never used smokeless tobacco. She reports previous alcohol use. She reports that she does not use drugs.  Allergies:  Allergies  Allergen Reactions  . Lipitor [Atorvastatin]     Stomach pains, heart attack sx's    Medications Prior to Admission  Medication Sig Dispense Refill Last Dose  . acetaminophen (TYLENOL) 500 MG tablet Take 1,000 mg by mouth every 6 (six) hours as needed for mild pain.   Past Week at Unknown time  . amLODipine (NORVASC) 10 MG tablet Take 10 mg by mouth daily.    12/29/2018 at Unknown time  . chlorthalidone (HYGROTON) 25 MG tablet Take 0.5 tablets by mouth daily.  12/29/2018 at Unknown time  . irbesartan (AVAPRO) 300 MG tablet Take 1 tablet by mouth daily.   1 12/29/2018 at Unknown time  . Multiple Vitamin (MULTIVITAMIN) tablet Take 1 tablet by mouth daily.   Past Week at Unknown time  . simvastatin (ZOCOR) 40 MG tablet Take 20 mg by mouth 2 (two) times daily.    12/29/2018 at Unknown time    Review of Systems  All other systems reviewed and are negative.   Blood pressure (!) 175/97, pulse 89, temperature 98.2 F (36.8 C), temperature source Oral, resp. rate 18, height 5\' 4"  (1.626 m), weight 98.3 kg, SpO2 98 %. Physical Exam  Constitutional: She is oriented to person, place, and time. She appears  well-developed and well-nourished.  Cardiovascular: Normal rate and regular rhythm.  Respiratory: Effort normal and breath sounds normal.  Neurological: She is alert and oriented to person, place, and time.  Skin: Skin is warm and dry.  Psychiatric: She has a normal mood and affect.    Results for orders placed or performed during the hospital encounter of 12/30/18 (from the past 24 hour(s))  Basic metabolic panel     Status: Abnormal   Collection Time: 12/30/18  5:51 AM  Result Value Ref Range   Sodium 140 135 - 145 mmol/L   Potassium 3.4 (L) 3.5 - 5.1 mmol/L   Chloride 105 98 - 111 mmol/L   CO2 23 22 - 32 mmol/L   Glucose, Bld 145 (H) 70 - 99 mg/dL   BUN 16 8 - 23 mg/dL   Creatinine, Ser 0.82 0.44 - 1.00 mg/dL   Calcium 9.6 8.9 - 10.3 mg/dL   GFR calc non Af Amer >60 >60 mL/min   GFR calc Af Amer >60 >60 mL/min   Anion gap 12 5 - 15  CBC     Status: None   Collection Time: 12/30/18  5:51 AM  Result Value Ref Range   WBC 4.9 4.0 - 10.5 K/uL   RBC 4.48 3.87 - 5.11 MIL/uL   Hemoglobin 13.1 12.0 - 15.0 g/dL   HCT 41.5 36.0 - 46.0 %   MCV 92.6 80.0 - 100.0 fL   MCH 29.2 26.0 - 34.0 pg   MCHC 31.6 30.0 - 36.0 g/dL   RDW 13.5 11.5 - 15.5 %   Platelets 158 150 - 400 K/uL   nRBC 0.0 0.0 - 0.2 %    No results found.  Assessment/Plan: 43 G1A1 WAAF here for additional evaluation of PMP bleeding with hysteroscopy possible polyp resection, D&C.  She is here and ready to proceed.  Megan Salon 12/30/2018, 7:08 AM

## 2018-12-30 NOTE — Anesthesia Preprocedure Evaluation (Addendum)
Anesthesia Evaluation  Patient identified by MRN, date of birth, ID band Patient awake    Reviewed: Allergy & Precautions, NPO status , Patient's Chart, lab work & pertinent test results  Airway Mallampati: II  TM Distance: >3 FB Neck ROM: Full    Dental  (+) Upper Dentures, Dental Advisory Given, Poor Dentition,    Pulmonary sleep apnea and Continuous Positive Airway Pressure Ventilation ,    Pulmonary exam normal breath sounds clear to auscultation       Cardiovascular hypertension, Pt. on medications + CAD  Normal cardiovascular exam+ dysrhythmias Atrial Fibrillation  Rhythm:Regular Rate:Normal     Neuro/Psych negative neurological ROS  negative psych ROS   GI/Hepatic negative GI ROS, (+)     substance abuse  alcohol use,   Endo/Other  negative endocrine ROS  Renal/GU negative Renal ROS  negative genitourinary   Musculoskeletal negative musculoskeletal ROS (+)   Abdominal   Peds negative pediatric ROS (+)  Hematology negative hematology ROS (+)   Anesthesia Other Findings   Reproductive/Obstetrics negative OB ROS                            Anesthesia Physical  Anesthesia Plan  ASA: III  Anesthesia Plan: General   Post-op Pain Management:    Induction: Intravenous  PONV Risk Score and Plan: 4 or greater and Ondansetron and Treatment may vary due to age or medical condition  Airway Management Planned: LMA  Additional Equipment:   Intra-op Plan:   Post-operative Plan:   Informed Consent: I have reviewed the patients History and Physical, chart, labs and discussed the procedure including the risks, benefits and alternatives for the proposed anesthesia with the patient or authorized representative who has indicated his/her understanding and acceptance.     Dental advisory given  Plan Discussed with: CRNA  Anesthesia Plan Comments:         Anesthesia Quick  Evaluation

## 2018-12-30 NOTE — Transfer of Care (Signed)
Immediate Anesthesia Transfer of Care Note  Patient: Kathryn Kent  Procedure(s) Performed: DILATATION & CURETTAGE/HYSTEROSCOPY WITH MYOSURE (N/A )  Patient Location: PACU  Anesthesia Type:General  Level of Consciousness: awake, alert , oriented and patient cooperative  Airway & Oxygen Therapy: Patient Spontanous Breathing and Patient connected to nasal cannula oxygen  Post-op Assessment: Report given to RN and Post -op Vital signs reviewed and stable  Post vital signs: Reviewed and stable  Last Vitals:  Vitals Value Taken Time  BP    Temp    Pulse    Resp    SpO2      Last Pain:  Vitals:   12/30/18 0539  TempSrc: Oral         Complications: No apparent anesthesia complications

## 2018-12-30 NOTE — Anesthesia Procedure Notes (Signed)
Procedure Name: LMA Insertion Date/Time: 12/30/2018 7:26 AM Performed by: Wanita Chamberlain, CRNA Pre-anesthesia Checklist: Patient identified, Emergency Drugs available, Suction available, Timeout performed and Patient being monitored Patient Re-evaluated:Patient Re-evaluated prior to induction Oxygen Delivery Method: Circle system utilized Preoxygenation: Pre-oxygenation with 100% oxygen Induction Type: IV induction Ventilation: Mask ventilation without difficulty LMA: LMA inserted LMA Size: 4.0 Number of attempts: 2 Placement Confirmation: breath sounds checked- equal and bilateral,  CO2 detector and positive ETCO2 Tube secured with: Tape Dental Injury: Teeth and Oropharynx as per pre-operative assessment

## 2018-12-30 NOTE — Discharge Instructions (Addendum)
Post-surgical Instructions, Outpatient Surgery  You may expect to feel dizzy, weak, and drowsy for as long as 24 hours after receiving the medicine that made you sleep (anesthetic). For the first 24 hours after your surgery:    Do not drive a car, ride a bicycle, participate in physical activities, or take public transportation until you are done taking narcotic pain medicines or as directed by Dr. Sabra Heck.   Do not drink alcohol or take tranquilizers.   Do not take medicine that has not been prescribed by your physicians.   Do not sign important papers or make important decisions while on narcotic pain medicines.   Have a responsible person with you.   PAIN MANAGEMENT  Motrin 600mg .  (This is the same as three-200mg  over the counter tablets of Motrin or ibuprofen.)  You may take this every eight hours or as needed for cramping.  You can alternate this with 500mg  Tylenol as well.  You can take the Motrin and then four hours later if needed, the Tylenol, and then four hours later, the Motrin. NO MOTRIN/IBUPROFEN UNTIL AFTER 4:00PM TODAY.  Vicodin 5/325mg .  For more severe pain, take one or two tablets every six hours as needed for pain control.  (Remember that narcotic pain medications increase your risk of constipation.  If this becomes a problem, you may take an over the counter stool softener like Colace 100mg  up to four times a day.)  If you take the Vicodin, do not take Tylenol at the same time.  DO'S AND DON'T'S  Do not take a tub bath for one week.  You may shower on the first day after your surgery  Do not do any heavy lifting for one to two weeks.  This increases the chance of bleeding.  Do move around as you feel able.  Stairs are fine.  You may begin to exercise again as you feel able.  Do not lift any weights for two weeks.  Do not put anything in the vagina for two weeks--no tampons, intercourse, or douching.    REGULAR MEDIATIONS/VITAMINS:  You may restart all of your  regular medications as prescribed.  You may restart all of your vitamins as you normally take them.    PLEASE CALL OR SEEK MEDICAL CARE IF:  You have persistent nausea and vomiting.   You have trouble eating or drinking.   You have an oral temperature above 100.5.   You have constipation that is not helped by adjusting diet or increasing fluid intake. Pain medicines are a common cause of constipation.   You have heavy vaginal bleeding   Post Anesthesia Home Care Instructions  Activity: Get plenty of rest for the remainder of the day. A responsible individual must stay with you for 24 hours following the procedure.  For the next 24 hours, DO NOT: -Drive a car -Paediatric nurse -Drink alcoholic beverages -Take any medication unless instructed by your physician -Make any legal decisions or sign important papers.  Meals: Start with liquid foods such as gelatin or soup. Progress to regular foods as tolerated. Avoid greasy, spicy, heavy foods. If nausea and/or vomiting occur, drink only clear liquids until the nausea and/or vomiting subsides. Call your physician if vomiting continues.  Special Instructions/Symptoms: Your throat may feel dry or sore from the anesthesia or the breathing tube placed in your throat during surgery. If this causes discomfort, gargle with warm salt water. The discomfort should disappear within 24 hours.  If you had a scopolamine patch placed  behind your ear for the management of post- operative nausea and/or vomiting:  1. The medication in the patch is effective for 72 hours, after which it should be removed.  Wrap patch in a tissue and discard in the trash. Wash hands thoroughly with soap and water. 2. You may remove the patch earlier than 72 hours if you experience unpleasant side effects which may include dry mouth, dizziness or visual disturbances. 3. Avoid touching the patch. Wash your hands with soap and water after contact with the patch.

## 2018-12-30 NOTE — Anesthesia Postprocedure Evaluation (Signed)
Anesthesia Post Note  Patient: Kathryn Kent  Procedure(s) Performed: DILATATION & CURETTAGE/HYSTEROSCOPY WITH MYOSURE (N/A )     Patient location during evaluation: PACU Anesthesia Type: General Level of consciousness: awake and alert Pain management: pain level controlled Vital Signs Assessment: post-procedure vital signs reviewed and stable Respiratory status: spontaneous breathing, nonlabored ventilation, respiratory function stable and patient connected to nasal cannula oxygen Cardiovascular status: blood pressure returned to baseline and stable Postop Assessment: no apparent nausea or vomiting Anesthetic complications: no    Last Vitals:  Vitals:   12/30/18 0845 12/30/18 0928  BP: 132/79 138/78  Pulse: 68 72  Resp: 18 14  Temp:  36.4 C  SpO2: 95% 97%    Last Pain:  Vitals:   12/30/18 0928  TempSrc:   PainSc: 0-No pain                 Montez Hageman

## 2018-12-30 NOTE — Op Note (Signed)
12/30/2018  8:05 AM  PATIENT:  Kathryn Kent  83 y.o. female  PRE-OPERATIVE DIAGNOSIS:  PMB, uterine fibroids  POST-OPERATIVE DIAGNOSIS:  PMB, uterine fibroids, endometrial polyps  PROCEDURE:  Procedure(s): DILATATION & CURETTAGE/HYSTEROSCOPY WITH MYOSURE  SURGEON:  Megan Salon  ASSISTANTS: OR staff   ANESTHESIA:   general  ESTIMATED BLOOD LOSS: 5 mL  BLOOD ADMINISTERED:none   FLUIDS: 600cc LR  UOP: 100 cc clear OP  SPECIMEN:  Endometrial curetting and polyps  DISPOSITION OF SPECIMEN:  PATHOLOGY  FINDINGS: two submucosal fibroids notes, two endometrial polyps  DESCRIPTION OF OPERATION: Patient was taken to the operating room.  She is placed in the supine position. SCDs were on her lower extremities and functioning properly. General anesthesia with an LMA was administered without difficulty. Dr. Barbarann Ehlers, anesthesia, oversaw case.  Legs were then placed in the [facility] in the low lithotomy position. The legs were lifted to the high lithotomy position and the Betadine prep was used on the inner thighs perineum and vagina x3. Patient was draped in a normal standard fashion. An in and out catheterization with a red rubber Foley catheter was performed. Approximately 100 cc of clear urine was noted. A bivalve speculum was placed the vagina. The anterior lip of the cervix was grasped with single-tooth tenaculum.  A paracervical block of 1% lidocaine without epinephrine was placed.  10 cc was used total. The cervix is dilated up to #21 [facility] dilators. The endometrial cavity sounded to 7 cm.   A 3.7 millimeter diagnostic hysteroscope was obtained. Normal saline was used as a hysteroscopic fluid. The hysteroscope was advanced through the endocervical canal into the endometrial cavity. The tubal ostia were noted bilaterally. Additional findings included anterior submucosal fibroid with right anterior attachment and a small submucosal fibroid at the left fundus.  Using the Myosure  lite device the polyps were resected and the endometrium sampled.  The fibroids were left alone as they were clearly calcified and very hard to the touch with the hysteroscope.  The hysteroscope was removed. A #1 toothed curette was used to curette the cavity until rough gritty texture was noted in all quadrants. With revisualization of the hysteroscope, there were no polyps and endometrial sampling appeared adequate.  The fibroids were still present.  At this point no other procedure was needed and this procedure was ended. The hysteroscope was removed. The fluid deficit was 200 cc. The tenaculum was removed from the anterior lip of the cervix. The speculum was removed from the vagina. The prep was cleansed of the patient's skin. The legs are positioned back in the supine position. Sponge, lap, needle, initially counts were correct x2. Patient was taken to recovery in stable condition.  COUNTS:  YES  PLAN OF CARE: Transfer to PACU

## 2018-12-31 ENCOUNTER — Encounter (HOSPITAL_BASED_OUTPATIENT_CLINIC_OR_DEPARTMENT_OTHER): Payer: Self-pay | Admitting: Obstetrics & Gynecology

## 2019-01-02 ENCOUNTER — Telehealth: Payer: Self-pay

## 2019-01-02 NOTE — Telephone Encounter (Signed)
Spoke with patient and notified of pathology showing benign endometrial polyps. Patient states she only had a small amount of spotting today, otherwise she has felt great. She wanted to thank Dr.Miller for everything.  Routed to provider to sign and close encounter.

## 2019-01-02 NOTE — Telephone Encounter (Signed)
-----   Message from Megan Salon, MD sent at 01/02/2019  7:26 AM EDT ----- Please let pt know her pathology showed benign endometrial polyps.  It is ok if she is still having some spotting.  Will you please get an update for me about her as well?  Thanks.

## 2019-01-06 ENCOUNTER — Other Ambulatory Visit: Payer: Self-pay | Admitting: Obstetrics & Gynecology

## 2019-01-06 NOTE — Telephone Encounter (Signed)
Please check with pt and see if this is actually needed.  She had a hysteroscopy last week so this was for post op cramping.

## 2019-01-06 NOTE — Telephone Encounter (Signed)
Medication refill request: Ibuprofen Last OV:  12/02/18 Next OV: 01/15/19 Last MMG (if hormonal medication request): 03/25/18 BIRADS 1 negative/density b Refill authorized: Please advise on refill if needed

## 2019-01-07 NOTE — Telephone Encounter (Signed)
Prescription request has been refused.

## 2019-01-07 NOTE — Telephone Encounter (Signed)
Spoke with patient, she stated that she does not need the prescription. Per patient, she has not had to take any of the Ibuprofen that was prescribed after Hysteroscopy.

## 2019-01-12 ENCOUNTER — Other Ambulatory Visit: Payer: Self-pay

## 2019-01-15 ENCOUNTER — Encounter: Payer: Self-pay | Admitting: Obstetrics & Gynecology

## 2019-01-15 ENCOUNTER — Ambulatory Visit (INDEPENDENT_AMBULATORY_CARE_PROVIDER_SITE_OTHER): Payer: Medicare Other | Admitting: Obstetrics & Gynecology

## 2019-01-15 ENCOUNTER — Other Ambulatory Visit: Payer: Self-pay

## 2019-01-15 VITALS — BP 136/80 | HR 92 | Temp 97.1°F | Ht 64.0 in | Wt 220.0 lb

## 2019-01-15 DIAGNOSIS — D250 Submucous leiomyoma of uterus: Secondary | ICD-10-CM | POA: Diagnosis not present

## 2019-01-15 DIAGNOSIS — N840 Polyp of corpus uteri: Secondary | ICD-10-CM | POA: Diagnosis not present

## 2019-01-15 NOTE — Progress Notes (Signed)
Post Operative Visit  Procedure: Dilatation and Curettage/Hysteroscopy with Myosure  Days Post-op: 16  Subjective: Doing well.  Hasn't had any bleeding/spotting for four days.  Had several endometrial polyps present with hysteroscopy.  Never needed anything for pain.  Has not had any fevers.  Denies vaginal discharge.  Pathology reviewed.  Benign endometrial polyps present.  Submucosal fibroid was present as well but as these are always benign, this was left intact and not resected.    Objective: BP 136/80   Pulse 92   Temp (!) 97.1 F (36.2 C) (Temporal)   Ht 5\' 4"  (1.626 m)   Wt 220 lb (99.8 kg)   BMI 37.76 kg/m   EXAM General: alert and no distress Resp: clear to auscultation bilaterally Cardio: regular rate and rhythm, S1, S2 normal, no murmur, click, rub or gallop GI: soft, non-tender; bowel sounds normal; no masses,  no organomegaly Extremities: extremities normal, atraumatic, no cyanosis or edema Vaginal Bleeding: none  Gyn:  NAEFG, vagina without lesions, cervical os close, no CMT  Assessment: s/p hysteroscopic polyp resection Uterine fibroids including submucosal fibroid  Plan: Pt will call with any future bleeding.

## 2019-02-23 ENCOUNTER — Other Ambulatory Visit: Payer: Self-pay | Admitting: Internal Medicine

## 2019-02-23 DIAGNOSIS — Z1231 Encounter for screening mammogram for malignant neoplasm of breast: Secondary | ICD-10-CM

## 2019-04-02 DIAGNOSIS — N201 Calculus of ureter: Secondary | ICD-10-CM | POA: Diagnosis not present

## 2019-04-08 ENCOUNTER — Other Ambulatory Visit: Payer: Self-pay

## 2019-04-08 ENCOUNTER — Ambulatory Visit
Admission: RE | Admit: 2019-04-08 | Discharge: 2019-04-08 | Disposition: A | Discharge status: Home / Self Care | Payer: Medicare Other | Source: Ambulatory Visit | Attending: Internal Medicine | Admitting: Internal Medicine

## 2019-04-08 DIAGNOSIS — Z1231 Encounter for screening mammogram for malignant neoplasm of breast: Secondary | ICD-10-CM

## 2019-05-20 ENCOUNTER — Telehealth (HOSPITAL_COMMUNITY): Payer: Self-pay | Admitting: *Deleted

## 2019-05-20 NOTE — Telephone Encounter (Signed)
Attempted to reach pt to schedule appt requested by Dr. Joylene Draft

## 2019-05-25 ENCOUNTER — Telehealth (HOSPITAL_COMMUNITY): Payer: Self-pay | Admitting: *Deleted

## 2019-05-25 NOTE — Telephone Encounter (Signed)
Attempted to reach pt to schedule appt req'd by Dr Joylene Draft

## 2019-10-21 ENCOUNTER — Other Ambulatory Visit: Payer: Self-pay

## 2019-10-21 ENCOUNTER — Encounter: Payer: Self-pay | Admitting: Cardiovascular Disease

## 2019-10-21 ENCOUNTER — Ambulatory Visit: Payer: Medicare Other | Admitting: Cardiovascular Disease

## 2019-10-21 DIAGNOSIS — E782 Mixed hyperlipidemia: Secondary | ICD-10-CM | POA: Diagnosis not present

## 2019-10-21 DIAGNOSIS — I10 Essential (primary) hypertension: Secondary | ICD-10-CM | POA: Diagnosis not present

## 2019-10-21 DIAGNOSIS — I2510 Atherosclerotic heart disease of native coronary artery without angina pectoris: Secondary | ICD-10-CM

## 2019-10-21 NOTE — Assessment & Plan Note (Signed)
History of essential hypertension with blood pressure measured today at 118/72.  She is on amlodipine, Avapro and chlorthalidone.Marland Kitchen

## 2019-10-21 NOTE — Assessment & Plan Note (Signed)
History of hyperlipidemia on statin therapy followed by her PCP.  Her last lipid profile performed 06/12/2019 revealed total cholesterol 204, LDL of 127 and HDL of 57, not at goal for secondary prevention.  My goal is to have her LDL be less than 70 given her moderate CAD by cath in 2007.  This is being followed by her PCP.  She may benefit from being on a more potent statin drug.

## 2019-10-21 NOTE — Assessment & Plan Note (Signed)
History of noncritical CAD by cath performed 10/16/2005 with scattered 30 to 40% stenoses in the RCA and LAD.  She denies chest pain or shortness of breath.

## 2019-10-21 NOTE — Progress Notes (Signed)
10/21/2019 Page Spiro   29-Jun-1935  [id number]  Primary Physician Crist Infante, MD Primary Cardiologist: Lorretta Harp MD Lupe Carney, [place]  HPI:  Kathryn Kent is a 84 y.o.  moderately overweight, widowed [place] American female with no children who is a patient of Dr. Crist Infante. I last saw her  04/23/2018. She has a history of noncritical CAD by cath October 16, 2005, with scattered 30% to 40% stenoses in the RCA and LAD. She denied chest pain or shortness of breath, but does get occasional palpitations. Her other problems include hypertension, hyperlipidemia and obstructive sleep apnea on CPAP which she benefits from. Dr. Joylene Draft follows her lipid profile closely.since I saw her last she's been completely asymptomatic She was seen by Daune Perch, NP 12/04/2017 for preoperative clearance before left total knee replacement which was performed successfully by Dr. Wynelle Link 01/20/2018.  She has completely rehabilitated.  Since I saw her a year and a half ago she continues to do well.  She denies chest pain or shortness of breath.  Dr. Joylene Draft follows her lipid profile.   Current Meds  Medication Sig  . acetaminophen (TYLENOL) 500 MG tablet Take 1,000 mg by mouth every 6 (six) hours as needed for mild pain.  Marland Kitchen amLODipine (NORVASC) 10 MG tablet Take 10 mg by mouth daily.   . chlorthalidone (HYGROTON) 25 MG tablet Take 0.5 tablets by mouth daily.   Marland Kitchen ibuprofen (ADVIL) 600 MG tablet Take 1 tablet (600 mg total) by mouth every 8 (eight) hours as needed.  . irbesartan (AVAPRO) 300 MG tablet Take 1 tablet by mouth daily.   . montelukast (SINGULAIR) 10 MG tablet Take 1 tablet by mouth at bedtime.  . Multiple Vitamin (MULTIVITAMIN) tablet Take 1 tablet by mouth daily.  . simvastatin (ZOCOR) 40 MG tablet Take 20 mg by mouth 2 (two) times daily.      Allergies  Allergen Reactions  . Lipitor [Atorvastatin]     Stomach pains, heart attack sx's    Social History    Socioeconomic History  . Marital status: Widowed    Spouse name: Not on file  . Number of children: 0  . Years of education: Not on file  . Highest education level: Not on file  Occupational History  . Occupation: retired  Tobacco Use  . Smoking status: Never Smoker  . Smokeless tobacco: Never Used  Substance and Sexual Activity  . Alcohol use: Not Currently  . Drug use: Never  . Sexual activity: Not Currently    Birth control/protection: Post-menopausal  Other Topics Concern  . Not on file  Social History Narrative  . Not on file   Social Determinants of Health   Financial Resource Strain:   . Difficulty of Paying Living Expenses:   Food Insecurity:   . Worried About Charity fundraiser in the Last Year:   . Arboriculturist in the Last Year:   Transportation Needs:   . Film/video editor (Medical):   Marland Kitchen Lack of Transportation (Non-Medical):   Physical Activity:   . Days of Exercise per Week:   . Minutes of Exercise per Session:   Stress:   . Feeling of Stress :   Social Connections:   . Frequency of Communication with Friends and Family:   . Frequency of Social Gatherings with Friends and Family:   . Attends Religious Services:   . Active Member of Clubs or Organizations:   . Attends Archivist  Meetings:   Marland Kitchen Marital Status:   Intimate Partner Violence:   . Fear of Current or Ex-Partner:   . Emotionally Abused:   Marland Kitchen Physically Abused:   . Sexually Abused:      Review of Systems: General: negative for chills, fever, night sweats or weight changes.  Cardiovascular: negative for chest pain, dyspnea on exertion, edema, orthopnea, palpitations, paroxysmal nocturnal dyspnea or shortness of breath Dermatological: negative for rash Respiratory: negative for cough or wheezing Urologic: negative for hematuria Abdominal: negative for nausea, vomiting, diarrhea, bright red blood per rectum, melena, or hematemesis Neurologic: negative for visual changes,  syncope, or dizziness All other systems reviewed and are otherwise negative except as noted above.    Blood pressure 118/72, pulse 83, height 5\' 4"  (1.626 m), weight 207 lb 12.8 oz (94.3 kg), SpO2 98 %.  General appearance: alert and no distress Neck: no adenopathy, no carotid bruit, no JVD, supple, symmetrical, trachea midline and thyroid not enlarged, symmetric, no tenderness/mass/nodules Lungs: clear to auscultation bilaterally Heart: regular rate and rhythm, S1, S2 normal, no murmur, click, rub or gallop Extremities: extremities normal, atraumatic, no cyanosis or edema Pulses: 2+ and symmetric Skin: Skin color, texture, turgor normal. No rashes or lesions Neurologic: Alert and oriented X 3, normal strength and tone. Normal symmetric reflexes. Normal coordination and gait  EKG sinus rhythm 83 with septal Q waves.  I personally reviewed this EKG.  ASSESSMENT AND PLAN:   Essential hypertension History of essential hypertension with blood pressure measured today at 118/72.  She is on amlodipine, Avapro and chlorthalidone..  Hyperlipidemia History of hyperlipidemia on statin therapy followed by her PCP.  Her last lipid profile performed 06/12/2019 revealed total cholesterol 204, LDL of 127 and HDL of 57, not at goal for secondary prevention.  My goal is to have her LDL be less than 70 given her moderate CAD by cath in 2007.  This is being followed by her PCP.  She may benefit from being on a more potent statin drug.  Coronary arteriosclerosis History of noncritical CAD by cath performed 10/16/2005 with scattered 30 to 40% stenoses in the RCA and LAD.  She denies chest pain or shortness of breath.      Lorretta Harp MD FACP,FACC,FAHA, [facility] 10/21/2019 2:03 PM

## 2019-10-21 NOTE — Patient Instructions (Signed)

## 2020-03-25 ENCOUNTER — Other Ambulatory Visit: Payer: Self-pay | Admitting: Internal Medicine

## 2020-03-25 DIAGNOSIS — Z1231 Encounter for screening mammogram for malignant neoplasm of breast: Secondary | ICD-10-CM

## 2020-04-18 ENCOUNTER — Ambulatory Visit
Admission: RE | Admit: 2020-04-18 | Discharge: 2020-04-18 | Disposition: A | Discharge status: Home / Self Care | Payer: Medicare Other | Source: Ambulatory Visit | Attending: Internal Medicine | Admitting: Internal Medicine

## 2020-04-18 ENCOUNTER — Other Ambulatory Visit: Payer: Self-pay

## 2020-04-18 DIAGNOSIS — Z1231 Encounter for screening mammogram for malignant neoplasm of breast: Secondary | ICD-10-CM

## 2020-11-11 ENCOUNTER — Other Ambulatory Visit: Payer: Self-pay

## 2020-11-11 ENCOUNTER — Encounter: Payer: Self-pay | Admitting: Cardiovascular Disease

## 2020-11-11 ENCOUNTER — Ambulatory Visit: Payer: Medicare Other | Admitting: Cardiovascular Disease

## 2020-11-11 VITALS — BP 130/82 | HR 75 | Ht 64.0 in | Wt 214.8 lb

## 2020-11-11 DIAGNOSIS — I10 Essential (primary) hypertension: Secondary | ICD-10-CM

## 2020-11-11 DIAGNOSIS — E782 Mixed hyperlipidemia: Secondary | ICD-10-CM

## 2020-11-11 DIAGNOSIS — I2510 Atherosclerotic heart disease of native coronary artery without angina pectoris: Secondary | ICD-10-CM

## 2020-11-11 NOTE — Progress Notes (Signed)
11/11/2020 Page Spiro   12-06-1935  [id number]  Primary Physician Crist Infante, MD Primary Cardiologist: Lorretta Harp MD Lupe Carney, [place]  HPI:  Kathryn Kent is a 85 y.o.  moderatelyoverweight, widowed [place] American female with no children who is a patient of Dr. Crist Infante. I last saw her 10/21/2019. She has a history of noncritical CAD by cath October 16, 2005, with scattered 30% to 40% stenoses in the RCA and LAD. She denied chest pain or shortness of breath, but does get occasional palpitations. Her other problems include hypertension, hyperlipidemia and obstructive sleep apnea on CPAP which she benefits from. Dr. Joylene Draft follows her lipid profile closely.since I saw her last she's been completely asymptomatic  She was seen by Sherlyn Hay Hammond,NP6/05/2018 for preoperative clearance before left total knee replacement which was performed successfully by Dr. Wynelle Link 01/20/2018. She has completely rehabilitated.  Since I saw her a year ago, she is remained completely asymptomatic.  Her lipid profile is followed by her PCP, Dr. Joylene Draft.   Current Meds  Medication Sig  . acetaminophen (TYLENOL) 500 MG tablet Take 1,000 mg by mouth every 6 (six) hours as needed for mild pain.  Marland Kitchen amLODipine (NORVASC) 10 MG tablet Take 10 mg by mouth daily.   . chlorthalidone (HYGROTON) 25 MG tablet Take 0.5 tablets by mouth daily.   Marland Kitchen ezetimibe (ZETIA) 10 MG tablet Take 10 mg by mouth daily.  Marland Kitchen ibuprofen (ADVIL) 600 MG tablet Take 1 tablet (600 mg total) by mouth every 8 (eight) hours as needed.  . irbesartan (AVAPRO) 300 MG tablet Take 1 tablet by mouth daily.   . montelukast (SINGULAIR) 10 MG tablet Take 1 tablet by mouth at bedtime.  . Multiple Vitamin (MULTIVITAMIN) tablet Take 1 tablet by mouth daily.  . simvastatin (ZOCOR) 40 MG tablet Take 20 mg by mouth 2 (two) times daily.      Allergies  Allergen Reactions  . Lipitor [Atorvastatin]     Stomach pains, heart  attack sx's    Social History   Socioeconomic History  . Marital status: Widowed    Spouse name: Not on file  . Number of children: 0  . Years of education: Not on file  . Highest education level: Not on file  Occupational History  . Occupation: retired  Tobacco Use  . Smoking status: Never Smoker  . Smokeless tobacco: Never Used  Vaping Use  . Vaping Use: Never used  Substance and Sexual Activity  . Alcohol use: Not Currently  . Drug use: Never  . Sexual activity: Not Currently    Birth control/protection: Post-menopausal  Other Topics Concern  . Not on file  Social History Narrative  . Not on file   Social Determinants of Health   Financial Resource Strain: Not on file  Food Insecurity: Not on file  Transportation Needs: Not on file  Physical Activity: Not on file  Stress: Not on file  Social Connections: Not on file  Intimate Partner Violence: Not on file     Review of Systems: General: negative for chills, fever, night sweats or weight changes.  Cardiovascular: negative for chest pain, dyspnea on exertion, edema, orthopnea, palpitations, paroxysmal nocturnal dyspnea or shortness of breath Dermatological: negative for rash Respiratory: negative for cough or wheezing Urologic: negative for hematuria Abdominal: negative for nausea, vomiting, diarrhea, bright red blood per rectum, melena, or hematemesis Neurologic: negative for visual changes, syncope, or dizziness All other systems reviewed and are otherwise negative except as  noted above.    Blood pressure 130/82, pulse 75, height 5\' 4"  (1.626 m), weight 214 lb 12.8 oz (97.4 kg), SpO2 94 %.  General appearance: alert and no distress Neck: no adenopathy, no carotid bruit, no JVD, supple, symmetrical, trachea midline and thyroid not enlarged, symmetric, no tenderness/mass/nodules Lungs: clear to auscultation bilaterally Heart: regular rate and rhythm, S1, S2 normal, no murmur, click, rub or gallop Extremities:  extremities normal, atraumatic, no cyanosis or edema Pulses: 2+ and symmetric Skin: Skin color, texture, turgor normal. No rashes or lesions Neurologic: Alert and oriented X 3, normal strength and tone. Normal symmetric reflexes. Normal coordination and gait  EKG sinus rhythm at 75 with borderline LVH, septal Q waves and nonspecific ST and T wave changes.  I personally reviewed this EKG.  ASSESSMENT AND PLAN:   Essential hypertension History of essential hypertension a blood pressure measured today at 130/82.  She is on amlodipine, chlorthalidone and Avapro.  Hyperlipidemia History of hyperlipidemia on Zetia and simvastatin with lipid profile performed 07/04/2020 revealing total cholesterol 191, LDL 113 and HDL 57.  Coronary arteriosclerosis History of noncritical CAD by cath performed 10/16/2005 with scattered 30 to 40% stenoses in the RCA and LAD.  She is totally asymptomatic.      Lorretta Harp MD FACP,FACC,FAHA, [facility] 11/11/2020 10:20 AM

## 2020-11-11 NOTE — Patient Instructions (Signed)

## 2020-11-11 NOTE — Assessment & Plan Note (Signed)
History of hyperlipidemia on Zetia and simvastatin with lipid profile performed 07/04/2020 revealing total cholesterol 191, LDL 113 and HDL 57.

## 2020-11-11 NOTE — Assessment & Plan Note (Signed)
History of essential hypertension a blood pressure measured today at 130/82.  She is on amlodipine, chlorthalidone and Avapro.

## 2020-11-11 NOTE — Assessment & Plan Note (Signed)
History of noncritical CAD by cath performed 10/16/2005 with scattered 30 to 40% stenoses in the RCA and LAD.  She is totally asymptomatic.

## 2021-03-20 ENCOUNTER — Other Ambulatory Visit: Payer: Self-pay | Admitting: Internal Medicine

## 2021-03-20 DIAGNOSIS — Z1231 Encounter for screening mammogram for malignant neoplasm of breast: Secondary | ICD-10-CM

## 2021-04-24 ENCOUNTER — Other Ambulatory Visit: Payer: Self-pay

## 2021-04-24 ENCOUNTER — Ambulatory Visit
Admission: RE | Admit: 2021-04-24 | Discharge: 2021-04-24 | Disposition: A | Discharge status: Home / Self Care | Payer: Medicare Other | Source: Ambulatory Visit | Attending: Internal Medicine | Admitting: Internal Medicine

## 2021-04-24 DIAGNOSIS — Z1231 Encounter for screening mammogram for malignant neoplasm of breast: Secondary | ICD-10-CM

## 2021-04-26 ENCOUNTER — Other Ambulatory Visit: Payer: Self-pay | Admitting: Internal Medicine

## 2021-04-26 DIAGNOSIS — R928 Other abnormal and inconclusive findings on diagnostic imaging of breast: Secondary | ICD-10-CM

## 2021-05-12 ENCOUNTER — Other Ambulatory Visit: Payer: Self-pay

## 2021-05-12 ENCOUNTER — Ambulatory Visit
Admission: RE | Admit: 2021-05-12 | Discharge: 2021-05-12 | Disposition: A | Discharge status: Home / Self Care | Payer: Medicare Other | Source: Ambulatory Visit | Attending: Internal Medicine | Admitting: Internal Medicine

## 2021-05-12 ENCOUNTER — Other Ambulatory Visit: Payer: Self-pay | Admitting: Internal Medicine

## 2021-05-12 DIAGNOSIS — R928 Other abnormal and inconclusive findings on diagnostic imaging of breast: Secondary | ICD-10-CM

## 2021-05-24 ENCOUNTER — Other Ambulatory Visit: Payer: Self-pay

## 2021-05-24 ENCOUNTER — Ambulatory Visit
Admission: RE | Admit: 2021-05-24 | Discharge: 2021-05-24 | Disposition: A | Discharge status: Home / Self Care | Payer: Medicare Other | Source: Ambulatory Visit | Attending: Internal Medicine | Admitting: Internal Medicine

## 2021-05-24 DIAGNOSIS — R928 Other abnormal and inconclusive findings on diagnostic imaging of breast: Secondary | ICD-10-CM

## 2021-08-15 ENCOUNTER — Other Ambulatory Visit: Payer: Self-pay | Admitting: Internal Medicine

## 2021-08-15 DIAGNOSIS — R102 Pelvic and perineal pain: Secondary | ICD-10-CM

## 2021-08-15 DIAGNOSIS — K921 Melena: Secondary | ICD-10-CM

## 2021-08-23 DIAGNOSIS — C801 Malignant (primary) neoplasm, unspecified: Secondary | ICD-10-CM

## 2021-08-23 HISTORY — DX: Malignant (primary) neoplasm, unspecified: C80.1

## 2021-08-25 ENCOUNTER — Encounter: Payer: Self-pay | Admitting: Physician Assistant

## 2021-08-25 ENCOUNTER — Other Ambulatory Visit (INDEPENDENT_AMBULATORY_CARE_PROVIDER_SITE_OTHER): Payer: Medicare Other

## 2021-08-25 ENCOUNTER — Ambulatory Visit: Payer: Medicare Other | Admitting: Physician Assistant

## 2021-08-25 VITALS — BP 140/80 | HR 84 | Ht 64.5 in | Wt 193.0 lb

## 2021-08-25 DIAGNOSIS — R102 Pelvic and perineal pain: Secondary | ICD-10-CM

## 2021-08-25 DIAGNOSIS — K6289 Other specified diseases of anus and rectum: Secondary | ICD-10-CM | POA: Diagnosis not present

## 2021-08-25 DIAGNOSIS — R1030 Lower abdominal pain, unspecified: Secondary | ICD-10-CM | POA: Diagnosis not present

## 2021-08-25 LAB — BASIC METABOLIC PANEL
BUN: 13 mg/dL (ref 6–23)
CO2: 29 mEq/L (ref 19–32)
Calcium: 10.2 mg/dL (ref 8.4–10.5)
Chloride: 99 mEq/L (ref 96–112)
Creatinine, Ser: 0.8 mg/dL (ref 0.40–1.20)
GFR: 68.2 mL/min (ref >60.00)
Glucose, Bld: 119 mg/dL — ABNORMAL HIGH (ref 70–99)
Potassium: 3.4 mEq/L — ABNORMAL LOW (ref 3.5–5.1)
Sodium: 137 mEq/L (ref 135–145)

## 2021-08-25 NOTE — Progress Notes (Signed)
Assessment and plan as noted 

## 2021-08-25 NOTE — Progress Notes (Signed)
? ?Subjective:  ? ? Patient ID: Kathryn Kent, female    DOB: February 08, 1936, 86 y.o.   MRN: [id number] ? ?HPI ?Kathryn Kent is a pleasant 86 year old African-American female, established with Dr. Henrene Pastor and known from prior colonoscopy.  She is currently referred back by Dr. Joylene Draft, for evaluation of rectal pain and heme positive stool. ?Patient says that she has developed internal rectal discomfort, very low pelvic discomfort and vaginal pain/discomfort over the past months which has become more constant and is present daily.  She herself has not noted any melena or hematochezia.  She has had some mild constipation.  She denies any pain with defecation.  No hemorrhoidal symptoms.  No vaginal bleeding, and denies any urinary symptoms.  She describes the pain that she has as being sharp. ? ?She reports that she had very recent annual physical with Dr. Joylene Draft on 08/11/2021, she says all of her labs were fine but her stool was documented to be heme positive ? ?She is scheduled to see a gynecologist later this month, and it looks as if CT was to be scheduled but I do not see that that has been done. ? ?She did have colonoscopy here in December 2018 with removal of four 2 mm polyps all of which were tubular adenomas, noted to have multiple diverticuli in the left and right colon and hypertrophic anal papillae ? ?Other medical problems include hypertension, sleep apnea and osteoarthritis. ? ? ?Review of Systems Pertinent positive and negative review of systems were noted in the above HPI section.  All other review of systems was otherwise negative.  ? ?Outpatient Encounter Medications as of 08/25/2021  ?Medication Sig  ? acetaminophen (TYLENOL) 500 MG tablet Take 1,000 mg by mouth every 6 (six) hours as needed for mild pain.  ? amLODipine (NORVASC) 10 MG tablet Take 10 mg by mouth daily.   ? chlorthalidone (HYGROTON) 25 MG tablet Take 0.5 tablets by mouth daily.   ? ezetimibe (ZETIA) 10 MG tablet Take 10 mg by mouth daily.  ?  ibuprofen (ADVIL) 600 MG tablet Take 1 tablet (600 mg total) by mouth every 8 (eight) hours as needed.  ? irbesartan (AVAPRO) 300 MG tablet Take 1 tablet by mouth daily.   ? montelukast (SINGULAIR) 10 MG tablet Take 1 tablet by mouth at bedtime.  ? Multiple Vitamin (MULTIVITAMIN) tablet Take 1 tablet by mouth daily.  ? simvastatin (ZOCOR) 40 MG tablet Take 20 mg by mouth 2 (two) times daily.   ? ?No facility-administered encounter medications on file as of 08/25/2021.  ? ?Allergies  ?Allergen Reactions  ? Lipitor [Atorvastatin]   ?  Stomach pains, heart attack sx's  ? ?Patient Active Problem List  ? Diagnosis Date Noted  ? Postmenopausal bleeding 12/30/2018  ? History of total knee arthroplasty, right 02/04/2018  ? Heart murmur 11/28/2017  ? Osteoarthritis of knee 11/28/2017  ? Intramural leiomyoma of uterus 09/12/2014  ? Sebaceous cyst of labia 09/12/2014  ? Essential hypertension 05/28/2013  ? Hyperlipidemia 05/28/2013  ? Coronary arteriosclerosis 05/28/2013  ? ?Social History  ? ?Socioeconomic History  ? Marital status: Widowed  ?  Spouse name: Not on file  ? Number of children: 0  ? Years of education: Not on file  ? Highest education level: Not on file  ?Occupational History  ? Occupation: retired  ?Tobacco Use  ? Smoking status: Never  ? Smokeless tobacco: Never  ?Vaping Use  ? Vaping Use: Never used  ?Substance and Sexual Activity  ? Alcohol use:  Not Currently  ? Drug use: Never  ? Sexual activity: Not Currently  ?  Birth control/protection: Post-menopausal  ?Other Topics Concern  ? Not on file  ?Social History Narrative  ? Not on file  ? ?Social Determinants of Health  ? ?Financial Resource Strain: Not on file  ?Food Insecurity: Not on file  ?Transportation Needs: Not on file  ?Physical Activity: Not on file  ?Stress: Not on file  ?Social Connections: Not on file  ?Intimate Partner Violence: Not on file  ? ? ?Ms. Gomm's family history includes Alcohol abuse in her mother; Aneurysm in her brother; Breast cancer  (age of onset: 32) in her sister; Lung cancer in her brother; Prostate cancer in her father; Stroke in her sister. ? ? ?   ?Objective:  ?  ?Vitals:  ? 08/25/21 0922  ?BP: 140/80  ?Pulse: 84  ? ? ?Physical Exam Well-developed well-nourished elderly African-American female in no acute distress.  Accompanied by her friend  Weight, 193 BMI 32.6 ? ?HEENT; nontraumatic normocephalic, EOMI, PE R LA, sclera anicteric. ?Oropharynx; not examined today ?Neck; supple, no JVD ?Cardiovascular; regular rate and rhythm with S1-S2, no murmur rub or gallop ?Pulmonary; Clear bilaterally ?Abdomen; soft, nontender, nondistended, there is a firm supraumbilical nodule, nontender ,no palpable hepatosplenomegaly, bowel sounds are active ?Rectal; digital exam negative, stool heme-negative, anoscopy negative-there is firmness to palpation anterior to the rectum ?Skin; benign exam, no jaundice rash or appreciable lesions ?Extremities; no clubbing cyanosis or edema skin warm and dry ?Neuro/Psych; alert and oriented x4, grossly nonfocal mood and affect appropriate  ? ? ? ?   ?Assessment & Plan:  ? ?#49 86 year old African-American female with 89-month history of rectal, vaginal and pelvic pain, and recent Hemoccult positive stool. ? ?Rectal exam and anoscopy today negative with exception of firmness anterior to the rectum, stool heme-negative today ? ?Etiology of her pain is not clear, concerned about pelvic neoplasm ? ?#2 supraumbilical firm nodule ? ?#3 history of tubular adenomatous colon polyps-last colonoscopy December 2018 with removal of 4 diminutive polyps-recall planned due to age ? ?#4 diverticulosis ?#5 hypertension ?6.  Sleep apnea ?7.  Osteoarthritis ? ?Plan; patient will be scheduled for CT scan of the abdomen and pelvis with contrast-we scheduled this for her for next week.Marland Kitchen ?She is advised to keep her appointment with gynecology/Dr. Alla German 09/14/2021 ?Further recommendations pending findings at CT. ? ? ? ? ? ?Saara Kijowski S Leaira Fullam  PA-C ?08/25/2021 ? ? ?Cc: Crist Infante, MD ?  ?

## 2021-08-25 NOTE — Patient Instructions (Addendum)
If you are age 84 or older, your body mass index should be between 23-30. Your Body mass index is 32.62 kg/m?Marland Kitchen If this is out of the aforementioned range listed, please consider follow up with your Primary Care Provider. ?________________________________________________________ ? ?The [facility] providers would like to encourage you to use [facility] to communicate with providers for non-urgent requests or questions.  Due to long hold times on the telephone, sending your provider a message by [facility] may be a faster and more efficient way to get a response.  Please allow 48 business hours for a response.  Please remember that this is for non-urgent requests.  ?_______________________________________________________ ? ?You have been scheduled for a CT scan of the abdomen and pelvis at [facility] ([address]---this is in the same building as [employer]).  ? ?You are scheduled on 08/30/2021 at 3:00 pm. You should arrive 15 minutes prior to your appointment time for registration. Please follow the written instructions below on the day of your exam: ? ?WARNING: IF YOU ARE ALLERGIC TO IODINE/X-RAY DYE, PLEASE NOTIFY RADIOLOGY IMMEDIATELY AT [phone]! YOU WILL BE GIVEN A 13 HOUR PREMEDICATION PREP. ? ?1) Do not eat anything after 11:00 am (4 hours prior to your test) ?2) You have been given 2 bottles of oral contrast to drink. The solution may taste better if refrigerated, but do NOT add ice or any other liquid to this solution. Shake well before drinking. ?  ? Drink 1 bottle of contrast @ 1:00 pm (2 hours prior to your exam) ? Drink 1 bottle of contrast @ 2:00 pm (1 hour prior to your exam) ? ?You may take any medications as prescribed with a small amount of water, if necessary. If you take any of the following medications: METFORMIN, GLUCOPHAGE, GLUCOVANCE, AVANDAMET, RIOMET, FORTAMET, ACTOPLUS MET, JANUMET, [place] or METAGLIP, you MAY be asked to HOLD this medication 48 hours AFTER the  exam. ? ?The purpose of you drinking the oral contrast is to aid in the visualization of your intestinal tract. The contrast solution may cause some diarrhea. Depending on your individual set of symptoms, you may also receive an intravenous injection of x-ray contrast/dye. Plan on being at [facility] for 30 minutes or longer, depending on the type of exam you are having performed. ? ?This test typically takes 30-45 minutes to complete. ? ?If you have any questions regarding your exam or if you need to reschedule, you may call the CT department at [phone] between the hours of 8:00 am and 5:00 pm, Monday-Friday. ? ?________________________________________________________________________ ? ?Keep your appointment with your GYN on 09/14/2021 ? ?Thank you for entrusting me with your care and choosing [facility]. ? ?Amy Esterwood, PA-C ? ?

## 2021-08-30 ENCOUNTER — Telehealth: Payer: Self-pay

## 2021-08-30 ENCOUNTER — Other Ambulatory Visit: Payer: Self-pay

## 2021-08-30 ENCOUNTER — Ambulatory Visit (INDEPENDENT_AMBULATORY_CARE_PROVIDER_SITE_OTHER)
Admission: RE | Admit: 2021-08-30 | Discharge: 2021-08-30 | Disposition: A | Discharge status: Home / Self Care | Payer: Medicare Other | Source: Ambulatory Visit | Attending: Physician Assistant | Admitting: Physician Assistant

## 2021-08-30 DIAGNOSIS — K6289 Other specified diseases of anus and rectum: Secondary | ICD-10-CM | POA: Diagnosis not present

## 2021-08-30 DIAGNOSIS — R102 Pelvic and perineal pain: Secondary | ICD-10-CM

## 2021-08-30 DIAGNOSIS — R1030 Lower abdominal pain, unspecified: Secondary | ICD-10-CM | POA: Diagnosis not present

## 2021-08-30 DIAGNOSIS — C800 Disseminated malignant neoplasm, unspecified: Secondary | ICD-10-CM

## 2021-08-30 DIAGNOSIS — C259 Malignant neoplasm of pancreas, unspecified: Secondary | ICD-10-CM

## 2021-08-30 MED ORDER — IOHEXOL 300 MG/ML  SOLN
100.0000 mL | Freq: Once | INTRAMUSCULAR | Status: AC | PRN
  Administered 2021-08-30: 100 mL via INTRAVENOUS

## 2021-08-30 NOTE — Telephone Encounter (Signed)
Received a call report from [place] at [facility]. Most concerned about bold portions below.  ? ?Dr. Henrene Pastor, Amy is out of the office the remainder of the week. Will you please review? Thanks ? ?IMPRESSION: ?1. Infiltrative mass in the pancreatic tail which measures 4.7 x 4.4 ?cm, highly suspicious for pancreatic adenocarcinoma. ?2. The mass extends into the splenic hilum with findings suspicious ?for involvement of the splenic parenchyma as well as abutment with ?possible invasion of the colonic splenic flexure. ?3. Extensive omental and peritoneal nodularity with trace ?abdominopelvic free fluid, most consistent with carcinomatosis. ?4. Right cardiophrenic adenopathy and right sided pulmonary nodules ?measuring up to 11 mm, concerning for metastatic disease. ?5. Central filling defect within an enlarged left common femoral ?vein, consistent with nonocclusive deep venous thrombosis. ?6. Symmetric wall thickening of the distal esophagus with reflux ?versus retract retained contrast in the distal esophagus. Correlate ?for esophagitis. ?7. Hypodense hepatic lesions measure up to 1 cm in the right lower ?lobe appear to have been present on prior non contrasted CT and ?likely reflect cysts. No new discrete hepatic lesion identified. ?8. Lobular uterine contour with numerous dystrophic calcifications ?likely reflecting uterine leiomyomas. ?9.  Aortic Atherosclerosis (ICD10-I70.0). ?

## 2021-08-31 ENCOUNTER — Telehealth: Payer: Self-pay | Admitting: Physician Assistant

## 2021-08-31 ENCOUNTER — Telehealth: Payer: Self-pay

## 2021-08-31 NOTE — Telephone Encounter (Signed)
Urgent referral to Oncology in epic.  ?Secure staff message sent to Ihor Gully, RN (Oncology nurse navigator). ?

## 2021-08-31 NOTE — Addendum Note (Signed)
Addended by: Yevette Edwards on: 08/31/2021 01:41 PM ? ? Modules accepted: Orders ? ?

## 2021-08-31 NOTE — Telephone Encounter (Signed)
I called and spoke to the patient directly by phone today regarding this abnormal abdominal CT scan. ?I also spoke to Dr. Joylene Draft her primary care provider by phone ? ?I discussed with the patient that it appears that she has pancreatic cancer which has spread into the spleen and possibly colon.  There also appears to be carcinomatosis. ?She needs to be referred ASAP to medical oncology for what appears to be metastatic pancreas cancer ?The patient is aware that this referral is taking place and that she will be called for an appointment. ? ?There appears to be a nonocclusive left femoral vein thrombus; but after further discussion with Dr. Joylene Draft, we are not starting anticoagulation as the patient has heme positive stool and is high risk for bleeding.  I did not focus on this DVT with the patient by phone, given the fact that we are not recommending anticoagulation and the gravity of the other findings on the scan. ? ?She also has an appointment with her gynecologist and she asked if she needed to keep this appointment.  I told her it was likely not needed now. ? ?Please refer ASAP to medical oncology for pancreas cancer ?Patient aware of the referral and upcoming appointment ?

## 2021-08-31 NOTE — Telephone Encounter (Signed)
Scheduled appt per 3/9 referral. Pt is aware of appt date and time. Pt is aware to arrive 15 mins prior to appt time and to bring and updated insurance card. Pt is aware of appt location.   ?

## 2021-08-31 NOTE — Telephone Encounter (Signed)
-----   Message from Royston Bake, RN sent at 08/31/2021  3:02 PM EST ----- ?Regarding: RE: Urgent referral ?She is being scheduled in our rapid diagnostic clinic to be seen by Dede Query, PA-C and Dr Burr Medico.  We will arrange for biopsy. ? ?Thanks ? ?KB ?----- Message ----- ?From: Yevette Edwards, RN ?Sent: 08/31/2021   1:42 PM EST ?To: Royston Bake, RN ?Subject: Urgent referral                               ? ?Hi Santiago Glad, ?We just placed an urgent referral to [facility]. Recent CT scan shows pancreatic cancer which has spread into the spleen and possibly colon.  There also appears to be carcinomatosis. ? ?Thank you, ?Zadyn Yardley P, RN ? ? ? ?

## 2021-08-31 NOTE — Telephone Encounter (Signed)
Dr. Hilarie Fredrickson as DOD PM of 3/9 - I sent this call report to MD yesterday. I have not received any recommendations for pt. Please advise, thanks. ?

## 2021-09-04 ENCOUNTER — Inpatient Hospital Stay: Payer: Medicare Other | Attending: Physician Assistant

## 2021-09-04 ENCOUNTER — Ambulatory Visit: Payer: Medicare Other | Admitting: Emergency Medicine

## 2021-09-04 ENCOUNTER — Inpatient Hospital Stay: Payer: Medicare Other | Admitting: Physician Assistant

## 2021-09-04 ENCOUNTER — Encounter: Payer: Self-pay | Admitting: Physician Assistant

## 2021-09-04 ENCOUNTER — Encounter (HOSPITAL_COMMUNITY): Payer: Self-pay | Admitting: Radiology

## 2021-09-04 ENCOUNTER — Other Ambulatory Visit: Payer: Self-pay

## 2021-09-04 VITALS — BP 149/89 | HR 91 | Temp 97.9°F | Resp 17 | Wt 195.4 lb

## 2021-09-04 DIAGNOSIS — R634 Abnormal weight loss: Secondary | ICD-10-CM | POA: Insufficient documentation

## 2021-09-04 DIAGNOSIS — R918 Other nonspecific abnormal finding of lung field: Secondary | ICD-10-CM | POA: Diagnosis not present

## 2021-09-04 DIAGNOSIS — C7802 Secondary malignant neoplasm of left lung: Secondary | ICD-10-CM | POA: Diagnosis not present

## 2021-09-04 DIAGNOSIS — Z803 Family history of malignant neoplasm of breast: Secondary | ICD-10-CM | POA: Insufficient documentation

## 2021-09-04 DIAGNOSIS — C786 Secondary malignant neoplasm of retroperitoneum and peritoneum: Secondary | ICD-10-CM | POA: Diagnosis not present

## 2021-09-04 DIAGNOSIS — Z801 Family history of malignant neoplasm of trachea, bronchus and lung: Secondary | ICD-10-CM | POA: Diagnosis not present

## 2021-09-04 DIAGNOSIS — I700 Atherosclerosis of aorta: Secondary | ICD-10-CM | POA: Diagnosis not present

## 2021-09-04 DIAGNOSIS — Z8042 Family history of malignant neoplasm of prostate: Secondary | ICD-10-CM | POA: Insufficient documentation

## 2021-09-04 DIAGNOSIS — K8689 Other specified diseases of pancreas: Secondary | ICD-10-CM

## 2021-09-04 DIAGNOSIS — I82412 Acute embolism and thrombosis of left femoral vein: Secondary | ICD-10-CM | POA: Diagnosis not present

## 2021-09-04 DIAGNOSIS — G893 Neoplasm related pain (acute) (chronic): Secondary | ICD-10-CM | POA: Insufficient documentation

## 2021-09-04 DIAGNOSIS — C252 Malignant neoplasm of tail of pancreas: Secondary | ICD-10-CM | POA: Insufficient documentation

## 2021-09-04 DIAGNOSIS — K6289 Other specified diseases of anus and rectum: Secondary | ICD-10-CM | POA: Insufficient documentation

## 2021-09-04 DIAGNOSIS — I10 Essential (primary) hypertension: Secondary | ICD-10-CM | POA: Insufficient documentation

## 2021-09-04 DIAGNOSIS — E041 Nontoxic single thyroid nodule: Secondary | ICD-10-CM | POA: Insufficient documentation

## 2021-09-04 DIAGNOSIS — C7801 Secondary malignant neoplasm of right lung: Secondary | ICD-10-CM | POA: Diagnosis not present

## 2021-09-04 LAB — IRON AND IRON BINDING CAPACITY (CC-WL,HP ONLY)
Iron: 65 ug/dL (ref 28–170)
Saturation Ratios: 21 % (ref 10.4–31.8)
TIBC: 305 ug/dL (ref 250–450)
UIBC: 240 ug/dL (ref 148–442)

## 2021-09-04 LAB — CMP (CANCER CENTER ONLY)
ALT: 15 U/L (ref 0–44)
AST: 15 U/L (ref 15–41)
Albumin: 4.4 g/dL (ref 3.5–5.0)
Alkaline Phosphatase: 63 U/L (ref 38–126)
Anion gap: 8 (ref 5–15)
BUN: 15 mg/dL (ref 8–23)
CO2: 30 mmol/L (ref 22–32)
Calcium: 10.2 mg/dL (ref 8.9–10.3)
Chloride: 101 mmol/L (ref 98–111)
Creatinine: 0.75 mg/dL (ref 0.44–1.00)
GFR, Estimated: >60 mL/min (ref >60)
Glucose, Bld: 95 mg/dL (ref 70–99)
Potassium: 3.7 mmol/L (ref 3.5–5.1)
Sodium: 139 mmol/L (ref 135–145)
Total Bilirubin: 0.9 mg/dL (ref 0.3–1.2)
Total Protein: 7.7 g/dL (ref 6.5–8.1)

## 2021-09-04 LAB — CBC WITH DIFFERENTIAL (CANCER CENTER ONLY)
Abs Immature Granulocytes: 0 10*3/uL (ref 0.00–0.07)
Basophils Absolute: 0 10*3/uL (ref 0.0–0.1)
Basophils Relative: 1 %
Eosinophils Absolute: 0.1 10*3/uL (ref 0.0–0.5)
Eosinophils Relative: 2 %
HCT: 38.5 % (ref 36.0–46.0)
Hemoglobin: 12.9 g/dL (ref 12.0–15.0)
Immature Granulocytes: 0 %
Lymphocytes Relative: 25 %
Lymphs Abs: 1.4 10*3/uL (ref 0.7–4.0)
MCH: 29.3 pg (ref 26.0–34.0)
MCHC: 33.5 g/dL (ref 30.0–36.0)
MCV: 87.5 fL (ref 80.0–100.0)
Monocytes Absolute: 0.5 10*3/uL (ref 0.1–1.0)
Monocytes Relative: 9 %
Neutro Abs: 3.5 10*3/uL (ref 1.7–7.7)
Neutrophils Relative %: 63 %
Platelet Count: 162 10*3/uL (ref 150–400)
RBC: 4.4 MIL/uL (ref 3.87–5.11)
RDW: 13.8 % (ref 11.5–15.5)
WBC Count: 5.6 10*3/uL (ref 4.0–10.5)
nRBC: 0 % (ref 0.0–0.2)

## 2021-09-04 LAB — CEA (IN HOUSE-CHCC): CEA (CHCC-In House): 24.71 ng/mL — ABNORMAL HIGH (ref 0.00–5.00)

## 2021-09-04 LAB — FERRITIN: Ferritin: 272 ng/mL (ref 11–307)

## 2021-09-04 NOTE — Progress Notes (Signed)
Patient Name  ?Page Spiro Legal Sex  ?Female DOB  ?1935-10-25 SSN  ?[id number] Address  ?[place] [address] Phone  ?[phone] (Home)  ?[phone] (Work)  ?[phone] (Mobile) *Preferred*  ? ? RE: [place] CORE BIOPSY (SOFT TISSUE) ?Received: Today ?Arne Cleveland, MD  Garth Bigness D ?Ok  ? ?CT core peritoneal mass  ? ?[facility]   ?  ?   ?Previous Messages ?  ?----- Message -----  ?From: Garth Bigness D  ?Sent: 09/04/2021   1:59 PM EDT  ?To: [facility] Procedure Requests  ?Subject: [place] CORE BIOPSY (SOFT TISSUE)                  ? ?Procedure:  [place] CORE BIOPSY (SOFT TISSUE)  ? ?Reason:  Pancreatic mass, CT scan concerning for metastatic pancreatic cancer involving the peritoneum. Evaluate for periumbilical mass, palpable on physicale exam  ? ?History:  CT in computer  ? ?Provider:  Dede Query T  ? ?Provider Contact:  [phone]  ?

## 2021-09-04 NOTE — Progress Notes (Signed)
[place] [phone]   [phone]  INITIAL CONSULTATION:  Patient Care Team: Crist Infante, MD as PCP - General (Internal Medicine) Lorretta Harp, MD as PCP - Cardiology (Cardiology) Truitt Merle, MD as Consulting Physician (Oncology) Royston Bake, RN as Oncology Nurse Navigator (Oncology)  CHIEF COMPLAINTS/PURPOSE OF CONSULTATION:  Abnormal CT scan concerning for metastatic pancreatic cancer.   ONCOLOGIC HISTORY: 1) Presented to PCP, Dr. Crist Infante, for 3 month history of pain in the rectal, vaginal and pelvic area with positive Hemoccult stool test.  2) 08/25/2021: Evaluated by Dr. Scarlette Shorts from [facility]. Repeat hemoccult stool test was negative. Rectal exam and anoscopy was unremarkable except for firmness anterior to the rectum.    3) 08/30/2021: CT abdomen/pelvis:Infiltrative mass in the pancreatic tail which measures 4.7 x 4.4 cm, highly suspicious for pancreatic adenocarcinoma.The mass extends into the splenic hilum with findings suspicious for involvement of the splenic parenchyma as well as abutment with possible invasion of the colonic splenic flexure. Extensive omental and peritoneal nodularity with trace abdominopelvic free fluid, most consistent with carcinomatosis. Right cardiophrenic adenopathy and right sided pulmonary nodules measuring up to 11 mm, concerning for metastatic disease. 5. Central filling defect within an enlarged left common femoral vein, consistent with nonocclusive deep venous thrombosis. Symmetric wall thickening of the distal esophagus with reflux versus retract retained contrast in the distal esophagus. Hypodense hepatic lesions measure up to 1 cm in the right lower lobe appear to have been present on prior non contrasted CT and likely reflect cysts. No new discrete hepatic lesion identified. Lobular uterine contour with numerous dystrophic calcifications likely reflecting uterine  leiomyomas.Aortic Atherosclerosis   4) 09/04/2021: Established care with [facility]   HISTORY OF PRESENTING ILLNESS:  Kathryn Kent 86 y.o. female with medical history significant for coronary artery disease, diverticulosis, hypertension, hyperlipidemia, obstructive sleep apnea, periumbilical hernia and uterine fibroids. She is accompanied by her niece for this visit.   On exam today, Kathryn Kent reports mild fatigue but she continues to complete all her daily activities on her own. She reports appetite loss and 20-25 lb unintentional weight loss over the last two months.  She denies any nausea or vomiting. She reports lower pelvic abdominal and rectal pain that is occasional but sharp in nature. When the pain occurs, she rates it as 10 out of 10 on a pain scale. She is currently managing the pain with Tylenol 500 mg as needed. She reports daily bowel movements with more flatulence. She denies pain with a bowel movement but does admit to some increased pressure. She denies easy  bruising or signs of active bleeding. This includes hematochezia or melena. Patient denies fevers, chills, night sweats, shortness of breath, chest pain or cough. She has no other complaints. Rest of the 10 point ROS is below.   MEDICAL HISTORY:  Past Medical History:  Diagnosis Date   Anemia    Coronary artery disease cardiologist---dr berry   cardiac cath --   10/16/05 revealing noncritical CAD   Cyst of left kidney    small   Diverticulosis    Heart murmur    History of colon polyps    History of kidney stones    Hyperlipidemia    Hypertension    Leiomyoma of uterus    Liver cyst    small   OSA on CPAP    Osteoarthritis    Periumbilical hernia    Moderate   PMB (postmenopausal bleeding)  Vulvar cyst    Wears dentures    upper    SURGICAL HISTORY: Past Surgical History:  Procedure Laterality Date   APPENDECTOMY  age 62   and removal cyst   CARDIAC CATHETERIZATION  10-16-2005   dr berry    nonobstructive cad w/ mid segmental LAD 30-40% ,  normal LVF   COLONOSCOPY  06/24/2017   CYSTOSCOPY W/ URETERAL STENT PLACEMENT Right 01/03/2017   Procedure: CYSTOSCOPY WITH RETROGRADE PYELOGRAM/ URETEROSCOPY/ STONE BASKETRY/URETERAL STENT PLACEMENT;  Surgeon: Cleon Gustin, MD;  Location: [facility];  Service: Urology;  Laterality: Right;   DILATATION & CURETTAGE/HYSTEROSCOPY WITH MYOSURE N/A 12/30/2018   Procedure: [place];  Surgeon: Megan Salon, MD;  Location: [facility];  Service: Gynecology;  Laterality: N/A;   FOOT ARTHRODESIS Bilateral 1987   great toe's   great toe surgery Bilateral    HOLMIUM LASER APPLICATION Right 11/10/8414   Procedure: HOLMIUM LASER APPLICATION;  Surgeon: Cleon Gustin, MD;  Location: [facility];  Service: Urology;  Laterality: Right;   TOTAL KNEE ARTHROPLASTY Right 01/20/2018   Procedure: RIGHT TOTAL KNEE ARTHROPLASTY;  Surgeon: Gaynelle Arabian, MD;  Location: [facility];  Service: Orthopedics;  Laterality: Right;  with block    SOCIAL HISTORY: Social History   Socioeconomic History   Marital status: Widowed    Spouse name: Not on file   Number of children: 0   Years of education: Not on file   Highest education level: Not on file  Occupational History   Occupation: retired  Tobacco Use   Smoking status: Never   Smokeless tobacco: Never  Vaping Use   Vaping Use: Never used  Substance and Sexual Activity   Alcohol use: Not Currently   Drug use: Never   Sexual activity: Not Currently    Birth control/protection: Post-menopausal  Other Topics Concern   Not on file  Social History Narrative   Not on file   Social Determinants of Health   Financial Resource Strain: Not on file  Food Insecurity: Not on file  Transportation Needs: Not on file  Physical Activity: Not on file  Stress: Not on file  Social Connections: Not on file  Intimate Partner  Violence: Not on file    FAMILY HISTORY: Family History  Problem Relation Age of Onset   Alcohol abuse Mother    Prostate cancer Father        ?   Stroke Sister    Breast cancer Sister 70   Aneurysm Brother        heart   Lung cancer Brother    Colon cancer Neg Hx     ALLERGIES:  is allergic to lipitor [atorvastatin].  MEDICATIONS:  Current Outpatient Medications  Medication Sig Dispense Refill   acetaminophen (TYLENOL) 500 MG tablet Take 1,000 mg by mouth every 6 (six) hours as needed for mild pain.     amLODipine (NORVASC) 10 MG tablet Take 10 mg by mouth daily.      chlorthalidone (HYGROTON) 25 MG tablet Take 0.5 tablets by mouth daily.      ezetimibe (ZETIA) 10 MG tablet Take 10 mg by mouth daily.     ibuprofen (ADVIL) 600 MG tablet Take 1 tablet (600 mg total) by mouth every 8 (eight) hours as needed. 30 tablet 0   irbesartan (AVAPRO) 300 MG tablet Take 1 tablet by mouth daily.   1   montelukast (SINGULAIR) 10 MG tablet Take 1 tablet by mouth  at bedtime.     Multiple Vitamin (MULTIVITAMIN) tablet Take 1 tablet by mouth daily.     simvastatin (ZOCOR) 40 MG tablet Take 20 mg by mouth 2 (two) times daily.      No current facility-administered medications for this visit.    REVIEW OF SYSTEMS:   Constitutional: ( - ) fevers, ( - )  chills , ( - ) night sweats Eyes: ( - ) blurriness of vision, ( - ) double vision, ( - ) watery eyes Ears, nose, mouth, throat, and face: ( - ) mucositis, ( - ) sore throat Respiratory: ( - ) cough, ( - ) dyspnea, ( - ) wheezes Cardiovascular: ( - ) palpitation, ( - ) chest discomfort, ( - ) lower extremity swelling Gastrointestinal:  ( - ) nausea, ( - ) heartburn, ( +) change in bowel habits Skin: ( - ) abnormal skin rashes Lymphatics: ( - ) new lymphadenopathy, ( - ) easy bruising Neurological: ( - ) numbness, ( - ) tingling, ( - ) new weaknesses Behavioral/Psych: ( - ) mood change, ( - ) new changes  All other systems were reviewed with the  patient and are negative.  PHYSICAL EXAMINATION: ECOG PERFORMANCE STATUS: 1 - Symptomatic but completely ambulatory  Vitals:   09/04/21 1232  BP: (!) 149/89  Pulse: 91  Resp: 17  Temp: 97.9 F (36.6 C)  SpO2: 99%   Filed Weights   09/04/21 1232  Weight: 195 lb 7 oz (88.6 kg)    GENERAL: well appearing female in NAD  SKIN: skin color, texture, turgor are normal, no rashes or significant lesions EYES: conjunctiva are pink and non-injected, sclera clear OROPHARYNX: no exudate, no erythema; lips, buccal mucosa, and tongue normal  NECK: supple, non-tender LYMPH:  no palpable lymphadenopathy in the cervical, axillary or supraclavicular lymph nodes.  BREAST: no palpable masses, nipple discharge. Mild discoloration of left breast.  LUNGS: clear to auscultation and percussion with normal breathing effort HEART: regular rate & rhythm and no murmurs and no lower extremity edema ABDOMEN: soft, non-tender, non-distended, normal bowel sounds. Palpable, nontender nodularity above the umbilicus approximately 2-3 cm in diameter.  Musculoskeletal: no cyanosis of digits and no clubbing  PSYCH: alert & oriented x 3, fluent speech NEURO: no focal motor/sensory deficits  LABORATORY DATA:  I have reviewed the data as listed CBC Latest Ref Rng & Units 09/04/2021 12/30/2018 01/22/2018  WBC 4.0 - 10.5 K/uL 5.6 4.9 9.6  Hemoglobin 12.0 - 15.0 g/dL 12.9 13.1 11.3(L)  Hematocrit 36.0 - 46.0 % 38.5 41.5 34.0(L)  Platelets 150 - 400 K/uL 162 158 176    CMP Latest Ref Rng & Units 09/04/2021 08/25/2021 12/30/2018  Glucose 70 - 99 mg/dL 95 119(H) 145(H)  BUN 8 - 23 mg/dL '15 13 16  '$ Creatinine 0.44 - 1.00 mg/dL 0.75 0.80 0.82  Sodium 135 - 145 mmol/L 139 137 140  Potassium 3.5 - 5.1 mmol/L 3.7 3.4(L) 3.4(L)  Chloride 98 - 111 mmol/L 101 99 105  CO2 22 - 32 mmol/L '30 29 23  '$ Calcium 8.9 - 10.3 mg/dL 10.2 10.2 9.6  Total Protein 6.5 - 8.1 g/dL 7.7 - -  Total Bilirubin 0.3 - 1.2 mg/dL 0.9 - -  Alkaline Phos 38 -  126 U/L 63 - -  AST 15 - 41 U/L 15 - -  ALT 0 - 44 U/L 15 - -     RADIOGRAPHIC STUDIES: I have personally reviewed the radiological images as listed and agreed with the findings  in the report. CT Abdomen Pelvis W Contrast  Result Date: 08/30/2021 CLINICAL DATA:  Lower abdominal pain and decreased appetite since November 2022. Pelvic and rectal pain. Insert body on CT EXAM: CT ABDOMEN AND PELVIS WITH CONTRAST TECHNIQUE: Multidetector CT imaging of the abdomen and pelvis was performed using the standard protocol following bolus administration of intravenous contrast. RADIATION DOSE REDUCTION: This exam was performed according to the departmental dose-optimization program which includes automated exposure control, adjustment of the mA and/or kV according to patient size and/or use of iterative reconstruction technique. CONTRAST:  190m OMNIPAQUE IOHEXOL 300 MG/ML  SOLN COMPARISON:  CT urogram January 03, 2017 FINDINGS: Lower chest: Small right pleural effusion. Right sided pulmonary nodules for instance a 11 mm right middle lobe pulmonary nodule on image 3/3 and a 6 mm right lower lobe pulmonary nodule on image 2/3. Right cardiophrenic lymph nodes measure up to 9 mm on image 4/2. Hepatobiliary: Hypodense hepatic lesions measure up to 1 cm on image 28/2 in the right lower lobe appear to have been present on prior non contrasted CT. No definite new hepatic lesions identified. Pancreas: No pancreatic ductal dilation. There is a infiltrative mass in the pancreatic tail which measures 4.7 x 4.4 cm on image 22/2. The mass extends into the splenic hilum with loss of fat plane on image 20/2 as well it abuts and possible invasion of the colonic splenic flexure on coronal image 43/5 and sagittal images 93-102/6. Additionally there is abutment of Gerota's fascia but with preservation of the fat plane between the kidney and the mass best seen on sagittal imaging. Spleen: No splenomegaly.  Splenic involvement as above.  Adrenals/Urinary Tract: Bilateral adrenal glands appear normal. No hydronephrosis. Hypodense subcentimeter renal lesions are too small to accurately characterize. Urinary bladder is nondistended. Stomach/Bowel: Radiopaque enteric contrast material traverses the ascending colon. Symmetric wall thickening of the distal esophagus with reflux versus retract retained contrast in the distal esophagus on image 10/2. No pathologic dilation of small or large bowel. Terminal ileum appears normal. Appendix is not confidently identified. Moderate volume of formed stool in the colon. Vascular/Lymphatic: Aortic atherosclerosis without abdominal aortic aneurysm. Central filling defect within an enlarged left femoral vein for instance on image 70/2 and 30/5, consistent with deep venous thrombosis. No pathologically enlarged abdominal or pelvic lymph nodes. Reproductive: Lobular uterine contour with numerous dystrophic calcifications, similar in appearance to CT July 12, 22878and almost certainly reflecting uterine leiomyomas. No suspicious adnexal mass identified. Other: Extensive omental and peritoneal nodularity with trace abdominopelvic free fluid, consistent with carcinomatosis. Musculoskeletal: Multilevel degenerative changes spine. No aggressive lytic or blastic lesion of bone. IMPRESSION: 1. Infiltrative mass in the pancreatic tail which measures 4.7 x 4.4 cm, highly suspicious for pancreatic adenocarcinoma. 2. The mass extends into the splenic hilum with findings suspicious for involvement of the splenic parenchyma as well as abutment with possible invasion of the colonic splenic flexure. 3. Extensive omental and peritoneal nodularity with trace abdominopelvic free fluid, most consistent with carcinomatosis. 4. Right cardiophrenic adenopathy and right sided pulmonary nodules measuring up to 11 mm, concerning for metastatic disease. 5. Central filling defect within an enlarged left common femoral vein, consistent with  nonocclusive deep venous thrombosis. 6. Symmetric wall thickening of the distal esophagus with reflux versus retract retained contrast in the distal esophagus. Correlate for esophagitis. 7. Hypodense hepatic lesions measure up to 1 cm in the right lower lobe appear to have been present on prior non contrasted CT and likely reflect cysts. No new  discrete hepatic lesion identified. 8. Lobular uterine contour with numerous dystrophic calcifications likely reflecting uterine leiomyomas. 9.  Aortic Atherosclerosis (ICD10-I70.0). These results will be called to the ordering clinician or representative by the Radiologist Assistant, and communication documented in the PACS or [employer]. Electronically Signed   By: Dahlia Bailiff M.D.   On: 08/30/2021 16:05    ASSESSMENT & PLAN Kathryn Kent is a 86 y.o. female who presents to the clinic for evaluation for abnormal CT imaging concerning for metastatic pancreatic cancer. We reviewed the CT abdomen/pelvis from 08/30/2021 in detail. The recommendation is to proceed with biopsy of one of the peritoneal nodules, specifically the palpable periumbilical nodule. Additionally, we will arrange for CT imaging of the chest to complete staging. Patient will follow up in clinic 3-5 days after the biopsy to review results and discuss treatment options.   #Pancreatic tail mass with peritoneal nodularity: --Seen on CT abdomen/pelvis from 08/30/2021 --Need CT chest to complete staging --Need tissue sampling so requested CT guided biopsy of peritoneal mass/nodularity. Currently scheduled for 09/12/2021. --Labs today to check CBC, CMP, iron panel, CA 125, CA 19-9 and CEA levels --RTC with Dr. Burr Medico 3-5 days after biopsy  #Abdominal pain: --Likely secondary to peritoneal disease --Discussed prescription pain medication including tramadol. Patient would like to continue with Tylenol at this time.   #Nonocclusive DVT in left common femoral vein: --Seen on CT abdomen/pelvis  from 08/30/2021 --Patient does not report any edema/pain in left lower extremity --Will defer anticoagulation until biopsy is complete   Orders Placed This Encounter  Procedures   CT Chest W Contrast    Standing Status:   Future    Standing Expiration Date:   09/04/2022    Order Specific Question:   If indicated for the ordered procedure, I authorize the administration of contrast media per Radiology protocol    Answer:   Yes    Order Specific Question:   Preferred imaging location?    Answer:   [facility]   CT GUIDED NEEDLE PLACEMENT    Standing Status:   Future    Standing Expiration Date:   09/05/2022    Order Specific Question:   If indicated for the ordered procedure, I authorize the administration of contrast media per Radiology protocol    Answer:   Yes    Order Specific Question:   Reason for Exam (SYMPTOM  OR DIAGNOSIS REQUIRED)    Answer:   CT scan concerning for metastatic pancreatic cancer involving the peritoneum. Evaluate for periumbilical mass, palpable on physicale exam    Order Specific Question:   Preferred imaging location?    Answer:   [facility]   CBC with Differential ([place] Only)    Standing Status:   Future    Number of Occurrences:   1    Standing Expiration Date:   09/05/2022   CMP ([place] only)    Standing Status:   Future    Number of Occurrences:   1    Standing Expiration Date:   09/05/2022   CA 19.9    Standing Status:   Future    Number of Occurrences:   1    Standing Expiration Date:   09/04/2022   CA 125    Standing Status:   Future    Number of Occurrences:   1    Standing Expiration Date:   09/04/2022   CEA (IN HOUSE-[facility])FOR [facility] ONLY    Standing Status:   Future  Number of Occurrences:   1    Standing Expiration Date:   09/05/2022   Iron and Iron Binding Capacity ([facility] only)    Standing Status:   Future    Number of Occurrences:   1    Standing Expiration Date:   09/05/2022   Ferritin     Standing Status:   Future    Number of Occurrences:   1    Standing Expiration Date:   09/05/2022    All questions were answered. The patient knows to call the clinic with any problems, questions or concerns.  I have spent a total of 60 minutes minutes of face-to-face and non-face-to-face time, preparing to see the patient, obtaining and/or reviewing separately obtained history, performing a medically appropriate examination, counseling and educating the patient, ordering tests/procedures, referring and communicating with other health care professionals, documenting clinical information in the electronic health record,and care coordination.   Dede Query, PA-C Department of Hematology/Oncology [place] at [facility] Phone: [phone]  Patient was seen with Dr. Burr Medico.   Addendum  I have seen the patient, examined her myself. I agree with the assessment and and plan and have edited the notes.   86 yo female with PMH of hypertension, coronary artery disease, sleep apnea, uterine fibroids, presented with intermittent lower abdominal pain, anorexia and weight loss.  I reviewed her CT scan images myself and discussed the findings with patient.  She has a palpable abdominal mass in the mid abdomen, corresponding to the large peritoneal mass on CT scan.  Breast exam was normal. I recommend ultrasound-guided peritoneal mass biopsy by interventional radiology.  Her CT scan is concerning for metastatic pancreatic cancer, but other metastatic disease is also a possibility.  We will check tumor markers.  We will get a CT chest to complete staging.  I plan to see her back 3 to 4 days after biopsy.  All questions were answered.  Truitt Merle  09/04/2021

## 2021-09-04 NOTE — Progress Notes (Signed)
I met with Ms Kathryn Kent and her niece, Kathryn Kent after her consultation with Dede Query, PA-C and Dr Burr Medico.  I explained my role as a nurse navigator and provided my contact information. ?I briefly explained insertion and care of a port a cath.  I showed a sample of the port a cath. ?All questions were answered.  They verbalized understanding. ? ?

## 2021-09-05 LAB — CA 125: Cancer Antigen (CA) 125: 67.7 U/mL — ABNORMAL HIGH (ref 0.0–38.1)

## 2021-09-05 NOTE — Progress Notes (Signed)
I spoke with Ms Kathryn Kent she is agreeable to a f/u appt with Dr Burr Medico on 09/15/2021 at 0800.  I asked her to arrive by 0750 for registration purposes.  All questions were answered.  She verbalized understanding. ? ?

## 2021-09-06 ENCOUNTER — Other Ambulatory Visit: Payer: Self-pay

## 2021-09-06 ENCOUNTER — Ambulatory Visit (HOSPITAL_COMMUNITY)
Admission: RE | Admit: 2021-09-06 | Discharge: 2021-09-06 | Disposition: A | Discharge status: Home / Self Care | Payer: Medicare Other | Source: Ambulatory Visit | Attending: Physician Assistant | Admitting: Physician Assistant

## 2021-09-06 ENCOUNTER — Encounter (HOSPITAL_COMMUNITY): Payer: Self-pay

## 2021-09-06 DIAGNOSIS — K8689 Other specified diseases of pancreas: Secondary | ICD-10-CM | POA: Insufficient documentation

## 2021-09-06 MED ORDER — SODIUM CHLORIDE (PF) 0.9 % IJ SOLN
INTRAMUSCULAR | Status: AC
  Filled 2021-09-06: qty 50

## 2021-09-06 MED ORDER — IOHEXOL 300 MG/ML  SOLN
75.0000 mL | Freq: Once | INTRAMUSCULAR | Status: AC | PRN
  Administered 2021-09-06: 75 mL via INTRAVENOUS

## 2021-09-06 NOTE — Progress Notes (Signed)
The proposed treatment discussed in conference is for discussion purpose only and is not a binding recommendation.  The patients have not been physically examined, or presented with their treatment options.  Therefore, final treatment plans cannot be decided.  

## 2021-09-07 LAB — CANCER ANTIGEN 19-9: CA 19-9: 1208 U/mL — ABNORMAL HIGH (ref 0–35)

## 2021-09-11 ENCOUNTER — Other Ambulatory Visit: Payer: Self-pay | Admitting: Radiology

## 2021-09-12 ENCOUNTER — Other Ambulatory Visit: Payer: Self-pay

## 2021-09-12 ENCOUNTER — Ambulatory Visit (HOSPITAL_COMMUNITY)
Admission: RE | Admit: 2021-09-12 | Discharge: 2021-09-12 | Disposition: A | Discharge status: Home / Self Care | Payer: Medicare Other | Source: Ambulatory Visit | Attending: Physician Assistant | Admitting: Physician Assistant

## 2021-09-12 ENCOUNTER — Other Ambulatory Visit: Payer: Self-pay | Admitting: Physician Assistant

## 2021-09-12 DIAGNOSIS — K8689 Other specified diseases of pancreas: Secondary | ICD-10-CM

## 2021-09-12 DIAGNOSIS — C786 Secondary malignant neoplasm of retroperitoneum and peritoneum: Secondary | ICD-10-CM | POA: Insufficient documentation

## 2021-09-12 MED ORDER — LIDOCAINE HCL (PF) 1 % IJ SOLN
INTRAMUSCULAR | Status: AC
  Filled 2021-09-12: qty 30

## 2021-09-12 NOTE — Progress Notes (Signed)
Whitney, RN called to inform short stay that transport was coming to get the patient and we did not have to get the patient ready. ?

## 2021-09-12 NOTE — Procedures (Signed)
Interventional Radiology Procedure Note ? ?Procedure: US guided biopsy of omental mass, ant abdominal wall mass at the umbilicus ? ?Complications: None ?EBL: None ?Specimen: Mx 18g core bx ?Recommendations: ?- DC now ?- Routine wound care ?- Follow up pathology ?- Advance diet  ? ?Signed, ? ?Corrie Mckusick, DO ? ? ?

## 2021-09-13 LAB — SURGICAL PATHOLOGY

## 2021-09-14 ENCOUNTER — Ambulatory Visit (HOSPITAL_BASED_OUTPATIENT_CLINIC_OR_DEPARTMENT_OTHER): Payer: Medicare Other | Admitting: Obstetrics & Gynecology

## 2021-09-15 ENCOUNTER — Encounter: Payer: Self-pay | Admitting: Hematology

## 2021-09-15 ENCOUNTER — Inpatient Hospital Stay: Payer: Medicare Other | Admitting: Hematology

## 2021-09-15 ENCOUNTER — Other Ambulatory Visit: Payer: Self-pay

## 2021-09-15 ENCOUNTER — Other Ambulatory Visit: Payer: Self-pay | Admitting: Genetic Counselor

## 2021-09-15 ENCOUNTER — Ambulatory Visit: Payer: Medicare Other

## 2021-09-15 VITALS — BP 157/89 | HR 97 | Temp 98.5°F | Resp 20 | Ht 64.0 in | Wt 194.4 lb

## 2021-09-15 DIAGNOSIS — C252 Malignant neoplasm of tail of pancreas: Secondary | ICD-10-CM | POA: Diagnosis not present

## 2021-09-15 DIAGNOSIS — C259 Malignant neoplasm of pancreas, unspecified: Secondary | ICD-10-CM | POA: Insufficient documentation

## 2021-09-15 MED ORDER — LIDOCAINE-PRILOCAINE 2.5-2.5 % EX CREA: TOPICAL_CREAM | CUTANEOUS | 3 refills | Status: DC

## 2021-09-15 MED ORDER — PROCHLORPERAZINE MALEATE 10 MG PO TABS: 10.0000 mg | ORAL_TABLET | Freq: Four times a day (QID) | ORAL | 1 refills | Status: DC | PRN

## 2021-09-15 MED ORDER — ONDANSETRON HCL 8 MG PO TABS: 8.0000 mg | ORAL_TABLET | Freq: Two times a day (BID) | ORAL | 1 refills | Status: DC | PRN

## 2021-09-15 NOTE — Progress Notes (Addendum)
[facility]   Telephone:(336) [phone] Fax:(336) [phone]   Clinic Follow up Note   Patient Care Team: Rodrigo Ran, MD as PCP - General (Internal Medicine) Runell Gess, MD as PCP - Cardiology (Cardiology) Malachy Mood, MD as Consulting Physician (Oncology) Marily Lente, RN as Oncology Nurse Navigator (Oncology)  Date of Service:  09/15/2021  CHIEF COMPLAINT: f/u of metastatic pancreatic cancer  CURRENT THERAPY:  PENDING Gemcitabine/abraxane   ASSESSMENT & PLAN:  Kathryn Kent is a 86 y.o. female with   1. Metastatic Pancreatic Cancer to peritoneum and lungs, stage IV  -presented to PCP with rectal, vaginal, and pelvic area pain, as well as positive Hemoccult stool test. CT AP on 08/30/21 showed 4.7 cm pancreatic tail mass extending into splenic hilum and suspicious for involvement of splenic parenchyma and abutment with possible invasion of colonic splenic flexure; extensive omental and peritoneal nodularity; right cardiophrenic adenopathy and right-sided pulmonary nodules. -baseline tumor markers on 09/04/21 were all elevated-- CEA of 24.71, CA 125 of 67.7, and CA 19.9 of 1,208. -staging chest CT on 09/06/21 showed: nodularity and thickening throughout right lung; bilateral pulmonary nodules in lower lungs, R>L, up to 11 mm; small right pleural effusion; enlarged right cardiophrenic lymph nodes. I reviewed the images and results today and discussed we can watch the lung nodules with imaging. -biopsy of omental mass on 09/12/21 confirmed metastatic adenocarcinoma, compatible with pancreatic primary based on imaging. -I reviewed the pathology results with them today. I discussed that this is indicative of metastatic pancreatic cancer. While this is no longer curable, it is certainly treatable. I reviewed the standard treatment options with them, which include IV chemotherapy, oral chemotherapy, and possibly immunotherapy. I will order additional testing on her biopsy  sample to see if she is eligible for immunotherapy. --I discussed systemic therapy options. She is not a candidate for FOLFIRINOX due to her age. I recommend first line chemo with gemcitabine with abraxane every 2 weeks. Given her age, my recommendation is to start with gemcitabine alone for first cycle and add abraxane if she tolerates well. Our nurse navigator Clydie Braun discussed port placement with them, but we can give cycle 1 peripherally if needed. --Chemotherapy consent: Side effects including but does not not limited to, fatigue, nausea, vomiting, diarrhea, hair loss, neuropathy, fluid retention, renal and kidney dysfunction, neutropenic fever, needed for blood transfusion, bleeding, were discussed with patient in great detail. She agrees to proceed. -I will also make an urgent referral to genetics with lab. -we will plan to start next week.  2. Weight Loss -secondary to #1 -chart review shows weight loss of about 20 lbs from 10/2020-08/2021. -I will refer them to our dietician, to also help her maintain her weight during chemo.  3. Thyroid Nodule -incidental subcentimeter left thyroid nodule with heterogeneous and enlarged thyroid on chest CT 09/06/21.  4.  Goal of care discussion  -We again discussed the incurable nature of her cancer, and the overall poor prognosis, especially if he/she does not have good response to chemotherapy or progress on chemo -The patient understands the goal of care is palliative. -she is full code now     PLAN: -urgent referral to genetics with lab -port placement by IR in next few weeks  -chemo education -referral to nutrition -lab and gemcitabine in 1 and 2 weeks -f/u in 2 weeks, plan to add Abraxane from cycle 2 if she tolerates chemo well    No problem-specific Assessment & Plan notes found for this  encounter.   SUMMARY OF ONCOLOGIC HISTORY: Oncology History  Pancreatic cancer (HCC)  08/30/2021 Imaging   EXAM: CT ABDOMEN AND PELVIS WITH  CONTRAST  IMPRESSION: 1. Infiltrative mass in the pancreatic tail which measures 4.7 x 4.4 cm, highly suspicious for pancreatic adenocarcinoma. 2. The mass extends into the splenic hilum with findings suspicious for involvement of the splenic parenchyma as well as abutment with possible invasion of the colonic splenic flexure. 3. Extensive omental and peritoneal nodularity with trace abdominopelvic free fluid, most consistent with carcinomatosis. 4. Right cardiophrenic adenopathy and right sided pulmonary nodules measuring up to 11 mm, concerning for metastatic disease. 5. Central filling defect within an enlarged left common femoral vein, consistent with nonocclusive deep venous thrombosis. 6. Symmetric wall thickening of the distal esophagus with reflux versus retract retained contrast in the distal esophagus. Correlate for esophagitis. 7. Hypodense hepatic lesions measure up to 1 cm in the right lower lobe appear to have been present on prior non contrasted CT and likely reflect cysts. No new discrete hepatic lesion identified.  8. Lobular uterine contour with numerous dystrophic calcifications likely reflecting uterine leiomyomas. 9.  Aortic Atherosclerosis (ICD10-I70.0).   09/06/2021 Imaging   EXAM: CT CHEST WITH CONTRAST  IMPRESSION: 1. Fissural nodularity and fissural thickening throughout the right lung concerning for metastatic disease.   2. Additionally there are bilateral pulmonary nodules in the lower lungs, right greater than left, measuring up to 11 mm suspicious for metastatic disease.   3.  Small right pleural effusion.   4. Enlarged right cardiophrenic lymph nodes suspicious for metastatic disease.   5. Subcentimeter incidental left thyroid nodule with heterogeneous and enlarged thyroid. Recommend thyroid US. Reference: J Am Coll Radiol. 2015 Feb;12(2): 143-50   4. Pancreatic mass, peritoneal metastases and hypodense liver lesions are unchanged from  08/30/2021.   09/12/2021 Initial Biopsy   FINAL MICROSCOPIC DIAGNOSIS:   A.   OMENTAL MASS, NEEDLE CORE BIOPSY:  -    Metastatic adenocarcinoma, see Comment.   COMMENT:  The adenocarcinoma was interrogated with immunohistochemical (IHC) stains (CK7, CK20, PAX8, CDX-2, GATA-3). The malignant cells are CK7 positive, CK20 negative and have weak CDX-2 reactivity. The tumor cells are negative for GATA-3 (breast marker) and have weak/equivocal nuclear reactivity with PAX8.  This immunophenotype is non-specific, but is compatible with a pancreatic ductal primary, which is the favored primary site based on imaging studies. The controls are satisfactory.    09/15/2021 Initial Diagnosis   Pancreatic cancer [facility])      INTERVAL HISTORY:  Kathryn Kent is here for a follow up of metastatic pancreatic cancer. She was last seen by me on 09/04/21 in consultation with PA Karena Addison. She presents to the clinic accompanied by her niece, whom she currently lives with (of note, a different niece than who accompanied her previously). She reports her pain is not as bad. She notes the biopsy went very well.   All other systems were reviewed with the patient and are negative.  MEDICAL HISTORY:  Past Medical History:  Diagnosis Date   Anemia    Coronary artery disease cardiologist---dr berry   cardiac cath --   10/16/05 revealing noncritical CAD   Cyst of left kidney    small   Diverticulosis    Heart murmur    History of colon polyps    History of kidney stones    Hyperlipidemia    Hypertension    Leiomyoma of uterus    Liver cyst    small   OSA  on CPAP    Osteoarthritis    Periumbilical hernia    Moderate   PMB (postmenopausal bleeding)    Vulvar cyst    Wears dentures    upper    SURGICAL HISTORY: Past Surgical History:  Procedure Laterality Date   APPENDECTOMY  age 49   and removal cyst   CARDIAC CATHETERIZATION  10-16-2005   dr berry   nonobstructive cad w/ mid segmental LAD 30-40% ,   normal LVF   COLONOSCOPY  06/24/2017   CYSTOSCOPY W/ URETERAL STENT PLACEMENT Right 01/03/2017   Procedure: CYSTOSCOPY WITH RETROGRADE PYELOGRAM/ URETEROSCOPY/ STONE BASKETRY/URETERAL STENT PLACEMENT;  Surgeon: Malen Gauze, MD;  Location: [facility];  Service: Urology;  Laterality: Right;   DILATATION & CURETTAGE/HYSTEROSCOPY WITH MYOSURE N/A 12/30/2018   Procedure: DILATATION & CURETTAGE/HYSTEROSCOPY WITH MYOSURE;  Surgeon: Jerene Bears, MD;  Location: [facility];  Service: Gynecology;  Laterality: N/A;   FOOT ARTHRODESIS Bilateral 1987   great toe's   great toe surgery Bilateral    HOLMIUM LASER APPLICATION Right 01/03/2017   Procedure: HOLMIUM LASER APPLICATION;  Surgeon: Malen Gauze, MD;  Location: [facility];  Service: Urology;  Laterality: Right;   TOTAL KNEE ARTHROPLASTY Right 01/20/2018   Procedure: RIGHT TOTAL KNEE ARTHROPLASTY;  Surgeon: Ollen Gross, MD;  Location: [facility];  Service: Orthopedics;  Laterality: Right;  with block    I have reviewed the social history and family history with the patient and they are unchanged from previous note.  ALLERGIES:  is allergic to lipitor [atorvastatin].  MEDICATIONS:  Current Outpatient Medications  Medication Sig Dispense Refill   acetaminophen (TYLENOL) 500 MG tablet Take 1,000 mg by mouth every 6 (six) hours as needed for mild pain.     amLODipine (NORVASC) 10 MG tablet Take 10 mg by mouth daily.      chlorthalidone (HYGROTON) 25 MG tablet Take 0.5 tablets by mouth daily.      ezetimibe (ZETIA) 10 MG tablet Take 10 mg by mouth daily.     ibuprofen (ADVIL) 600 MG tablet Take 1 tablet (600 mg total) by mouth every 8 (eight) hours as needed. 30 tablet 0   irbesartan (AVAPRO) 300 MG tablet Take 1 tablet by mouth daily.   1   montelukast (SINGULAIR) 10 MG tablet Take 1 tablet by mouth at bedtime.     Multiple Vitamin (MULTIVITAMIN) tablet Take 1 tablet by mouth daily.      simvastatin (ZOCOR) 40 MG tablet Take 20 mg by mouth 2 (two) times daily.      No current facility-administered medications for this visit.    PHYSICAL EXAMINATION: ECOG PERFORMANCE STATUS: 1 - Symptomatic but completely ambulatory  Vitals:   09/15/21 0754  BP: (!) 157/89  Pulse: 97  Resp: 20  Temp: 98.5 F (36.9 C)  SpO2: 98%   Wt Readings from Last 3 Encounters:  09/15/21 194 lb 6.4 oz (88.2 kg)  09/12/21 185 lb (83.9 kg)  09/04/21 195 lb 7 oz (88.6 kg)     GENERAL:alert, no distress and comfortable SKIN: skin color normal, no rashes or significant lesions EYES: normal, Conjunctiva are pink and non-injected, sclera clear  NEURO: alert & oriented x 3 with fluent speech  LABORATORY DATA:  I have reviewed the data as listed    Latest Ref Rng & Units 09/04/2021    2:09 PM 12/30/2018    5:51 AM 01/22/2018    5:16 AM  CBC  WBC 4.0 - 10.5  K/uL 5.6   4.9   9.6    Hemoglobin 12.0 - 15.0 g/dL 45.4   09.8   11.9    Hematocrit 36.0 - 46.0 % 38.5   41.5   34.0    Platelets 150 - 400 K/uL 162   158   176          Latest Ref Rng & Units 09/04/2021    2:09 PM 08/25/2021   10:29 AM 12/30/2018    5:51 AM  CMP  Glucose 70 - 99 mg/dL 95   147   829    BUN 8 - 23 mg/dL 15   13   16     Creatinine 0.44 - 1.00 mg/dL 5.62   1.30   8.65    Sodium 135 - 145 mmol/L 139   137   140    Potassium 3.5 - 5.1 mmol/L 3.7   3.4   3.4    Chloride 98 - 111 mmol/L 101   99   105    CO2 22 - 32 mmol/L 30   29   23     Calcium 8.9 - 10.3 mg/dL 78.4   69.6   9.6    Total Protein 6.5 - 8.1 g/dL 7.7      Total Bilirubin 0.3 - 1.2 mg/dL 0.9      Alkaline Phos 38 - 126 U/L 63      AST 15 - 41 U/L 15      ALT 0 - 44 U/L 15          RADIOGRAPHIC STUDIES: I have personally reviewed the radiological images as listed and agreed with the findings in the report. No results found.    Orders Placed This Encounter  Procedures   CBC with Differential/Platelet    Standing Status:   Standing    Number of  Occurrences:   50    Standing Expiration Date:   09/16/2022   Cancer antigen 19-9    Standing Status:   Standing    Number of Occurrences:   20    Standing Expiration Date:   09/16/2022   Comprehensive metabolic panel    Standing Status:   Standing    Number of Occurrences:   50    Standing Expiration Date:   09/16/2022   All questions were answered. The patient knows to call the clinic with any problems, questions or concerns. No barriers to learning was detected. The total time spent in the appointment was 45 minutes.     Malachy Mood, MD 09/15/2021   I, Mickie Bail, am acting as scribe for Malachy Mood, MD.   I have reviewed the above documentation for accuracy and completeness, and I agree with the above.

## 2021-09-15 NOTE — Progress Notes (Signed)
START ON PATHWAY REGIMEN - Pancreatic Adenocarcinoma ? ? ?  A cycle is every 28 days: ?    Nab-paclitaxel (protein bound)  ?    Gemcitabine  ? ?**Always confirm dose/schedule in your pharmacy ordering system** ? ?Patient Characteristics: ?Metastatic Disease, First Line, PS  ?  2, BRCA1/2 and PALB2 Mutation Absent/Unknown ?Therapeutic Status: Metastatic Disease ?Line of Therapy: First Line ?ECOG Performance Status: 2 ?BRCA1/2 Mutation Status: Awaiting Test Results ?PALB2 Mutation Status: Awaiting Test Results ?Intent of Therapy: ?Non-Curative / Palliative Intent, Discussed with Patient ?

## 2021-09-15 NOTE — Progress Notes (Signed)
I met with Kathryn Kent and her nieces, Juliene Pina and Cherene Julian.  We discussed Dr Ernestina Penna purposed treatment plan. I briefly explained insertion and care of a port a cath.  I showed a sample of the port a cath.  I reviewed our transportation services. They will let me know if they want to use that service.  All questions were answered.  They verbalized understanding. ? ? ?

## 2021-09-18 ENCOUNTER — Other Ambulatory Visit: Payer: Self-pay

## 2021-09-18 ENCOUNTER — Inpatient Hospital Stay (HOSPITAL_BASED_OUTPATIENT_CLINIC_OR_DEPARTMENT_OTHER): Payer: Medicare Other | Admitting: Licensed Clinical Social Worker

## 2021-09-18 ENCOUNTER — Inpatient Hospital Stay: Payer: Medicare Other

## 2021-09-18 DIAGNOSIS — Z8042 Family history of malignant neoplasm of prostate: Secondary | ICD-10-CM | POA: Diagnosis not present

## 2021-09-18 DIAGNOSIS — C252 Malignant neoplasm of tail of pancreas: Secondary | ICD-10-CM | POA: Diagnosis not present

## 2021-09-18 DIAGNOSIS — Z803 Family history of malignant neoplasm of breast: Secondary | ICD-10-CM

## 2021-09-18 LAB — CBC WITH DIFFERENTIAL/PLATELET
Abs Immature Granulocytes: 0.01 10*3/uL (ref 0.00–0.07)
Basophils Absolute: 0 10*3/uL (ref 0.0–0.1)
Basophils Relative: 1 %
Eosinophils Absolute: 0.1 10*3/uL (ref 0.0–0.5)
Eosinophils Relative: 1 %
HCT: 38 % (ref 36.0–46.0)
Hemoglobin: 12.6 g/dL (ref 12.0–15.0)
Immature Granulocytes: 0 %
Lymphocytes Relative: 22 %
Lymphs Abs: 1.1 10*3/uL (ref 0.7–4.0)
MCH: 29.2 pg (ref 26.0–34.0)
MCHC: 33.2 g/dL (ref 30.0–36.0)
MCV: 88.2 fL (ref 80.0–100.0)
Monocytes Absolute: 0.4 10*3/uL (ref 0.1–1.0)
Monocytes Relative: 8 %
Neutro Abs: 3.6 10*3/uL (ref 1.7–7.7)
Neutrophils Relative %: 68 %
Platelets: 151 10*3/uL (ref 150–400)
RBC: 4.31 MIL/uL (ref 3.87–5.11)
RDW: 13.6 % (ref 11.5–15.5)
WBC: 5.2 10*3/uL (ref 4.0–10.5)
nRBC: 0 % (ref 0.0–0.2)

## 2021-09-18 LAB — COMPREHENSIVE METABOLIC PANEL
ALT: 14 U/L (ref 0–44)
AST: 16 U/L (ref 15–41)
Albumin: 4.4 g/dL (ref 3.5–5.0)
Alkaline Phosphatase: 71 U/L (ref 38–126)
Anion gap: 8 (ref 5–15)
BUN: 11 mg/dL (ref 8–23)
CO2: 29 mmol/L (ref 22–32)
Calcium: 10.3 mg/dL (ref 8.9–10.3)
Chloride: 100 mmol/L (ref 98–111)
Creatinine, Ser: 0.74 mg/dL (ref 0.44–1.00)
GFR, Estimated: >60 mL/min (ref >60)
Glucose, Bld: 120 mg/dL — ABNORMAL HIGH (ref 70–99)
Potassium: 3.5 mmol/L (ref 3.5–5.1)
Sodium: 137 mmol/L (ref 135–145)
Total Bilirubin: 1 mg/dL (ref 0.3–1.2)
Total Protein: 8.1 g/dL (ref 6.5–8.1)

## 2021-09-18 LAB — GENETIC SCREENING ORDER

## 2021-09-18 NOTE — Progress Notes (Signed)
Pharmacist Chemotherapy Monitoring - Initial Assessment   ? ?Anticipated start date: 09/25/21  ? ?The following has been reviewed per standard work regarding the patient's treatment regimen: ?The patient's diagnosis, treatment plan and drug doses, and organ/hematologic function ?Lab orders and baseline tests specific to treatment regimen  ?The treatment plan start date, drug sequencing, and pre-medications ?Prior authorization status  ?Patient's documented medication list, including drug-drug interaction screen and prescriptions for anti-emetics and supportive care specific to the treatment regimen ?The drug concentrations, fluid compatibility, administration routes, and timing of the medications to be used ?The patient's access for treatment and lifetime cumulative dose history, if applicable  ?The patient's medication allergies and previous infusion related reactions, if applicable  ? ?Changes made to treatment plan:  ?treatment plan date ? ?Follow up needed:  ?Pending authorization for treatment  ? ? ?Kennith Center, Pharm.D., CPP ?09/18/2021'@2'$ :38 PM ? ? ? ?

## 2021-09-18 NOTE — Progress Notes (Signed)
REFERRING PROVIDER: ?Truitt Merle, MD ?[address] ?[place],  [place] [address] ? ?PRIMARY PROVIDER:  ?Crist Infante, MD ? ?PRIMARY REASON FOR VISIT:  ?1. Malignant neoplasm of tail of pancreas ([place])   ? ? ? ?HISTORY OF PRESENT ILLNESS:   ?Kathryn Kent, a 86 y.o. female, was seen for a [place] cancer genetics consultation at the request of Dr. Burr Medico due to a recent diagnosis of pancreatic cancer  Kathryn Kent presents to clinic today to discuss the possibility of a hereditary predisposition to cancer, genetic testing, and to further clarify her future cancer risks, as well as potential cancer risks for family members.  ? ?In 2023, at the age of 67, Kathryn Kent was diagnosed with pancreatic cancer. The treatment plan includes chemotherapy. ? ?CANCER HISTORY:  ?Oncology History  ?Pancreatic cancer [facility])  ?08/30/2021 Imaging  ? EXAM: ?CT ABDOMEN AND PELVIS WITH CONTRAST ? ?IMPRESSION: ?1. Infiltrative mass in the pancreatic tail which measures 4.7 x 4.4 ?cm, highly suspicious for pancreatic adenocarcinoma. ?2. The mass extends into the splenic hilum with findings suspicious ?for involvement of the splenic parenchyma as well as abutment with possible invasion of the colonic splenic flexure. ?3. Extensive omental and peritoneal nodularity with trace ?abdominopelvic free fluid, most consistent with carcinomatosis. ?4. Right cardiophrenic adenopathy and right sided pulmonary nodules measuring up to 11 mm, concerning for metastatic disease. ?5. Central filling defect within an enlarged left common femoral ?vein, consistent with nonocclusive deep venous thrombosis. ?6. Symmetric wall thickening of the distal esophagus with reflux ?versus retract retained contrast in the distal esophagus. Correlate ?for esophagitis. ?7. Hypodense hepatic lesions measure up to 1 cm in the right lower lobe appear to have been present on prior non contrasted CT and likely reflect cysts. No new discrete hepatic lesion identified.  ?8. Lobular uterine  contour with numerous dystrophic calcifications ?likely reflecting uterine leiomyomas. ?9.  Aortic Atherosclerosis (ICD10-I70.0). ?  ?09/06/2021 Imaging  ? EXAM: ?CT CHEST WITH CONTRAST ? ?IMPRESSION: ?1. Fissural nodularity and fissural thickening throughout the right ?lung concerning for metastatic disease. ?  ?2. Additionally there are bilateral pulmonary nodules in the lower ?lungs, right greater than left, measuring up to 11 mm suspicious for ?metastatic disease. ?  ?3.  Small right pleural effusion. ?  ?4. Enlarged right cardiophrenic lymph nodes suspicious for ?metastatic disease. ?  ?5. Subcentimeter incidental left thyroid nodule with heterogeneous ?and enlarged thyroid. Recommend thyroid US. Reference: J Am Coll Radiol. 2015 Feb;12(2): 143-50 ?  ?4. Pancreatic mass, peritoneal metastases and hypodense liver ?lesions are unchanged from 08/30/2021. ?  ?09/12/2021 Initial Biopsy  ? FINAL MICROSCOPIC DIAGNOSIS:  ? ?A.   OMENTAL MASS, NEEDLE CORE BIOPSY:  ?-    Metastatic adenocarcinoma, see Comment.  ? ?COMMENT:  ?The adenocarcinoma was interrogated with immunohistochemical (IHC) stains (CK7, CK20, PAX8, CDX-2, GATA-3). The malignant cells are CK7 positive, CK20 negative and have weak CDX-2 reactivity. The tumor cells are negative for GATA-3 (breast marker) and have weak/equivocal nuclear reactivity with PAX8.  This immunophenotype is non-specific, but is compatible with a pancreatic ductal primary, which is the favored primary site based on imaging studies. The controls are satisfactory.  ?  ?09/15/2021 Initial Diagnosis  ? Pancreatic cancer [facility]) ?  ?09/22/2021 -  Chemotherapy  ? Patient is on Treatment Plan : PANCREATIC Abraxane / Gemcitabine D1,8, q21d  ?   ? ? ? ?RISK FACTORS:  ?Menarche was at age 78.  ?OCP use for approximately 0 years.  ?Ovaries intact: yes.  ?Hysterectomy: no.  ?  Menopausal status: postmenopausal.  ?HRT use: 0 years. ?Colonoscopy: yes; normal. ?Mammogram within the last year: yes. ? ?Past  Medical History:  ?Diagnosis Date  ? Anemia   ? Coronary artery disease cardiologist---dr berry  ? cardiac cath --   10/16/05 revealing noncritical CAD  ? Cyst of left kidney   ? small  ? Diverticulosis   ? Heart murmur   ? History of colon polyps   ? History of kidney stones   ? Hyperlipidemia   ? Hypertension   ? Leiomyoma of uterus   ? Liver cyst   ? small  ? OSA on CPAP   ? Osteoarthritis   ? Periumbilical hernia   ? Moderate  ? PMB (postmenopausal bleeding)   ? Vulvar cyst   ? Wears dentures   ? upper  ? ? ?Past Surgical History:  ?Procedure Laterality Date  ? APPENDECTOMY  age 15  ? and removal cyst  ? CARDIAC CATHETERIZATION  10-16-2005   dr berry  ? nonobstructive cad w/ mid segmental LAD 30-40% ,  normal LVF  ? COLONOSCOPY  06/24/2017  ? CYSTOSCOPY W/ URETERAL STENT PLACEMENT Right 01/03/2017  ? Procedure: CYSTOSCOPY WITH RETROGRADE PYELOGRAM/ URETEROSCOPY/ STONE BASKETRY/URETERAL STENT PLACEMENT;  Surgeon: Cleon Gustin, MD;  [facility];  Service: Urology;  Laterality: Right;  ? DILATATION & CURETTAGE/HYSTEROSCOPY WITH MYOSURE N/A 12/30/2018  ? Procedure: [place];  Surgeon: Megan Salon, MD;  Location: [facility];  Service: Gynecology;  Laterality: N/A;  ? FOOT ARTHRODESIS Bilateral 1987  ? great toe's  ? great toe surgery Bilateral   ? HOLMIUM LASER APPLICATION Right 02/21/9406  ? Procedure: HOLMIUM LASER APPLICATION;  Surgeon: Cleon Gustin, MD;  Location: [facility];  Service: Urology;  Laterality: Right;  ? TOTAL KNEE ARTHROPLASTY Right 01/20/2018  ? Procedure: RIGHT TOTAL KNEE ARTHROPLASTY;  Surgeon: Gaynelle Arabian, MD;  Location: [facility];  Service: Orthopedics;  Laterality: Right;  with block  ? ? ?Social History  ? ?Socioeconomic History  ? Marital status: Widowed  ?  Spouse name: Not on file  ? Number of children: 0  ? Years of education: Not on file  ? Highest education level: Not on file   ?Occupational History  ? Occupation: retired  ?Tobacco Use  ? Smoking status: Never  ? Smokeless tobacco: Never  ?Vaping Use  ? Vaping Use: Never used  ?Substance and Sexual Activity  ? Alcohol use: Not Currently  ? Drug use: Never  ? Sexual activity: Not Currently  ?  Birth control/protection: Post-menopausal  ?Other Topics Concern  ? Not on file  ?Social History Narrative  ? Not on file  ? ?Social Determinants of Health  ? ?Financial Resource Strain: Not on file  ?Food Insecurity: Not on file  ?Transportation Needs: Not on file  ?Physical Activity: Not on file  ?Stress: Not on file  ?Social Connections: Not on file  ?  ? ?FAMILY HISTORY:  ?We obtained a detailed, 4-generation family history.  Significant diagnoses are listed below: ?Family History  ?Problem Relation Age of Onset  ? Alcohol abuse Mother   ? Prostate cancer Father   ?     ?  ? Stroke Sister   ? Breast cancer Sister 11  ? Aneurysm Brother   ?     heart  ? Lung cancer Brother   ? Colon cancer Neg Hx   ? ?Kathryn Kent had 2 sisters and 3 brothers. One sister  had uterine cancer in her 17s, breast at 30, and is living at 19. ? ?Kathryn Kent's mother died at 36. Patient had 1 aunt who had leukemia, her son also had leukemia. No other known cancers on this side of the family. ? ?Kathryn Kent's father had prostate or stomach cancer at died at 1, it was metastatic. Patient had 1 uncle who had bone cancer and a cousin who had colon cancer in his 48s, no other known cancers on this side of the family.  ? ?Kathryn Kent is unaware of previous family history of genetic testing for hereditary cancer risks. There is no reported Ashkenazi Jewish ancestry. There is no known consanguinity. ? ? ?GENETIC COUNSELING ASSESSMENT: Kathryn Kent is a 86 y.o. female with a personal and family history of cancer which is somewhat suggestive of a hereditary cancer syndrome and predisposition to cancer. We, therefore, discussed and recommended the following at today's visit.  ? ?DISCUSSION: We  discussed that approximately 10% of pancreatic cancer is hereditary. Most cases of hereditary pancreatic cancer are associated with BRCA1/2 genes, which also increase risk for breast and prostate cancer. There

## 2021-09-19 ENCOUNTER — Inpatient Hospital Stay: Payer: Medicare Other

## 2021-09-19 LAB — CANCER ANTIGEN 19-9: CA 19-9: 1527 U/mL — ABNORMAL HIGH (ref 0–35)

## 2021-09-20 ENCOUNTER — Other Ambulatory Visit: Payer: Self-pay | Admitting: Internal Medicine

## 2021-09-20 ENCOUNTER — Other Ambulatory Visit: Payer: Self-pay | Admitting: Radiology

## 2021-09-22 ENCOUNTER — Encounter: Payer: Self-pay | Admitting: Hematology

## 2021-09-22 ENCOUNTER — Encounter (HOSPITAL_COMMUNITY): Payer: Self-pay

## 2021-09-22 ENCOUNTER — Ambulatory Visit (HOSPITAL_COMMUNITY)
Admission: RE | Admit: 2021-09-22 | Discharge: 2021-09-22 | Disposition: A | Discharge status: Home / Self Care | Payer: Medicare Other | Source: Ambulatory Visit | Attending: Hematology | Admitting: Hematology

## 2021-09-22 ENCOUNTER — Other Ambulatory Visit: Payer: Self-pay

## 2021-09-22 DIAGNOSIS — Z79899 Other long term (current) drug therapy: Secondary | ICD-10-CM | POA: Insufficient documentation

## 2021-09-22 DIAGNOSIS — C252 Malignant neoplasm of tail of pancreas: Secondary | ICD-10-CM | POA: Diagnosis present

## 2021-09-22 HISTORY — PX: IR IMAGING GUIDED PORT INSERTION: IMG5740

## 2021-09-22 LAB — PROTIME-INR
INR: 1.1 (ref 0.8–1.2)
Prothrombin Time: 13.7 seconds (ref 11.4–15.2)

## 2021-09-22 LAB — CBC
HCT: 35.9 % — ABNORMAL LOW (ref 36.0–46.0)
Hemoglobin: 12 g/dL (ref 12.0–15.0)
MCH: 29.5 pg (ref 26.0–34.0)
MCHC: 33.4 g/dL (ref 30.0–36.0)
MCV: 88.2 fL (ref 80.0–100.0)
Platelets: 158 10*3/uL (ref 150–400)
RBC: 4.07 MIL/uL (ref 3.87–5.11)
RDW: 13.4 % (ref 11.5–15.5)
WBC: 4.7 10*3/uL (ref 4.0–10.5)
nRBC: 0 % (ref 0.0–0.2)

## 2021-09-22 MED ORDER — SODIUM CHLORIDE 0.9 % IV SOLN: INTRAVENOUS | Status: DC

## 2021-09-22 MED ORDER — FENTANYL CITRATE (PF) 100 MCG/2ML IJ SOLN
INTRAMUSCULAR | Status: AC
  Filled 2021-09-22: qty 2

## 2021-09-22 MED ORDER — LIDOCAINE-EPINEPHRINE 1 %-1:100000 IJ SOLN
INTRAMUSCULAR | Status: AC | PRN
  Administered 2021-09-22: 20 mL

## 2021-09-22 MED ORDER — LIDOCAINE-EPINEPHRINE 1 %-1:100000 IJ SOLN
INTRAMUSCULAR | Status: AC
  Filled 2021-09-22: qty 1

## 2021-09-22 MED ORDER — MIDAZOLAM HCL 2 MG/2ML IJ SOLN
INTRAMUSCULAR | Status: AC | PRN
  Administered 2021-09-22 (×2): 1 mg via INTRAVENOUS

## 2021-09-22 MED ORDER — HEPARIN SOD (PORK) LOCK FLUSH 100 UNIT/ML IV SOLN
INTRAVENOUS | Status: AC
  Filled 2021-09-22: qty 5

## 2021-09-22 MED ORDER — MIDAZOLAM HCL 2 MG/2ML IJ SOLN
INTRAMUSCULAR | Status: AC
  Filled 2021-09-22: qty 4

## 2021-09-22 MED ORDER — FENTANYL CITRATE (PF) 100 MCG/2ML IJ SOLN
INTRAMUSCULAR | Status: AC | PRN
  Administered 2021-09-22 (×2): 50 ug via INTRAVENOUS

## 2021-09-22 NOTE — Procedures (Signed)
Interventional Radiology Procedure Note ? ?Procedure: Port placement. ? ?Indication: Pancreat Ca ? ?Findings: Please refer to procedural dictation for full description. ? ?Complications: None ? ?EBL: < 10 mL ? ?Miachel Roux, MD ?[phone] ? ? ?

## 2021-09-22 NOTE — Progress Notes (Signed)
Patient's niece Cherene Julian called to obtain information regarding Advertising account executive. ? ?Discussed details and qualifications. Advised what is needed for patient to apply. She will bring on 4/3 to have copy sent to me and complete paperwork. ? ?She has my card for any additional financial questions or concerns. ?

## 2021-09-22 NOTE — Discharge Instructions (Signed)
Interventional radiology phone numbers °[phone] °After hours [phone] ° ° ° °You have skin glue (dermabond) over your new port. Do not use the lidocaine cream (EMLA cream) over the skin glue until it has healed. The petroleum in the lidocaine cream will dissolve the skin glue resulting in an infection of your new port. Use ice in a zip lock bag for 1-2 minutes over your new port before the cancer center nurses access your port. ° ° °Implanted Port Insertion, Care After °This sheet gives you information about how to care for yourself after your procedure. Your health care provider may also give you more specific instructions. If you have problems or questions, contact your health care provider. °What can I expect after the procedure? °After the procedure, it is common to have: °Discomfort at the port insertion site. °Bruising on the skin over the port. This should improve over 3-4 days. °Follow these instructions at home: °Port care °After your port is placed, you will get a manufacturer's information card. The card has information about your port. Keep this card with you at all times. °Take care of the port as told by your health care provider. Ask your health care provider if you or a family member can get training for taking care of the port at home. A home health care nurse may also take care of the port. °Make sure to remember what type of port you have. °Incision care °Follow instructions from your health care provider about how to take care of your port insertion site. Make sure you: °Wash your hands with soap and water before and after you change your bandage (dressing). If soap and water are not available, use hand sanitizer. °Change your dressing as told by your health care provider. °Leave skin glue in place. These skin closures may need to stay in place for 2 weeks or longer.  °Check your port insertion site every day for signs of infection. Check for: °Redness, swelling, or pain. °Fluid or  blood. °Warmth. °Pus or a bad smell.  °  °  °Activity °Return to your normal activities as told by your health care provider. Ask your health care provider what activities are safe for you. °Do not lift anything that is heavier than 10 lb (4.5 kg), or the limit that you are told, until your health care provider says that it is safe. °General instructions °Take over-the-counter and prescription medicines only as told by your health care provider. °Do not take baths, swim, or use a hot tub until your health care provider approves.You may remove your dressing tomorrow and shower 24 hours after your procedure. °Do not drive for 24 hours if you were given a sedative during your procedure. °Wear a medical alert bracelet in case of an emergency. This will tell any health care providers that you have a port. °Keep all follow-up visits as told by your health care provider. This is important. °Contact a health care provider if: °You cannot flush your port with saline as directed, or you cannot draw blood from the port. °You have a fever or chills. °You have redness, swelling, or pain around your port insertion site. °You have fluid or blood coming from your port insertion site. °Your port insertion site feels warm to the touch. °You have pus or a bad smell coming from the port insertion site. °Get help right away if: °You have chest pain or shortness of breath. °You have bleeding from your port that you cannot control. °Summary °Take care of   the port as told by your health care provider. Keep the manufacturer's information card with you at all times. °Change your dressing as told by your health care provider. °Contact a health care provider if you have a fever or chills or if you have redness, swelling, or pain around your port insertion site. °Keep all follow-up visits as told by your health care provider. °This information is not intended to replace advice given to you by your health care provider. Make sure you discuss any  questions you have with your health care provider. °Document Revised: 01/07/2018 Document Reviewed: 01/07/2018 °Elsevier Patient Education © 2021 [employer] ° ° ° °Moderate Conscious Sedation, Adult, Care After °This sheet gives you information about how to care for yourself after your procedure. Your health care provider may also give you more specific instructions. If you have problems or questions, contact your health care provider. °What can I expect after the procedure? °After the procedure, it is common to have: °Sleepiness for several hours. °Impaired judgment for several hours. °Difficulty with balance. °Vomiting if you eat too soon. °Follow these instructions at home: °For the time period you were told by your health care provider: °Rest. °Do not participate in activities where you could fall or become injured. °Do not drive or use machinery. °Do not drink alcohol. °Do not take sleeping pills or medicines that cause drowsiness. °Do not make important decisions or sign legal documents. °Do not take care of children on your own.  °  °  °Eating and drinking °Follow the diet recommended by your health care provider. °Drink enough fluid to keep your urine pale yellow. °If you vomit: °Drink water, juice, or soup when you can drink without vomiting. °Make sure you have little or no nausea before eating solid foods.   °General instructions °Take over-the-counter and prescription medicines only as told by your health care provider. °Have a responsible adult stay with you for the time you are told. It is important to have someone help care for you until you are awake and alert. °Do not smoke. °Keep all follow-up visits as told by your health care provider. This is important. °Contact a health care provider if: °You are still sleepy or having trouble with balance after 24 hours. °You feel light-headed. °You keep feeling nauseous or you keep vomiting. °You develop a rash. °You have a fever. °You have redness or  swelling around the IV site. °Get help right away if: °You have trouble breathing. °You have new-onset confusion at home. °Summary °After the procedure, it is common to feel sleepy, have impaired judgment, or feel nauseous if you eat too soon. °Rest after you get home. Know the things you should not do after the procedure. °Follow the diet recommended by your health care provider and drink enough fluid to keep your urine pale yellow. °Get help right away if you have trouble breathing or new-onset confusion at home. °This information is not intended to replace advice given to you by your health care provider. Make sure you discuss any questions you have with your health care provider. °Document Revised: 10/09/2019 Document Reviewed: 05/07/2019 °Elsevier Patient Education © 2021 Elsevier Inc.  °

## 2021-09-22 NOTE — H&P (Signed)
? ? ?Referring Physician(s): ?Feng,Yan ? ?Supervising Physician: Mir, Sharen Heck ? ?Patient Status:  [facility] ? ?Chief Complaint: ? ?" I'm getting a Port-A-Cath" ? ?Subjective: ?Patient familiar to IR service from omental mass biopsy on 09/12/2021.  She has a history of newly diagnosed metastatic pancreatic carcinoma and presents today for Port-A-Cath placement to assist with palliative chemotherapy.  She currently denies fever, headache, chest pain, dyspnea, cough, vomiting or abnormal bleeding.  She does have some occasional abdominal and back discomfort as well as intermittent nausea.  Additional medical history as below. ? ?Past Medical History:  ?Diagnosis Date  ? Anemia   ? Coronary artery disease cardiologist---dr berry  ? cardiac cath --   10/16/05 revealing noncritical CAD  ? Cyst of left kidney   ? small  ? Diverticulosis   ? Heart murmur   ? History of colon polyps   ? History of kidney stones   ? Hyperlipidemia   ? Hypertension   ? Leiomyoma of uterus   ? Liver cyst   ? small  ? OSA on CPAP   ? Osteoarthritis   ? Periumbilical hernia   ? Moderate  ? PMB (postmenopausal bleeding)   ? Vulvar cyst   ? Wears dentures   ? upper  ? ?Past Surgical History:  ?Procedure Laterality Date  ? APPENDECTOMY  age 28  ? and removal cyst  ? CARDIAC CATHETERIZATION  10-16-2005   dr berry  ? nonobstructive cad w/ mid segmental LAD 30-40% ,  normal LVF  ? COLONOSCOPY  06/24/2017  ? CYSTOSCOPY W/ URETERAL STENT PLACEMENT Right 01/03/2017  ? Procedure: CYSTOSCOPY WITH RETROGRADE PYELOGRAM/ URETEROSCOPY/ STONE BASKETRY/URETERAL STENT PLACEMENT;  Surgeon: Cleon Gustin, MD;  Location: [facility];  Service: Urology;  Laterality: Right;  ? DILATATION & CURETTAGE/HYSTEROSCOPY WITH MYOSURE N/A 12/30/2018  ? Procedure: [place];  Surgeon: Megan Salon, MD;  Location: [facility];  Service: Gynecology;  Laterality: N/A;  ? FOOT ARTHRODESIS Bilateral 1987  ? great  toe's  ? great toe surgery Bilateral   ? HOLMIUM LASER APPLICATION Right 5/63/8937  ? Procedure: HOLMIUM LASER APPLICATION;  Surgeon: Cleon Gustin, MD;  Location: [facility];  Service: Urology;  Laterality: Right;  ? TOTAL KNEE ARTHROPLASTY Right 01/20/2018  ? Procedure: RIGHT TOTAL KNEE ARTHROPLASTY;  Surgeon: Gaynelle Arabian, MD;  Location: [facility];  Service: Orthopedics;  Laterality: Right;  with block  ? ? ? ? ?Allergies: ?Lipitor [atorvastatin] ? ?Medications: ?Prior to Admission medications   ?Medication Sig Start Date End Date Taking? Authorizing Provider  ?acetaminophen (TYLENOL) 500 MG tablet Take 1,000 mg by mouth every 6 (six) hours as needed for mild pain.    [provider]  ?amLODipine (NORVASC) 10 MG tablet Take 10 mg by mouth daily.     [provider]  ?chlorthalidone (HYGROTON) 25 MG tablet Take 0.5 tablets by mouth daily.  03/12/16   [provider]  ?ezetimibe (ZETIA) 10 MG tablet Take 10 mg by mouth daily. 10/17/20   [provider]  ?ibuprofen (ADVIL) 600 MG tablet Take 1 tablet (600 mg total) by mouth every 8 (eight) hours as needed. 12/30/18   Megan Salon, MD  ?irbesartan (AVAPRO) 300 MG tablet Take 1 tablet by mouth daily.  10/16/17   [provider]  ?lidocaine-prilocaine (EMLA) cream Apply to affected area once 09/15/21   Truitt Merle, MD  ?montelukast (SINGULAIR) 10 MG tablet Take 1 tablet by mouth at bedtime.  12/09/18   [provider]  ?Multiple Vitamin (MULTIVITAMIN) tablet Take 1 tablet by mouth daily.    [provider]  ?ondansetron (ZOFRAN) 8 MG tablet Take 1 tablet (8 mg total) by mouth 2 (two) times daily as needed (Nausea or vomiting). 09/15/21   Truitt Merle, MD  ?prochlorperazine (COMPAZINE) 10 MG tablet Take 1 tablet (10 mg total) by mouth every 6 (six) hours as needed (Nausea or vomiting). 09/15/21   Truitt Merle, MD  ?simvastatin (ZOCOR) 40 MG tablet Take 20 mg by mouth 2 (two) times daily.     [provider]  ? ? ? ?Vital Signs:pending ? ? ?Physical Exam awake, alert.  Chest clear to auscultation bilaterally.  Heart with normal rate, occasional ectopy noted.  Soft murmur.  Abdomen soft, positive bowel sounds, currently nontender.  No lower extremity edema. ? ?Imaging: ?No results found. ? ?Labs: ? ?CBC: ?Recent Labs  ?  09/04/21 ?1409 09/18/21 ?1105 09/22/21 ?1300  ?WBC 5.6 5.2 4.7  ?HGB 12.9 12.6 12.0  ?HCT 38.5 38.0 35.9*  ?PLT 162 151 158  ? ? ?COAGS: ?No results for input(s): INR, APTT in the last 8760 hours. ? ?BMP: ?Recent Labs  ?  08/25/21 ?1029 09/04/21 ?1409 09/18/21 ?1105  ?NA 137 139 137  ?K 3.4* 3.7 3.5  ?CL 99 101 100  ?CO2 '29 30 29  '$ ?GLUCOSE 119* 95 120*  ?BUN '13 15 11  '$ ?CALCIUM 10.2 10.2 10.3  ?CREATININE 0.80 0.75 0.74  ?GFRNONAA  --  >60 >60  ? ? ?LIVER FUNCTION TESTS: ?Recent Labs  ?  09/04/21 ?1409 09/18/21 ?1105  ?BILITOT 0.9 1.0  ?AST 15 16  ?ALT 15 14  ?ALKPHOS 63 71  ?PROT 7.7 8.1  ?ALBUMIN 4.4 4.4  ? ? ?Assessment and Plan: ?Patient familiar to IR service from omental mass biopsy on 09/12/2021.  She has a history of newly diagnosed metastatic pancreatic carcinoma and presents today for Port-A-Cath placement to assist with palliative chemotherapy. Risks and benefits of image guided port-a-catheter placement was discussed with the patient including, but not limited to bleeding, infection, pneumothorax, or fibrin sheath development and need for additional procedures. ? ?All of the patient's questions were answered, patient is agreeable to proceed. ?Consent signed and in chart. ? ? ? ?Electronically Signed: ?Autumn Messing, PA-C ?09/22/2021, 1:07 PM ? ? ?I spent a total of 25 minutes at the the patient's bedside AND on the patient's hospital floor or unit, greater than 50% of which was counseling/coordinating care for Port-A-Cath placement ? ? ? ? ? ?

## 2021-09-25 ENCOUNTER — Inpatient Hospital Stay: Payer: Medicare Other

## 2021-09-25 ENCOUNTER — Other Ambulatory Visit: Payer: Self-pay

## 2021-09-25 ENCOUNTER — Inpatient Hospital Stay: Payer: Medicare Other | Attending: Physician Assistant | Admitting: Nutrition

## 2021-09-25 ENCOUNTER — Encounter: Payer: Self-pay | Admitting: Hematology

## 2021-09-25 VITALS — BP 108/64 | HR 79 | Temp 98.6°F | Resp 18 | Wt 186.5 lb

## 2021-09-25 DIAGNOSIS — I10 Essential (primary) hypertension: Secondary | ICD-10-CM | POA: Insufficient documentation

## 2021-09-25 DIAGNOSIS — C7802 Secondary malignant neoplasm of left lung: Secondary | ICD-10-CM | POA: Diagnosis not present

## 2021-09-25 DIAGNOSIS — E041 Nontoxic single thyroid nodule: Secondary | ICD-10-CM | POA: Diagnosis not present

## 2021-09-25 DIAGNOSIS — C786 Secondary malignant neoplasm of retroperitoneum and peritoneum: Secondary | ICD-10-CM | POA: Insufficient documentation

## 2021-09-25 DIAGNOSIS — Z5111 Encounter for antineoplastic chemotherapy: Secondary | ICD-10-CM | POA: Insufficient documentation

## 2021-09-25 DIAGNOSIS — C252 Malignant neoplasm of tail of pancreas: Secondary | ICD-10-CM

## 2021-09-25 DIAGNOSIS — R102 Pelvic and perineal pain: Secondary | ICD-10-CM | POA: Diagnosis not present

## 2021-09-25 DIAGNOSIS — K5900 Constipation, unspecified: Secondary | ICD-10-CM | POA: Diagnosis not present

## 2021-09-25 DIAGNOSIS — J90 Pleural effusion, not elsewhere classified: Secondary | ICD-10-CM | POA: Insufficient documentation

## 2021-09-25 DIAGNOSIS — C7801 Secondary malignant neoplasm of right lung: Secondary | ICD-10-CM | POA: Diagnosis not present

## 2021-09-25 DIAGNOSIS — Z95828 Presence of other vascular implants and grafts: Secondary | ICD-10-CM

## 2021-09-25 LAB — CBC WITH DIFFERENTIAL/PLATELET
Abs Immature Granulocytes: 0.01 10*3/uL (ref 0.00–0.07)
Basophils Absolute: 0 10*3/uL (ref 0.0–0.1)
Basophils Relative: 1 %
Eosinophils Absolute: 0.1 10*3/uL (ref 0.0–0.5)
Eosinophils Relative: 2 %
HCT: 36.9 % (ref 36.0–46.0)
Hemoglobin: 12.1 g/dL (ref 12.0–15.0)
Immature Granulocytes: 0 %
Lymphocytes Relative: 20 %
Lymphs Abs: 1.1 10*3/uL (ref 0.7–4.0)
MCH: 28.9 pg (ref 26.0–34.0)
MCHC: 32.8 g/dL (ref 30.0–36.0)
MCV: 88.1 fL (ref 80.0–100.0)
Monocytes Absolute: 0.5 10*3/uL (ref 0.1–1.0)
Monocytes Relative: 10 %
Neutro Abs: 3.6 10*3/uL (ref 1.7–7.7)
Neutrophils Relative %: 67 %
Platelets: 161 10*3/uL (ref 150–400)
RBC: 4.19 MIL/uL (ref 3.87–5.11)
RDW: 13.6 % (ref 11.5–15.5)
WBC: 5.3 10*3/uL (ref 4.0–10.5)
nRBC: 0 % (ref 0.0–0.2)

## 2021-09-25 LAB — COMPREHENSIVE METABOLIC PANEL
ALT: 16 U/L (ref 0–44)
AST: 16 U/L (ref 15–41)
Albumin: 4.2 g/dL (ref 3.5–5.0)
Alkaline Phosphatase: 67 U/L (ref 38–126)
Anion gap: 8 (ref 5–15)
BUN: 13 mg/dL (ref 8–23)
CO2: 30 mmol/L (ref 22–32)
Calcium: 10 mg/dL (ref 8.9–10.3)
Chloride: 101 mmol/L (ref 98–111)
Creatinine, Ser: 0.76 mg/dL (ref 0.44–1.00)
GFR, Estimated: >60 mL/min (ref >60)
Glucose, Bld: 128 mg/dL — ABNORMAL HIGH (ref 70–99)
Potassium: 3.4 mmol/L — ABNORMAL LOW (ref 3.5–5.1)
Sodium: 139 mmol/L (ref 135–145)
Total Bilirubin: 0.9 mg/dL (ref 0.3–1.2)
Total Protein: 7.8 g/dL (ref 6.5–8.1)

## 2021-09-25 MED ORDER — SODIUM CHLORIDE 0.9% FLUSH
10.0000 mL | INTRAVENOUS | Status: DC | PRN
  Administered 2021-09-25: 10 mL

## 2021-09-25 MED ORDER — HEPARIN SOD (PORK) LOCK FLUSH 100 UNIT/ML IV SOLN
500.0000 [IU] | Freq: Once | INTRAVENOUS | Status: AC | PRN
  Administered 2021-09-25: 500 [IU]

## 2021-09-25 MED ORDER — PROCHLORPERAZINE MALEATE 10 MG PO TABS
10.0000 mg | ORAL_TABLET | Freq: Once | ORAL | Status: AC
  Administered 2021-09-25: 10 mg via ORAL
  Filled 2021-09-25: qty 1

## 2021-09-25 MED ORDER — SODIUM CHLORIDE 0.9 % IV SOLN: Freq: Once | INTRAVENOUS | Status: AC

## 2021-09-25 MED ORDER — SODIUM CHLORIDE 0.9% FLUSH
10.0000 mL | INTRAVENOUS | Status: AC | PRN
  Administered 2021-09-25: 10 mL

## 2021-09-25 MED ORDER — SODIUM CHLORIDE 0.9 % IV SOLN
1000.0000 mg/m2 | Freq: Once | INTRAVENOUS | Status: AC
  Administered 2021-09-25: 2014 mg via INTRAVENOUS
  Filled 2021-09-25: qty 52.97

## 2021-09-25 NOTE — Progress Notes (Signed)
86 year old female diagnosed with metastatic pancreatic cancer with mets to peritoneum and lungs.  She is followed by Dr. Burr Medico.  She is receiving gemcitabine/Abraxane. ? ?Past medical history includes anemia, CAD, diverticulitis, hyperlipidemia, hypertension, and dentures. ? ?Medications include multivitamin, Compazine and Zofran. ? ?Labs noted: Potassium 3.4, glucose 128. ? ?Height: 5 feet 4 inches. ?Weight: 186 pounds 8 ounces. ?Usual body weight:  ?Patient weighed 220 pounds August 17, 2018.   ?Patient reports usual body weight is 160 pounds but she gained weight 4 years ago after knee replacement. ?BMI: 32.01. ? ?Patient's appetite is variable.  She reports some early satiety.  She has early morning nausea.  She is drinking 1-2 cartons of Ensure daily.  Reports she is pleased she is now starting to lose some weight. ? ?Nutrition diagnosis:  ?Unintended weight loss related to metastatic cancer and associated treatments as evidenced by 4% weight loss over 1 month. ? ?Intervention: ?Educated patient on the importance of weight maintenance throughout treatment. ?Recommended small amounts of food more often.  Encourage patient to consume oral nutrition supplements twice daily between meals. ?Provided nutrition fact sheets.  Questions were answered.  Teach back method used. ? ?Monitoring, evaluation, goals: ?Patient will tolerate increased calories and protein to minimize weight loss. ? ?Next visit: To be scheduled with upcoming treatment. ? ?**Disclaimer: This note was dictated with voice recognition software. Similar sounding words can inadvertently be transcribed and this note may contain transcription errors which may not have been corrected upon publication of note.** ? ?

## 2021-09-25 NOTE — Progress Notes (Signed)
Met with patient's niece at registration to complete grant process. ? ?Patient approved for one-time $1000 [employer] grant to assist with personal expenses while going through treatment. Discussed in details expenses and how they are covered. She has a copy of the approval letter and expense sheet along with the Outpatient pharmacy information in a green folder along with my card for any additional financial questions or concerns. ?

## 2021-09-25 NOTE — Patient Instructions (Signed)
[facility]  Discharge Instructions: ?Thank you for choosing [facility] to provide your oncology and hematology care.  ? ?If you have a lab appointment with the [facility], please go directly to the [facility] and check in at the registration area. ?  ?Wear comfortable clothing and clothing appropriate for easy access to any Portacath or PICC line.  ? ?We strive to give you quality time with your provider. You may need to reschedule your appointment if you arrive late (15 or more minutes).  Arriving late affects you and other patients whose appointments are after yours.  Also, if you miss three or more appointments without notifying the office, you may be dismissed from the clinic at the provider?s discretion.    ?  ?For prescription refill requests, have your pharmacy contact our office and allow 72 hours for refills to be completed.   ? ?Today you received the following chemotherapy and/or immunotherapy agents: Gemcitabine    ?  ?To help prevent nausea and vomiting after your treatment, we encourage you to take your nausea medication as directed. ? ?BELOW ARE SYMPTOMS THAT SHOULD BE REPORTED IMMEDIATELY: ?*FEVER GREATER THAN 100.4 F (38 ?C) OR HIGHER ?*CHILLS OR SWEATING ?*NAUSEA AND VOMITING THAT IS NOT CONTROLLED WITH YOUR NAUSEA MEDICATION ?*UNUSUAL SHORTNESS OF BREATH ?*UNUSUAL BRUISING OR BLEEDING ?*URINARY PROBLEMS (pain or burning when urinating, or frequent urination) ?*BOWEL PROBLEMS (unusual diarrhea, constipation, pain near the anus) ?TENDERNESS IN MOUTH AND THROAT WITH OR WITHOUT PRESENCE OF ULCERS (sore throat, sores in mouth, or a toothache) ?UNUSUAL RASH, SWELLING OR PAIN  ?UNUSUAL VAGINAL DISCHARGE OR ITCHING  ? ?Items with * indicate a potential emergency and should be followed up as soon as possible or go to the Emergency Department if any problems should occur. ? ?Please show the CHEMOTHERAPY ALERT CARD or IMMUNOTHERAPY ALERT CARD at check-in  to the Emergency Department and triage nurse. ? ?Should you have questions after your visit or need to cancel or reschedule your appointment, please contact [facility]  Dept: [phone]  and follow the prompts.  Office hours are 8:00 a.m. to 4:30 p.m. Monday - Friday. Please note that voicemails left after 4:00 p.m. may not be returned until the following business day.  We are closed weekends and major holidays. You have access to a nurse at all times for urgent questions. Please call the main number to the clinic Dept: [phone] and follow the prompts. ? ? ?For any non-urgent questions, you may also contact your provider using MyChart. We now offer e-Visits for anyone 16 and older to request care online for non-urgent symptoms. For details visit [email or web address]. ?  ?Also download the MyChart app! Go to the app store, search "MyChart", open the app, select [facility], and log in with your MyChart username and password. ? ?Due to Covid, a mask is required upon entering the hospital/clinic. If you do not have a mask, one will be given to you upon arrival. For doctor visits, patients may have 1 support person aged 65 or older with them. For treatment visits, patients cannot have anyone with them due to current Covid guidelines and our immunocompromised population.  ? ?Gemcitabine injection ?What is this medication? ?GEMCITABINE (jem SYE ta been) is a chemotherapy drug. This medicine is used to treat many types of cancer like breast cancer, lung cancer, pancreatic cancer, and ovarian cancer. ?This medicine may be used for other purposes; ask your health care provider or  pharmacist if you have questions. ?COMMON BRAND NAME(S): Gemzar, Infugem ?What should I tell my care team before I take this medication? ?They need to know if you have any of these conditions: ?blood disorders ?infection ?kidney disease ?liver disease ?lung or breathing disease, like asthma ?recent or ongoing  radiation therapy ?an unusual or allergic reaction to gemcitabine, other chemotherapy, other medicines, foods, dyes, or preservatives ?pregnant or trying to get pregnant ?breast-feeding ?How should I use this medication? ?This drug is given as an infusion into a vein. It is administered in a hospital or clinic by a specially trained health care professional. ?Talk to your pediatrician regarding the use of this medicine in children. Special care may be needed. ?Overdosage: If you think you have taken too much of this medicine contact a poison control center or emergency room at once. ?NOTE: This medicine is only for you. Do not share this medicine with others. ?What if I miss a dose? ?It is important not to miss your dose. Call your doctor or health care professional if you are unable to keep an appointment. ?What may interact with this medication? ?medicines to increase blood counts like filgrastim, pegfilgrastim, sargramostim ?some other chemotherapy drugs like cisplatin ?vaccines ?Talk to your doctor or health care professional before taking any of these medicines: ?acetaminophen ?aspirin ?ibuprofen ?ketoprofen ?naproxen ?This list may not describe all possible interactions. Give your health care provider a list of all the medicines, herbs, non-prescription drugs, or dietary supplements you use. Also tell them if you smoke, drink alcohol, or use illegal drugs. Some items may interact with your medicine. ?What should I watch for while using this medication? ?Visit your doctor for checks on your progress. This drug may make you feel generally unwell. This is not uncommon, as chemotherapy can affect healthy cells as well as cancer cells. Report any side effects. Continue your course of treatment even though you feel ill unless your doctor tells you to stop. ?In some cases, you may be given additional medicines to help with side effects. Follow all directions for their use. ?Call your doctor or health care  professional for advice if you get a fever, chills or sore throat, or other symptoms of a cold or flu. Do not treat yourself. This drug decreases your body's ability to fight infections. Try to avoid being around people who are sick. ?This medicine may increase your risk to bruise or bleed. Call your doctor or health care professional if you notice any unusual bleeding. ?Be careful brushing and flossing your teeth or using a toothpick because you may get an infection or bleed more easily. If you have any dental work done, tell your dentist you are receiving this medicine. ?Avoid taking products that contain aspirin, acetaminophen, ibuprofen, naproxen, or ketoprofen unless instructed by your doctor. These medicines may hide a fever. ?Do not become pregnant while taking this medicine or for 6 months after stopping it. Women should inform their doctor if they wish to become pregnant or think they might be pregnant. Men should not father a child while taking this medicine and for 3 months after stopping it. There is a potential for serious side effects to an unborn child. Talk to your health care professional or pharmacist for more information. Do not breast-feed an infant while taking this medicine or for at least 1 week after stopping it. ?Men should inform their doctors if they wish to father a child. This medicine may lower sperm counts. Talk with your doctor or health care  professional if you are concerned about your fertility. ?What side effects may I notice from receiving this medication? ?Side effects that you should report to your doctor or health care professional as soon as possible: ?allergic reactions like skin rash, itching or hives, swelling of the face, lips, or tongue ?breathing problems ?pain, redness, or irritation at site where injected ?signs and symptoms of a dangerous change in heartbeat or heart rhythm like chest pain; dizziness; fast or irregular heartbeat; palpitations; feeling faint or  lightheaded, falls; breathing problems ?signs of decreased platelets or bleeding - bruising, pinpoint red spots on the skin, black, tarry stools, blood in the urine ?signs of decreased red blood cells - unusually weak o

## 2021-09-26 ENCOUNTER — Telehealth: Payer: Self-pay | Admitting: *Deleted

## 2021-09-26 NOTE — Telephone Encounter (Signed)
Called pt to see how she did with her treatment & she reports doing well with no side effects.  She knows how to reach [place] with any concerns & knows her next appt.  ?

## 2021-09-26 NOTE — Telephone Encounter (Signed)
-----   Message from Derrick Ravel, RN sent at 09/25/2021 12:51 PM EDT ----- ?Regarding: First time gemzar infusion, Dr. Burr Medico patient. ?First time gemzar infusion, Dr. Burr Medico patient. She tolerated infusion well, please give follow up phone call. Thank you!! ? ?

## 2021-10-02 ENCOUNTER — Inpatient Hospital Stay: Payer: Medicare Other | Admitting: Hematology

## 2021-10-02 ENCOUNTER — Inpatient Hospital Stay: Payer: Medicare Other

## 2021-10-02 ENCOUNTER — Other Ambulatory Visit: Payer: Self-pay

## 2021-10-02 ENCOUNTER — Encounter: Payer: Self-pay | Admitting: Hematology

## 2021-10-02 ENCOUNTER — Telehealth: Payer: Self-pay | Admitting: Licensed Clinical Social Worker

## 2021-10-02 VITALS — BP 142/84 | HR 97 | Temp 98.4°F | Resp 18 | Ht 64.0 in | Wt 186.6 lb

## 2021-10-02 DIAGNOSIS — Z95828 Presence of other vascular implants and grafts: Secondary | ICD-10-CM | POA: Insufficient documentation

## 2021-10-02 DIAGNOSIS — C252 Malignant neoplasm of tail of pancreas: Secondary | ICD-10-CM

## 2021-10-02 DIAGNOSIS — Z5111 Encounter for antineoplastic chemotherapy: Secondary | ICD-10-CM | POA: Diagnosis not present

## 2021-10-02 LAB — COMPREHENSIVE METABOLIC PANEL
ALT: 41 U/L (ref 0–44)
AST: 37 U/L (ref 15–41)
Albumin: 3.7 g/dL (ref 3.5–5.0)
Alkaline Phosphatase: 64 U/L (ref 38–126)
Anion gap: 9 (ref 5–15)
BUN: 13 mg/dL (ref 8–23)
CO2: 29 mmol/L (ref 22–32)
Calcium: 10.2 mg/dL (ref 8.9–10.3)
Chloride: 98 mmol/L (ref 98–111)
Creatinine, Ser: 0.78 mg/dL (ref 0.44–1.00)
GFR, Estimated: >60 mL/min (ref >60)
Glucose, Bld: 135 mg/dL — ABNORMAL HIGH (ref 70–99)
Potassium: 3.3 mmol/L — ABNORMAL LOW (ref 3.5–5.1)
Sodium: 136 mmol/L (ref 135–145)
Total Bilirubin: 0.6 mg/dL (ref 0.3–1.2)
Total Protein: 7.5 g/dL (ref 6.5–8.1)

## 2021-10-02 LAB — CBC WITH DIFFERENTIAL/PLATELET
Abs Immature Granulocytes: 0.07 10*3/uL (ref 0.00–0.07)
Basophils Absolute: 0 10*3/uL (ref 0.0–0.1)
Basophils Relative: 0 %
Eosinophils Absolute: 0 10*3/uL (ref 0.0–0.5)
Eosinophils Relative: 1 %
HCT: 34.5 % — ABNORMAL LOW (ref 36.0–46.0)
Hemoglobin: 11.7 g/dL — ABNORMAL LOW (ref 12.0–15.0)
Immature Granulocytes: 1 %
Lymphocytes Relative: 25 %
Lymphs Abs: 1.4 10*3/uL (ref 0.7–4.0)
MCH: 29.4 pg (ref 26.0–34.0)
MCHC: 33.9 g/dL (ref 30.0–36.0)
MCV: 86.7 fL (ref 80.0–100.0)
Monocytes Absolute: 0.5 10*3/uL (ref 0.1–1.0)
Monocytes Relative: 10 %
Neutro Abs: 3.5 10*3/uL (ref 1.7–7.7)
Neutrophils Relative %: 63 %
Platelets: 181 10*3/uL (ref 150–400)
RBC: 3.98 MIL/uL (ref 3.87–5.11)
RDW: 13.4 % (ref 11.5–15.5)
WBC: 5.5 10*3/uL (ref 4.0–10.5)
nRBC: 0 % (ref 0.0–0.2)

## 2021-10-02 MED ORDER — HEPARIN SOD (PORK) LOCK FLUSH 100 UNIT/ML IV SOLN
500.0000 [IU] | Freq: Once | INTRAVENOUS | Status: AC | PRN
  Administered 2021-10-02: 500 [IU]

## 2021-10-02 MED ORDER — SODIUM CHLORIDE 0.9 % IV SOLN: Freq: Once | INTRAVENOUS | Status: AC

## 2021-10-02 MED ORDER — SODIUM CHLORIDE 0.9 % IV SOLN
1000.0000 mg/m2 | Freq: Once | INTRAVENOUS | Status: AC
  Administered 2021-10-02: 2014 mg via INTRAVENOUS
  Filled 2021-10-02: qty 52.97

## 2021-10-02 MED ORDER — TRAMADOL HCL 50 MG PO TABS: 50.0000 mg | ORAL_TABLET | Freq: Four times a day (QID) | ORAL | 0 refills | Status: DC | PRN

## 2021-10-02 MED ORDER — PROCHLORPERAZINE MALEATE 10 MG PO TABS
10.0000 mg | ORAL_TABLET | Freq: Once | ORAL | Status: AC
  Administered 2021-10-02: 10 mg via ORAL
  Filled 2021-10-02: qty 1

## 2021-10-02 MED ORDER — SODIUM CHLORIDE 0.9% FLUSH
10.0000 mL | INTRAVENOUS | Status: DC | PRN
  Administered 2021-10-02: 10 mL

## 2021-10-02 MED ORDER — SODIUM CHLORIDE 0.9% FLUSH
10.0000 mL | Freq: Once | INTRAVENOUS | Status: AC
  Administered 2021-10-02: 10 mL

## 2021-10-02 NOTE — Progress Notes (Signed)
?[place]   ?[phone] [phone]   ?Clinic Follow up Note  ? ?Patient Care Team: ?Crist Infante, MD as PCP - General (Internal Medicine) ?Lorretta Harp, MD as PCP - Cardiology (Cardiology) ?Truitt Merle, MD as Consulting Physician (Oncology) ?Earl Gala, Deliah Goody, RN as Sales executive (Oncology) ? ?Date of Service:  10/02/2021 ? ?CHIEF COMPLAINT: f/u of metastatic pancreatic cancer ? ?CURRENT THERAPY:  ?Gemcitabine, started 09/25/21 ? -Abraxane to be added with C2 ? ?ASSESSMENT & PLAN:  ?Kathryn Kent is a 86 y.o. female with  ? ?1. Metastatic Pancreatic Cancer to peritoneum and lungs, stage IV  ?-presented to PCP with rectal, vaginal, and pelvic area pain, as well as positive Hemoccult stool test. CT AP on 08/30/21 showed 4.7 cm pancreatic tail mass extending into splenic hilum and suspicious for involvement of splenic parenchyma and abutment with possible invasion of colonic splenic flexure; extensive omental and peritoneal nodularity; right cardiophrenic adenopathy and right-sided pulmonary nodules. ?-baseline tumor markers on 09/04/21 were all elevated-- CEA of 24.71, CA 125 of 67.7, and CA 19.9 of 1,208. ?-staging chest CT on 09/06/21 showed: nodularity and thickening throughout right lung; bilateral pulmonary nodules in lower lungs, R>L, up to 11 mm; small right pleural effusion; enlarged right cardiophrenic lymph nodes. ?-biopsy of omental mass on 09/12/21 confirmed metastatic adenocarcinoma, compatible with pancreatic primary based on imaging. ?-she began first cycle chemo with single agent gemcitabine on 09/25/21. She tolerated well overall. We discussed adding Abraxane with cycle 2 with reduced dose and every 2 weeks,vs continuing single agent gemcitabine.  We discussed the benefit and potential side effects with combined chemotherapy.  After lengthy discussion, she would like to try gemcitabine and Abraxane on cycle 2. -labs reviewed, hgb 11.7, as expected from  chemo. Otherwise no concern and adequate to proceed with day 8 gemcitabine. ?  ?2. Weight Loss, Constipation, Pain ?-secondary to #1 ?-chart review shows weight loss of about 20 lbs from 10/2020 - 08/2021. ?-she met with dietician on 09/25/21. ?-she reports bowel pressure/urgency without movement. She has tried prune juice; I recommend she try stool softener or laxatives. ?-she reports pain to her right abdomen, right armpit, and groin areas. She requests pain medications; I called in tramadol. ?  ?3. Thyroid Nodule ?-incidental subcentimeter left thyroid nodule with heterogeneous and enlarged thyroid on chest CT 09/06/21. ?  ?4. Goal of care discussion  ?-We again discussed the incurable nature of her cancer, and the overall poor prognosis, especially if she does not have good response to chemotherapy or progress on chemo ?-The patient understands the goal of care is palliative. ?-she is full code now  ?  ?  ?PLAN: ?-proceed with C1D8 gemcitabine alone today ?-I called in tramadol ?-lab, f/u, and gemcitabine and abraxane with dose reduction in 2 weeks ? -they prefer Monday appointments ? ? ?No problem-specific Assessment & Plan notes found for this encounter. ? ? ?SUMMARY OF ONCOLOGIC HISTORY: ?Oncology History  ?Pancreatic cancer [facility])  ?08/30/2021 Imaging  ? EXAM: ?CT ABDOMEN AND PELVIS WITH CONTRAST ? ?IMPRESSION: ?1. Infiltrative mass in the pancreatic tail which measures 4.7 x 4.4 ?cm, highly suspicious for pancreatic adenocarcinoma. ?2. The mass extends into the splenic hilum with findings suspicious ?for involvement of the splenic parenchyma as well as abutment with possible invasion of the colonic splenic flexure. ?3. Extensive omental and peritoneal nodularity with trace ?abdominopelvic free fluid, most consistent with carcinomatosis. ?4. Right cardiophrenic adenopathy and right sided pulmonary nodules measuring up to 11  mm, concerning for metastatic disease. ?5. Central filling defect within an enlarged left  common femoral ?vein, consistent with nonocclusive deep venous thrombosis. ?6. Symmetric wall thickening of the distal esophagus with reflux ?versus retract retained contrast in the distal esophagus. Correlate ?for esophagitis. ?7. Hypodense hepatic lesions measure up to 1 cm in the right lower lobe appear to have been present on prior non contrasted CT and likely reflect cysts. No new discrete hepatic lesion identified.  ?8. Lobular uterine contour with numerous dystrophic calcifications ?likely reflecting uterine leiomyomas. ?9.  Aortic Atherosclerosis (ICD10-I70.0). ?  ?09/06/2021 Imaging  ? EXAM: ?CT CHEST WITH CONTRAST ? ?IMPRESSION: ?1. Fissural nodularity and fissural thickening throughout the right ?lung concerning for metastatic disease. ?  ?2. Additionally there are bilateral pulmonary nodules in the lower ?lungs, right greater than left, measuring up to 11 mm suspicious for ?metastatic disease. ?  ?3.  Small right pleural effusion. ?  ?4. Enlarged right cardiophrenic lymph nodes suspicious for ?metastatic disease. ?  ?5. Subcentimeter incidental left thyroid nodule with heterogeneous ?and enlarged thyroid. Recommend thyroid US. Reference: J Am Coll Radiol. 2015 Feb;12(2): 143-50 ?  ?4. Pancreatic mass, peritoneal metastases and hypodense liver ?lesions are unchanged from 08/30/2021. ?  ?09/12/2021 Initial Biopsy  ? FINAL MICROSCOPIC DIAGNOSIS:  ? ?A.   OMENTAL MASS, NEEDLE CORE BIOPSY:  ?-    Metastatic adenocarcinoma, see Comment.  ? ?COMMENT:  ?The adenocarcinoma was interrogated with immunohistochemical (IHC) stains (CK7, CK20, PAX8, CDX-2, GATA-3). The malignant cells are CK7 positive, CK20 negative and have weak CDX-2 reactivity. The tumor cells are negative for GATA-3 (breast marker) and have weak/equivocal nuclear reactivity with PAX8.  This immunophenotype is non-specific, but is compatible with a pancreatic ductal primary, which is the favored primary site based on imaging studies. The controls are  satisfactory.  ?  ?09/15/2021 Initial Diagnosis  ? Pancreatic cancer [facility]) ?  ?09/25/2021 -  Chemotherapy  ? Patient is on Treatment Plan : PANCREATIC Abraxane / Gemcitabine D1,8, q21d  ?   ? ? ? ?INTERVAL HISTORY:  ?Kathryn Kent is here for a follow up of metastatic pancreatic cancer. She was last seen by me on 09/15/21. She presents to the clinic accompanied by a family member/caregiver. ?She reports she is doing well overall except for some constipation. She endorses using prune juice and had a bowel movement on Saturday but not yesterday. ?  ?All other systems were reviewed with the patient and are negative. ? ?MEDICAL HISTORY:  ?Past Medical History:  ?Diagnosis Date  ? Anemia   ? Coronary artery disease cardiologist---dr berry  ? cardiac cath --   10/16/05 revealing noncritical CAD  ? Cyst of left kidney   ? small  ? Diverticulosis   ? Heart murmur   ? History of colon polyps   ? History of kidney stones   ? Hyperlipidemia   ? Hypertension   ? Leiomyoma of uterus   ? Liver cyst   ? small  ? OSA on CPAP   ? Osteoarthritis   ? Periumbilical hernia   ? Moderate  ? PMB (postmenopausal bleeding)   ? Vulvar cyst   ? Wears dentures   ? upper  ? ? ?SURGICAL HISTORY: ?Past Surgical History:  ?Procedure Laterality Date  ? APPENDECTOMY  age 42  ? and removal cyst  ? CARDIAC CATHETERIZATION  10-16-2005   dr berry  ? nonobstructive cad w/ mid segmental LAD 30-40% ,  normal LVF  ? COLONOSCOPY  06/24/2017  ? CYSTOSCOPY  W/ URETERAL STENT PLACEMENT Right 01/03/2017  ? Procedure: CYSTOSCOPY WITH RETROGRADE PYELOGRAM/ URETEROSCOPY/ STONE BASKETRY/URETERAL STENT PLACEMENT;  Surgeon: Cleon Gustin, MD;  Location: [facility];  Service: Urology;  Laterality: Right;  ? DILATATION & CURETTAGE/HYSTEROSCOPY WITH MYOSURE N/A 12/30/2018  ? Procedure: [place];  Surgeon: Megan Salon, MD;  Location: [facility];  Service: Gynecology;  Laterality: N/A;  ? FOOT  ARTHRODESIS Bilateral 1987  ? great toe's  ? great toe surgery Bilateral   ? HOLMIUM LASER APPLICATION Right 4/98/6516  ? Procedure: HOLMIUM LASER APPLICATION;  Surgeon: Cleon Gustin, MD;  Location: [facility]

## 2021-10-02 NOTE — Patient Instructions (Signed)
[facility] MEDICAL ONCOLOGY  Discharge Instructions: Thank you for choosing [facility] to provide your oncology and hematology care.   If you have a lab appointment with the Cancer Center, please go directly to the Cancer Center and check in at the registration area.   Wear comfortable clothing and clothing appropriate for easy access to any Portacath or PICC line.   We strive to give you quality time with your provider. You may need to reschedule your appointment if you arrive late (15 or more minutes).  Arriving late affects you and other patients whose appointments are after yours.  Also, if you miss three or more appointments without notifying the office, you may be dismissed from the clinic at the provider's discretion.      For prescription refill requests, have your pharmacy contact our office and allow 72 hours for refills to be completed.    Today you received the following chemotherapy and/or immunotherapy agents Gemzar      To help prevent nausea and vomiting after your treatment, we encourage you to take your nausea medication as directed.  BELOW ARE SYMPTOMS THAT SHOULD BE REPORTED IMMEDIATELY: *FEVER GREATER THAN 100.4 F (38 C) OR HIGHER *CHILLS OR SWEATING *NAUSEA AND VOMITING THAT IS NOT CONTROLLED WITH YOUR NAUSEA MEDICATION *UNUSUAL SHORTNESS OF BREATH *UNUSUAL BRUISING OR BLEEDING *URINARY PROBLEMS (pain or burning when urinating, or frequent urination) *BOWEL PROBLEMS (unusual diarrhea, constipation, pain near the anus) TENDERNESS IN MOUTH AND THROAT WITH OR WITHOUT PRESENCE OF ULCERS (sore throat, sores in mouth, or a toothache) UNUSUAL RASH, SWELLING OR PAIN  UNUSUAL VAGINAL DISCHARGE OR ITCHING   Items with * indicate a potential emergency and should be followed up as soon as possible or go to the Emergency Department if any problems should occur.  Please show the CHEMOTHERAPY ALERT CARD or IMMUNOTHERAPY ALERT CARD at check-in to the  Emergency Department and triage nurse.  Should you have questions after your visit or need to cancel or reschedule your appointment, please contact [facility]  Dept: [phone]  and follow the prompts.  Office hours are 8:00 a.m. to 4:30 p.m. Monday - Friday. Please note that voicemails left after 4:00 p.m. may not be returned until the following business day.  We are closed weekends and major holidays. You have access to a nurse at all times for urgent questions. Please call the main number to the clinic Dept: [phone] and follow the prompts.   For any non-urgent questions, you may also contact your provider using MyChart. We now offer e-Visits for anyone 18 and older to request care online for non-urgent symptoms. For details visit [email or web address].   Also download the MyChart app! Go to the app store, search "MyChart", open the app, select [facility], and log in with your MyChart username and password.  Due to Covid, a mask is required upon entering the hospital/clinic. If you do not have a mask, one will be given to you upon arrival. For doctor visits, patients may have 1 support person aged 18 or older with them. For treatment visits, patients cannot have anyone with them due to current Covid guidelines and our immunocompromised population.   

## 2021-10-03 ENCOUNTER — Encounter: Payer: Self-pay | Admitting: Licensed Clinical Social Worker

## 2021-10-03 ENCOUNTER — Other Ambulatory Visit: Payer: Self-pay

## 2021-10-03 ENCOUNTER — Ambulatory Visit: Payer: Self-pay | Admitting: Licensed Clinical Social Worker

## 2021-10-03 ENCOUNTER — Encounter: Payer: Self-pay | Admitting: Hematology

## 2021-10-03 ENCOUNTER — Telehealth: Payer: Self-pay

## 2021-10-03 DIAGNOSIS — Z1379 Encounter for other screening for genetic and chromosomal anomalies: Secondary | ICD-10-CM

## 2021-10-03 LAB — CANCER ANTIGEN 19-9: CA 19-9: 1295 U/mL — ABNORMAL HIGH (ref 0–35)

## 2021-10-03 MED ORDER — POTASSIUM CHLORIDE CRYS ER 10 MEQ PO TBCR: 10.0000 meq | EXTENDED_RELEASE_TABLET | Freq: Once | ORAL | 1 refills | Status: DC

## 2021-10-03 NOTE — Progress Notes (Signed)
HPI:  Kathryn Kent was previously seen in the [place] clinic due to a personal and family history of cancer and concerns regarding a hereditary predisposition to cancer. Please refer to our prior cancer genetics clinic note for more information regarding our discussion, assessment and recommendations, at the time. Kathryn Kent recent genetic test results were disclosed to her, as were recommendations warranted by these results. These results and recommendations are discussed in more detail below. ? ?CANCER HISTORY:  ?Oncology History  ?Pancreatic cancer [facility])  ?08/30/2021 Imaging  ? EXAM: ?CT ABDOMEN AND PELVIS WITH CONTRAST ? ?IMPRESSION: ?1. Infiltrative mass in the pancreatic tail which measures 4.7 x 4.4 ?cm, highly suspicious for pancreatic adenocarcinoma. ?2. The mass extends into the splenic hilum with findings suspicious ?for involvement of the splenic parenchyma as well as abutment with possible invasion of the colonic splenic flexure. ?3. Extensive omental and peritoneal nodularity with trace ?abdominopelvic free fluid, most consistent with carcinomatosis. ?4. Right cardiophrenic adenopathy and right sided pulmonary nodules measuring up to 11 mm, concerning for metastatic disease. ?5. Central filling defect within an enlarged left common femoral ?vein, consistent with nonocclusive deep venous thrombosis. ?6. Symmetric wall thickening of the distal esophagus with reflux ?versus retract retained contrast in the distal esophagus. Correlate ?for esophagitis. ?7. Hypodense hepatic lesions measure up to 1 cm in the right lower lobe appear to have been present on prior non contrasted CT and likely reflect cysts. No new discrete hepatic lesion identified.  ?8. Lobular uterine contour with numerous dystrophic calcifications ?likely reflecting uterine leiomyomas. ?9.  Aortic Atherosclerosis (ICD10-I70.0). ?  ?09/06/2021 Imaging  ? EXAM: ?CT CHEST WITH CONTRAST ? ?IMPRESSION: ?1. Fissural nodularity and  fissural thickening throughout the right ?lung concerning for metastatic disease. ?  ?2. Additionally there are bilateral pulmonary nodules in the lower ?lungs, right greater than left, measuring up to 11 mm suspicious for ?metastatic disease. ?  ?3.  Small right pleural effusion. ?  ?4. Enlarged right cardiophrenic lymph nodes suspicious for ?metastatic disease. ?  ?5. Subcentimeter incidental left thyroid nodule with heterogeneous ?and enlarged thyroid. Recommend thyroid US. Reference: J Am Coll Radiol. 2015 Feb;12(2): 143-50 ?  ?4. Pancreatic mass, peritoneal metastases and hypodense liver ?lesions are unchanged from 08/30/2021. ?  ?09/12/2021 Initial Biopsy  ? FINAL MICROSCOPIC DIAGNOSIS:  ? ?A.   OMENTAL MASS, NEEDLE CORE BIOPSY:  ?-    Metastatic adenocarcinoma, see Comment.  ? ?COMMENT:  ?The adenocarcinoma was interrogated with immunohistochemical (IHC) stains (CK7, CK20, PAX8, CDX-2, GATA-3). The malignant cells are CK7 positive, CK20 negative and have weak CDX-2 reactivity. The tumor cells are negative for GATA-3 (breast marker) and have weak/equivocal nuclear reactivity with PAX8.  This immunophenotype is non-specific, but is compatible with a pancreatic ductal primary, which is the favored primary site based on imaging studies. The controls are satisfactory.  ?  ?09/15/2021 Initial Diagnosis  ? Pancreatic cancer [facility]) ?  ?09/25/2021 -  Chemotherapy  ? Patient is on Treatment Plan : PANCREATIC Abraxane / Gemcitabine D1,8, q21d  ?   ? Genetic Testing  ? Negative genetic testing. No pathogenic variants identified on the [facility] BRCA1/2 STAT+ CustomNext+RNA panel. The report date is 10/02/2021. ? ?The CustomNext-Cancer+RNAinsight panel offered by [facility] includes sequencing and rearrangement analysis for the following 47 genes:  APC, ATM, AXIN2, BARD1, BMPR1A, BRCA1, BRCA2, BRIP1, CDH1, CDK4, CDKN2A, CHEK2, DICER1, EPCAM, GREM1, HOXB13, MEN1, MLH1, MSH2, MSH3, MSH6, MUTYH, NBN, NF1, NF2, NTHL1, PALB2, PMS2,  POLD1, POLE, PTEN, RAD51C, RAD51D,  RECQL, RET, SDHA, SDHAF2, SDHB, SDHC, SDHD, SMAD4, SMARCA4, STK11, TP53, TSC1, TSC2, and VHL.  RNA data is routinely analyzed for use in variant interpretation for all genes. ? ?  ? ? ?FAMILY HISTORY:  ?We obtained a detailed, 4-generation family history.  Significant diagnoses are listed below: ?Family History  ?Problem Relation Age of Onset  ? Alcohol abuse Mother   ? Prostate cancer Father   ?     vs stomach cancer  ? Stroke Sister   ? Breast cancer Sister 96  ? Uterine cancer Sister   ?     dx 35s  ? Aneurysm Brother   ?     heart  ? Leukemia Maternal Aunt   ? Colon cancer Other   ?     dx 53s  ? ? ?  ?Kathryn Kent had 2 sisters and 3 brothers. One sister had uterine cancer in her 104s, breast at 84, and is living at 6. ?  ?Kathryn Kent mother died at 52. Patient had 1 aunt who had leukemia, her son also had leukemia. No other known cancers on this side of the family. ?  ?Kathryn Kent father had prostate or stomach cancer at died at 73, it was metastatic. Patient had 1 uncle who had bone cancer and a cousin who had colon cancer in his 94s, no other known cancers on this side of the family.  ?  ?Kathryn Kent is unaware of previous family history of genetic testing for hereditary cancer risks. There is no reported Ashkenazi Jewish ancestry. There is no known consanguinity. ?  ? ? ?GENETIC TEST RESULTS: Genetic testing reported out on 10/02/2021 through the [facility] CustomNext+RNA cancer panel found no pathogenic mutations.  ? ?The CustomNext-Cancer+RNAinsight panel offered by [facility] includes sequencing and rearrangement analysis for the following 47 genes:  APC, ATM, AXIN2, BARD1, BMPR1A, BRCA1, BRCA2, BRIP1, CDH1, CDK4, CDKN2A, CHEK2, DICER1, EPCAM, GREM1, HOXB13, MEN1, MLH1, MSH2, MSH3, MSH6, MUTYH, NBN, NF1, NF2, NTHL1, PALB2, PMS2, POLD1, POLE, PTEN, RAD51C, RAD51D, RECQL, RET, SDHA, SDHAF2, SDHB, SDHC, SDHD, SMAD4, SMARCA4, STK11, TP53, TSC1, TSC2, and VHL.  RNA data is routinely  analyzed for use in variant interpretation for all genes. ? ? ?The test report has been scanned into EPIC and is located under the Molecular Pathology section of the Results Review tab.  A portion of the result report is included below for reference.  ? ? ? ?We discussed that because current genetic testing is not perfect, it is possible there may be a gene mutation in one of these genes that current testing cannot detect, but that chance is small.  There could be another gene that has not yet been discovered, or that we have not yet tested, that is responsible for the cancer diagnoses in the family. It is also possible there is a hereditary cause for the cancer in the family that Kathryn Kent did not inherit and therefore was not identified in her testing.  Therefore, it is important to remain in touch with cancer genetics in the future so that we can continue to offer Kathryn Kent the most up to date genetic testing.  ? ?ADDITIONAL GENETIC TESTING: We discussed with Kathryn Kent that her genetic testing was fairly extensive.  If there are genes identified to increase cancer risk that can be analyzed in the future, we would be happy to discuss and coordinate this testing at that time.   ? ?CANCER SCREENING RECOMMENDATIONS: Kathryn Kent test result is considered negative (normal).  This means that we have not identified a hereditary cause for her  personal and family history of cancer at this time. Most cancers happen by chance and this negative test suggests that her cancer may fall into this category.   ? ?While reassuring, this does not definitively rule out a hereditary predisposition to cancer. It is still possible that there could be genetic mutations that are undetectable by current technology. There could be genetic mutations in genes that have not been tested or identified to increase cancer risk.  Therefore, it is recommended she continue to follow the cancer management and screening guidelines provided by her oncology  and primary healthcare provider.  ? ?An individual's cancer risk and medical management are not determined by genetic test results alone. Overall cancer risk assessment incorporates additional factors, in

## 2021-10-03 NOTE — Telephone Encounter (Signed)
Revealed negative genetic testing.   This normal result is reassuring and indicates that it is unlikely Kathryn Kent's cancer is due to a hereditary cause.  It is unlikely that there is an increased risk of another cancer due to a mutation in one of these genes.  However, genetic testing is not perfect, and cannot definitively rule out a hereditary cause.  It will be important for her to keep in contact with genetics to learn if any additional testing may be needed in the future.    ? ?

## 2021-10-03 NOTE — Progress Notes (Signed)
Verbal Order given by Dr. Burr Medico for a Home prescription of K+ 53mq by mouth daily for 30 days.    ?

## 2021-10-03 NOTE — Telephone Encounter (Signed)
Spoke with pt via telephone regarding her labs drawn on 10/02/2021.  Informed pt that her K+ was low and Dr. Burr Medico would like for the pt to start taking Klor-Con 32mq daily.  In[facility] pt [facility]  Pt stated she will have her niece to pick it up in the morning because she's gone to work this evening.  Pt has no further questions or concerns at this time. ?

## 2021-10-04 ENCOUNTER — Telehealth: Payer: Self-pay

## 2021-10-04 ENCOUNTER — Telehealth: Payer: Self-pay | Admitting: Hematology

## 2021-10-04 ENCOUNTER — Other Ambulatory Visit: Payer: Self-pay

## 2021-10-04 NOTE — Telephone Encounter (Signed)
Spoke with pt's niece Myra regarding pt's appointment scheduled.  Informed Myra that the pt's next appt is scheduled on 10/16/2021 for labs, F/U with Cassandra, PA, and Infusion.  Informed Myra that pt's 1st appt starts at 8am and to arrive no later than 7:45am to allow for registration check-in.  Myra verbalized understanding of instructions.  Also informed Myra that this RN spoke with pt yesterday regarding prescription for K+ that was sent to [facility].  Myra stated the pt forgot to tell her to pick up the prescription but she will go right now to pick up the prescription.  Myra had no further questions at this time. ?

## 2021-10-04 NOTE — Telephone Encounter (Signed)
Scheduled follow-up appointments per 4/10 los. Patient is aware. ?

## 2021-10-11 ENCOUNTER — Encounter: Payer: Self-pay | Admitting: Internal Medicine

## 2021-10-12 NOTE — Progress Notes (Signed)
[facility] ?OFFICE PROGRESS NOTE ? ?Crist Infante, MD ?[address] ?[place] [address] ? ?DIAGNOSIS: f/u of metastatic pancreatic cancer ? ?Oncology History  ?Pancreatic cancer [facility])  ?08/30/2021 Imaging  ? EXAM: ?CT ABDOMEN AND PELVIS WITH CONTRAST ? ?IMPRESSION: ?1. Infiltrative mass in the pancreatic tail which measures 4.7 x 4.4 ?cm, highly suspicious for pancreatic adenocarcinoma. ?2. The mass extends into the splenic hilum with findings suspicious ?for involvement of the splenic parenchyma as well as abutment with possible invasion of the colonic splenic flexure. ?3. Extensive omental and peritoneal nodularity with trace ?abdominopelvic free fluid, most consistent with carcinomatosis. ?4. Right cardiophrenic adenopathy and right sided pulmonary nodules measuring up to 11 mm, concerning for metastatic disease. ?5. Central filling defect within an enlarged left common femoral ?vein, consistent with nonocclusive deep venous thrombosis. ?6. Symmetric wall thickening of the distal esophagus with reflux ?versus retract retained contrast in the distal esophagus. Correlate ?for esophagitis. ?7. Hypodense hepatic lesions measure up to 1 cm in the right lower lobe appear to have been present on prior non contrasted CT and likely reflect cysts. No new discrete hepatic lesion identified.  ?8. Lobular uterine contour with numerous dystrophic calcifications ?likely reflecting uterine leiomyomas. ?9.  Aortic Atherosclerosis (ICD10-I70.0). ?  ?09/06/2021 Imaging  ? EXAM: ?CT CHEST WITH CONTRAST ? ?IMPRESSION: ?1. Fissural nodularity and fissural thickening throughout the right ?lung concerning for metastatic disease. ?  ?2. Additionally there are bilateral pulmonary nodules in the lower ?lungs, right greater than left, measuring up to 11 mm suspicious for ?metastatic disease. ?  ?3.  Small right pleural effusion. ?  ?4. Enlarged right cardiophrenic lymph nodes suspicious for ?metastatic disease. ?  ?5.  Subcentimeter incidental left thyroid nodule with heterogeneous ?and enlarged thyroid. Recommend thyroid US. Reference: J Am Coll Radiol. 2015 Feb;12(2): 143-50 ?  ?4. Pancreatic mass, peritoneal metastases and hypodense liver ?lesions are unchanged from 08/30/2021. ?  ?09/12/2021 Initial Biopsy  ? FINAL MICROSCOPIC DIAGNOSIS:  ? ?A.   OMENTAL MASS, NEEDLE CORE BIOPSY:  ?-    Metastatic adenocarcinoma, see Comment.  ? ?COMMENT:  ?The adenocarcinoma was interrogated with immunohistochemical (IHC) stains (CK7, CK20, PAX8, CDX-2, GATA-3). The malignant cells are CK7 positive, CK20 negative and have weak CDX-2 reactivity. The tumor cells are negative for GATA-3 (breast marker) and have weak/equivocal nuclear reactivity with PAX8.  This immunophenotype is non-specific, but is compatible with a pancreatic ductal primary, which is the favored primary site based on imaging studies. The controls are satisfactory.  ?  ?09/15/2021 Initial Diagnosis  ? Pancreatic cancer [facility]) ?  ?09/25/2021 -  Chemotherapy  ? Patient is on Treatment Plan : PANCREATIC Abraxane / Gemcitabine D1,8, q21d  ? ?  ?  ? Genetic Testing  ? Negative genetic testing. No pathogenic variants identified on the [facility] BRCA1/2 STAT+ CustomNext+RNA panel. The report date is 10/02/2021. ? ?The CustomNext-Cancer+RNAinsight panel offered by [facility] includes sequencing and rearrangement analysis for the following 47 genes:  APC, ATM, AXIN2, BARD1, BMPR1A, BRCA1, BRCA2, BRIP1, CDH1, CDK4, CDKN2A, CHEK2, DICER1, EPCAM, GREM1, HOXB13, MEN1, MLH1, MSH2, MSH3, MSH6, MUTYH, NBN, NF1, NF2, NTHL1, PALB2, PMS2, POLD1, POLE, PTEN, RAD51C, RAD51D, RECQL, RET, SDHA, SDHAF2, SDHB, SDHC, SDHD, SMAD4, SMARCA4, STK11, TP53, TSC1, TSC2, and VHL.  RNA data is routinely analyzed for use in variant interpretation for all genes. ? ?  ? ? ?CURRENT THERAPY:  Gemcitabine, started 09/25/21 ?            -Abraxane added with C2 ? ?  INTERVAL HISTORY: ?Kathryn Kent 86 y.o. female returns  to the clinic today for follow-up visit accompanied by two friends.  The patient was recently diagnosed with pancreatic cancer.  She underwent cycle #1 with single agent gemcitabine.  The patient tolerated cycle #1 fairly well and Dr. Burr Medico is planning on adding Abraxane starting from cycle #2 which is expected to start today.  Overall, the patient denies any new concerning complaints.  She denies any fever, chills, or night sweats.  Her appetite is improving per patient report but she lost about 3 more lbs. She is drinking protein drinks. She is pre-diabetic and is wondering which type of drink to drink She has some nausea in the mornings for which she takes her anti-emetics. She denies any vomiting or diarrhea.  The patient sometimes has constipation. She is wondering what she can take. She reports she had to give herself an enema a few days ago.  Her last bowel movement was yesterday evening; however, she felt like it was an incomplete bowel movement.  The patient takes tramadol for pain which she takes approximately every 6 hours. She localizes her pain intermittently to the lateral right abdomen/ribs. She denies any chest pain, shortness of breath, or cough.  She denies any blood in the stool. She is wondering if she should take iron. Denies known bleeding. She takes a multivitamin which contains iron. She is here today for evaluation and repeat blood work before starting cycle #2. ? ? ? ?MEDICAL HISTORY: ?Past Medical History:  ?Diagnosis Date  ? Anemia   ? Coronary artery disease cardiologist---dr berry  ? cardiac cath --   10/16/05 revealing noncritical CAD  ? Cyst of left kidney   ? small  ? Diverticulosis   ? Heart murmur   ? History of colon polyps   ? History of kidney stones   ? Hyperlipidemia   ? Hypertension   ? Leiomyoma of uterus   ? Liver cyst   ? small  ? OSA on CPAP   ? Osteoarthritis   ? Periumbilical hernia   ? Moderate  ? PMB (postmenopausal bleeding)   ? Vulvar cyst   ? Wears dentures   ? upper   ? ? ?ALLERGIES:  is allergic to lipitor [atorvastatin]. ? ?MEDICATIONS:  ?Current Outpatient Medications  ?Medication Sig Dispense Refill  ? acetaminophen (TYLENOL) 500 MG tablet Take 1,000 mg by mouth every 6 (six) hours as needed for mild pain.    ? amLODipine (NORVASC) 10 MG tablet Take 10 mg by mouth daily.     ? chlorthalidone (HYGROTON) 25 MG tablet Take 0.5 tablets by mouth daily.     ? ezetimibe (ZETIA) 10 MG tablet Take 10 mg by mouth daily.    ? ibuprofen (ADVIL) 600 MG tablet Take 1 tablet (600 mg total) by mouth every 8 (eight) hours as needed. 30 tablet 0  ? irbesartan (AVAPRO) 300 MG tablet Take 1 tablet by mouth daily.   1  ? lidocaine-prilocaine (EMLA) cream Apply to affected area once 30 g 3  ? montelukast (SINGULAIR) 10 MG tablet Take 1 tablet by mouth at bedtime.    ? Multiple Vitamin (MULTIVITAMIN) tablet Take 1 tablet by mouth daily.    ? ondansetron (ZOFRAN) 8 MG tablet Take 1 tablet (8 mg total) by mouth 2 (two) times daily as needed (Nausea or vomiting). 30 tablet 1  ? potassium chloride (KLOR-CON M) 10 MEQ tablet Take 1 tablet (10 mEq total) by mouth once for 1 dose.  30 tablet 1  ? prochlorperazine (COMPAZINE) 10 MG tablet Take 1 tablet (10 mg total) by mouth every 6 (six) hours as needed (Nausea or vomiting). 30 tablet 1  ? simvastatin (ZOCOR) 40 MG tablet Take 20 mg by mouth 2 (two) times daily.     ? traMADol (ULTRAM) 50 MG tablet Take 1 tablet (50 mg total) by mouth every 6 (six) hours as needed. 20 tablet 0  ? ?No current facility-administered medications for this visit.  ? ? ?SURGICAL HISTORY:  ?Past Surgical History:  ?Procedure Laterality Date  ? APPENDECTOMY  age 30  ? and removal cyst  ? CARDIAC CATHETERIZATION  10-16-2005   dr berry  ? nonobstructive cad w/ mid segmental LAD 30-40% ,  normal LVF  ? COLONOSCOPY  06/24/2017  ? CYSTOSCOPY W/ URETERAL STENT PLACEMENT Right 01/03/2017  ? Procedure: CYSTOSCOPY WITH RETROGRADE PYELOGRAM/ URETEROSCOPY/ STONE BASKETRY/URETERAL STENT  PLACEMENT;  Surgeon: Cleon Gustin, MD;  Location: [facility];  Service: Urology;  Laterality: Right;  ? DILATATION & CURETTAGE/HYSTEROSCOPY WITH MYOSURE N/A 12/30/2018  ? Procedure: DILATATI

## 2021-10-16 ENCOUNTER — Inpatient Hospital Stay (HOSPITAL_BASED_OUTPATIENT_CLINIC_OR_DEPARTMENT_OTHER): Payer: Medicare Other | Admitting: Physician Assistant

## 2021-10-16 ENCOUNTER — Other Ambulatory Visit: Payer: Self-pay

## 2021-10-16 ENCOUNTER — Inpatient Hospital Stay (HOSPITAL_BASED_OUTPATIENT_CLINIC_OR_DEPARTMENT_OTHER): Payer: Medicare Other

## 2021-10-16 ENCOUNTER — Inpatient Hospital Stay: Payer: Medicare Other

## 2021-10-16 VITALS — BP 148/76 | HR 94 | Temp 97.8°F | Wt 183.6 lb

## 2021-10-16 DIAGNOSIS — C252 Malignant neoplasm of tail of pancreas: Secondary | ICD-10-CM

## 2021-10-16 DIAGNOSIS — Z95828 Presence of other vascular implants and grafts: Secondary | ICD-10-CM

## 2021-10-16 DIAGNOSIS — Z5111 Encounter for antineoplastic chemotherapy: Secondary | ICD-10-CM | POA: Diagnosis not present

## 2021-10-16 LAB — COMPREHENSIVE METABOLIC PANEL
ALT: 28 U/L (ref 0–44)
AST: 18 U/L (ref 15–41)
Albumin: 3.9 g/dL (ref 3.5–5.0)
Alkaline Phosphatase: 74 U/L (ref 38–126)
Anion gap: 7 (ref 5–15)
BUN: 12 mg/dL (ref 8–23)
CO2: 28 mmol/L (ref 22–32)
Calcium: 9.9 mg/dL (ref 8.9–10.3)
Chloride: 100 mmol/L (ref 98–111)
Creatinine, Ser: 0.8 mg/dL (ref 0.44–1.00)
GFR, Estimated: >60 mL/min (ref >60)
Glucose, Bld: 135 mg/dL — ABNORMAL HIGH (ref 70–99)
Potassium: 3.7 mmol/L (ref 3.5–5.1)
Sodium: 135 mmol/L (ref 135–145)
Total Bilirubin: 0.6 mg/dL (ref 0.3–1.2)
Total Protein: 7.8 g/dL (ref 6.5–8.1)

## 2021-10-16 LAB — CBC WITH DIFFERENTIAL/PLATELET
Abs Immature Granulocytes: 0.03 10*3/uL (ref 0.00–0.07)
Basophils Absolute: 0 10*3/uL (ref 0.0–0.1)
Basophils Relative: 1 %
Eosinophils Absolute: 0.1 10*3/uL (ref 0.0–0.5)
Eosinophils Relative: 1 %
HCT: 34.8 % — ABNORMAL LOW (ref 36.0–46.0)
Hemoglobin: 11.7 g/dL — ABNORMAL LOW (ref 12.0–15.0)
Immature Granulocytes: 1 %
Lymphocytes Relative: 17 %
Lymphs Abs: 1.1 10*3/uL (ref 0.7–4.0)
MCH: 29.4 pg (ref 26.0–34.0)
MCHC: 33.6 g/dL (ref 30.0–36.0)
MCV: 87.4 fL (ref 80.0–100.0)
Monocytes Absolute: 0.5 10*3/uL (ref 0.1–1.0)
Monocytes Relative: 8 %
Neutro Abs: 4.6 10*3/uL (ref 1.7–7.7)
Neutrophils Relative %: 72 %
Platelets: 283 10*3/uL (ref 150–400)
RBC: 3.98 MIL/uL (ref 3.87–5.11)
RDW: 14.4 % (ref 11.5–15.5)
WBC: 6.3 10*3/uL (ref 4.0–10.5)
nRBC: 0 % (ref 0.0–0.2)

## 2021-10-16 MED ORDER — SODIUM CHLORIDE 0.9 % IV SOLN
800.0000 mg/m2 | Freq: Once | INTRAVENOUS | Status: AC
  Administered 2021-10-16: 1596 mg via INTRAVENOUS
  Filled 2021-10-16: qty 41.98

## 2021-10-16 MED ORDER — SODIUM CHLORIDE 0.9% FLUSH
10.0000 mL | INTRAVENOUS | Status: DC | PRN
  Administered 2021-10-16: 10 mL

## 2021-10-16 MED ORDER — PACLITAXEL PROTEIN-BOUND CHEMO INJECTION 100 MG
100.0000 mg/m2 | Freq: Once | INTRAVENOUS | Status: AC
  Administered 2021-10-16: 200 mg via INTRAVENOUS
  Filled 2021-10-16: qty 40

## 2021-10-16 MED ORDER — SODIUM CHLORIDE 0.9 % IV SOLN: Freq: Once | INTRAVENOUS | Status: AC

## 2021-10-16 MED ORDER — HEPARIN SOD (PORK) LOCK FLUSH 100 UNIT/ML IV SOLN
500.0000 [IU] | Freq: Once | INTRAVENOUS | Status: AC | PRN
  Administered 2021-10-16: 500 [IU]

## 2021-10-16 MED ORDER — PROCHLORPERAZINE MALEATE 10 MG PO TABS
10.0000 mg | ORAL_TABLET | Freq: Once | ORAL | Status: AC
  Administered 2021-10-16: 10 mg via ORAL
  Filled 2021-10-16: qty 1

## 2021-10-16 MED ORDER — SODIUM CHLORIDE 0.9% FLUSH
10.0000 mL | Freq: Once | INTRAVENOUS | Status: AC
  Administered 2021-10-16: 10 mL

## 2021-10-16 NOTE — Patient Instructions (Signed)
[facility]  Discharge Instructions: ?Thank you for choosing [facility] to provide your oncology and hematology care.  ? ?If you have a lab appointment with the [facility], please go directly to the [facility] and check in at the registration area. ?  ?Wear comfortable clothing and clothing appropriate for easy access to any Portacath or PICC line.  ? ?We strive to give you quality time with your provider. You may need to reschedule your appointment if you arrive late (15 or more minutes).  Arriving late affects you and other patients whose appointments are after yours.  Also, if you miss three or more appointments without notifying the office, you may be dismissed from the clinic at the provider?s discretion.    ?  ?For prescription refill requests, have your pharmacy contact our office and allow 72 hours for refills to be completed.   ? ?Today you received the following chemotherapy and/or immunotherapy agents: Abraxane, Gemzar.     ?  ?To help prevent nausea and vomiting after your treatment, we encourage you to take your nausea medication as directed. ? ?BELOW ARE SYMPTOMS THAT SHOULD BE REPORTED IMMEDIATELY: ?*FEVER GREATER THAN 100.4 F (38 ?C) OR HIGHER ?*CHILLS OR SWEATING ?*NAUSEA AND VOMITING THAT IS NOT CONTROLLED WITH YOUR NAUSEA MEDICATION ?*UNUSUAL SHORTNESS OF BREATH ?*UNUSUAL BRUISING OR BLEEDING ?*URINARY PROBLEMS (pain or burning when urinating, or frequent urination) ?*BOWEL PROBLEMS (unusual diarrhea, constipation, pain near the anus) ?TENDERNESS IN MOUTH AND THROAT WITH OR WITHOUT PRESENCE OF ULCERS (sore throat, sores in mouth, or a toothache) ?UNUSUAL RASH, SWELLING OR PAIN  ?UNUSUAL VAGINAL DISCHARGE OR ITCHING  ? ?Items with * indicate a potential emergency and should be followed up as soon as possible or go to the Emergency Department if any problems should occur. ? ?Please show the CHEMOTHERAPY ALERT CARD or IMMUNOTHERAPY ALERT CARD at  check-in to the Emergency Department and triage nurse. ? ?Should you have questions after your visit or need to cancel or reschedule your appointment, please contact [facility]  Dept: [phone]  and follow the prompts.  Office hours are 8:00 a.m. to 4:30 p.m. Monday - Friday. Please note that voicemails left after 4:00 p.m. may not be returned until the following business day.  We are closed weekends and major holidays. You have access to a nurse at all times for urgent questions. Please call the main number to the clinic Dept: [phone] and follow the prompts. ? ? ?For any non-urgent questions, you may also contact your provider using MyChart. We now offer e-Visits for anyone 23 and older to request care online for non-urgent symptoms. For details visit [email or web address]. ?  ?Also download the MyChart app! Go to the app store, search "MyChart", open the app, select [facility], and log in with your MyChart username and password. ? ?Due to Covid, a mask is required upon entering the hospital/clinic. If you do not have a mask, one will be given to you upon arrival. For doctor visits, patients may have 1 support person aged 44 or older with them. For treatment visits, patients cannot have anyone with them due to current Covid guidelines and our immunocompromised population.  ? ?

## 2021-10-16 NOTE — Patient Instructions (Signed)
It is common for patients who are undergoing treatment and taking certain prescribed medications to experience side-effects with constipation.  If you experience constipation, please take stool softener such as Colace or Senna one tablet twice a day everyday to avoid constipation.  These medications are available over the counter.  Of course, if you have diarrhea, stop taking stool softeners.  Drinking plenty of fluid, eating fruits and vegetable, and being active also reduces the risk of constipation.   If despite taking stool softeners, and you still have no bowel movement for 2 days or more than your normal bowel habit frequency, please take one of the following over the counter laxatives:  MiraLax, Milk of Magnesia or Mag Citrate everyday. The goal is to have at least one bowel movement every other day.   

## 2021-10-18 ENCOUNTER — Encounter (HOSPITAL_COMMUNITY): Payer: Self-pay | Admitting: Emergency Medicine

## 2021-10-18 ENCOUNTER — Other Ambulatory Visit: Payer: Self-pay

## 2021-10-18 ENCOUNTER — Emergency Department (HOSPITAL_COMMUNITY)
Admission: EM | Admit: 2021-10-18 | Discharge: 2021-10-18 | Disposition: A | Discharge status: Home / Self Care | Payer: Medicare Other | Attending: Emergency Medicine | Admitting: Emergency Medicine

## 2021-10-18 ENCOUNTER — Emergency Department (HOSPITAL_COMMUNITY): Payer: Medicare Other

## 2021-10-18 DIAGNOSIS — R109 Unspecified abdominal pain: Secondary | ICD-10-CM | POA: Diagnosis not present

## 2021-10-18 DIAGNOSIS — K5900 Constipation, unspecified: Secondary | ICD-10-CM | POA: Insufficient documentation

## 2021-10-18 DIAGNOSIS — K6289 Other specified diseases of anus and rectum: Secondary | ICD-10-CM | POA: Insufficient documentation

## 2021-10-18 DIAGNOSIS — Z8507 Personal history of malignant neoplasm of pancreas: Secondary | ICD-10-CM | POA: Diagnosis not present

## 2021-10-18 LAB — COMPREHENSIVE METABOLIC PANEL
ALT: 30 U/L (ref 0–44)
AST: 23 U/L (ref 15–41)
Albumin: 3.4 g/dL — ABNORMAL LOW (ref 3.5–5.0)
Alkaline Phosphatase: 60 U/L (ref 38–126)
Anion gap: 8 (ref 5–15)
BUN: 15 mg/dL (ref 8–23)
CO2: 27 mmol/L (ref 22–32)
Calcium: 9.3 mg/dL (ref 8.9–10.3)
Chloride: 99 mmol/L (ref 98–111)
Creatinine, Ser: 0.86 mg/dL (ref 0.44–1.00)
GFR, Estimated: >60 mL/min (ref >60)
Glucose, Bld: 153 mg/dL — ABNORMAL HIGH (ref 70–99)
Potassium: 3.7 mmol/L (ref 3.5–5.1)
Sodium: 134 mmol/L — ABNORMAL LOW (ref 135–145)
Total Bilirubin: 1.8 mg/dL — ABNORMAL HIGH (ref 0.3–1.2)
Total Protein: 7.4 g/dL (ref 6.5–8.1)

## 2021-10-18 LAB — CBC WITH DIFFERENTIAL/PLATELET
Abs Immature Granulocytes: 0.14 10*3/uL — ABNORMAL HIGH (ref 0.00–0.07)
Basophils Absolute: 0 10*3/uL (ref 0.0–0.1)
Basophils Relative: 0 %
Eosinophils Absolute: 0 10*3/uL (ref 0.0–0.5)
Eosinophils Relative: 0 %
HCT: 33.5 % — ABNORMAL LOW (ref 36.0–46.0)
Hemoglobin: 10.9 g/dL — ABNORMAL LOW (ref 12.0–15.0)
Immature Granulocytes: 1 %
Lymphocytes Relative: 2 %
Lymphs Abs: 0.2 10*3/uL — ABNORMAL LOW (ref 0.7–4.0)
MCH: 29.5 pg (ref 26.0–34.0)
MCHC: 32.5 g/dL (ref 30.0–36.0)
MCV: 90.5 fL (ref 80.0–100.0)
Monocytes Absolute: 0.1 10*3/uL (ref 0.1–1.0)
Monocytes Relative: 1 %
Neutro Abs: 14.5 10*3/uL — ABNORMAL HIGH (ref 1.7–7.7)
Neutrophils Relative %: 96 %
Platelets: 216 10*3/uL (ref 150–400)
RBC: 3.7 MIL/uL — ABNORMAL LOW (ref 3.87–5.11)
RDW: 14.8 % (ref 11.5–15.5)
WBC: 15 10*3/uL — ABNORMAL HIGH (ref 4.0–10.5)
nRBC: 0 % (ref 0.0–0.2)

## 2021-10-18 LAB — URINALYSIS, ROUTINE W REFLEX MICROSCOPIC
Bilirubin Urine: NEGATIVE
Glucose, UA: NEGATIVE mg/dL
Hgb urine dipstick: NEGATIVE
Ketones, ur: NEGATIVE mg/dL
Nitrite: NEGATIVE
Protein, ur: 30 mg/dL — AB
Specific Gravity, Urine: 1.02 (ref 1.005–1.030)
pH: 5 (ref 5.0–8.0)

## 2021-10-18 LAB — LIPASE, BLOOD: Lipase: 22 U/L (ref 11–51)

## 2021-10-18 MED ORDER — FENTANYL CITRATE PF 50 MCG/ML IJ SOSY
50.0000 ug | PREFILLED_SYRINGE | Freq: Once | INTRAMUSCULAR | Status: AC
  Administered 2021-10-18: 50 ug via INTRAVENOUS
  Filled 2021-10-18: qty 1

## 2021-10-18 MED ORDER — MAGNESIUM CITRATE PO SOLN: 296.0000 mL | Freq: Once | ORAL | 0 refills | Status: AC

## 2021-10-18 MED ORDER — ONDANSETRON HCL 4 MG/2ML IJ SOLN
4.0000 mg | Freq: Once | INTRAMUSCULAR | Status: AC
  Administered 2021-10-18: 4 mg via INTRAVENOUS
  Filled 2021-10-18: qty 2

## 2021-10-18 MED ORDER — IOHEXOL 300 MG/ML  SOLN
100.0000 mL | Freq: Once | INTRAMUSCULAR | Status: AC | PRN
  Administered 2021-10-18: 100 mL via INTRAVENOUS

## 2021-10-18 MED ORDER — DICYCLOMINE HCL 20 MG PO TABS: 20.0000 mg | ORAL_TABLET | Freq: Two times a day (BID) | ORAL | 0 refills | Status: DC

## 2021-10-18 NOTE — ED Provider Triage Note (Signed)
Emergency Medicine Provider Triage Evaluation Note ? ?Page Spiro , a 86 y.o. female  was evaluated in triage.  Pt complains of constipation and abdominal pain.  Currently being treated for cancer.  Received chemotherapy on Monday, has been unable to pass sufficient stool since then.  Abdominal pain started last night of both lower quadrants, suprapubic region, and around her rectum.  Denies fever, nausea, vomiting, diarrhea.  Denies urinary symptoms, flank pain, back pain.  Has port placed. ? ?Review of Systems  ?Positive: As above ?Negative: As above ? ?Physical Exam  ?BP 117/71 (BP Location: Left Arm)   Pulse (!) 115   Temp 98.6 ?F (37 ?C) (Oral)   Resp 16   SpO2 93%  ?Gen:   Awake, no distress   ?Resp:  Normal effort CTAB ?MSK:   Moves extremities without difficulty  ?Other:  Diffuse abdominal tenderness.  Tachycardic with a heart rate of 115 bpm. ? ?Medical Decision Making  ?Medically screening exam initiated at 12:21 PM.  Appropriate orders placed.  Lincy Belles was informed that the remainder of the evaluation will be completed by another provider, this initial triage assessment does not replace that evaluation, and the importance of remaining in the ED until their evaluation is complete. ? ?Labs ordered ?  ?[place], PA-C ?93/90/30 1309 ? ?

## 2021-10-18 NOTE — Discharge Instructions (Addendum)
Get help right away if: ?You have a fever and your symptoms suddenly get worse. ?You leak stool or have blood in your stool. ?Your abdomen is bloated. ?You have severe pain in your abdomen. ?You feel dizzy or you faint. ?

## 2021-10-18 NOTE — ED Triage Notes (Signed)
Pt reports constipation x3days. Pt states she is getting chemo treatments and has been constipated. Pt reports trying OTC w/ no relief.  ?

## 2021-10-18 NOTE — ED Provider Notes (Signed)
?[facility] DEPT ?Provider Note ? ? ?CSN: [id number] ?Arrival date & time: 10/18/21  1138 ? ?  ? ?History ? ?Chief Complaint  ?Patient presents with  ? Constipation  ? ? ?Kathryn Kent is an 86 y/o F with PMH of pancreatic cancer and cancer related pain treated with opioids who presents with 3 days of constipation. Pt states she has the urgency to have a bowel movement but is unable to pass stool, however this morning she was able to pass a small marble-sized stool. Pt has tried Miralax , senna, and Colace with no relief. She gave herself 2 fleets enema's at home without improvent. Pt states she is still able to pass flatus. Denies passing liquid, BRBPR. Pt states she is having associated intermittent abdominal cramping and rectal pain. Denies fever, chills, sweats, fatigue. Denies chest pain, SHOB, cough. Denies urinary changes. Denies prior episodes similar to this. ? ? ?Constipation ? ?  ? ?Home Medications ?Prior to Admission medications   ?Medication Sig Start Date End Date Taking? Authorizing Provider  ?acetaminophen (TYLENOL) 500 MG tablet Take 1,000 mg by mouth every 6 (six) hours as needed for mild pain.    [provider]  ?amLODipine (NORVASC) 10 MG tablet Take 10 mg by mouth daily.     [provider]  ?chlorthalidone (HYGROTON) 25 MG tablet Take 0.5 tablets by mouth daily.  03/12/16   [provider]  ?ezetimibe (ZETIA) 10 MG tablet Take 10 mg by mouth daily. 10/17/20   [provider]  ?ibuprofen (ADVIL) 600 MG tablet Take 1 tablet (600 mg total) by mouth every 8 (eight) hours as needed. 12/30/18   Megan Salon, MD  ?irbesartan (AVAPRO) 300 MG tablet Take 1 tablet by mouth daily.  10/16/17   [provider]  ?lidocaine-prilocaine (EMLA) cream Apply to affected area once 09/15/21   Truitt Merle, MD  ?montelukast (SINGULAIR) 10 MG tablet Take 1 tablet by mouth at bedtime. 12/09/18   [provider]  ?Multiple Vitamin  (MULTIVITAMIN) tablet Take 1 tablet by mouth daily.    [provider]  ?ondansetron (ZOFRAN) 8 MG tablet Take 1 tablet (8 mg total) by mouth 2 (two) times daily as needed (Nausea or vomiting). 09/15/21   Truitt Merle, MD  ?potassium chloride (KLOR-CON M) 10 MEQ tablet Take 1 tablet (10 mEq total) by mouth once for 1 dose. 10/03/21 10/03/21  Truitt Merle, MD  ?prochlorperazine (COMPAZINE) 10 MG tablet Take 1 tablet (10 mg total) by mouth every 6 (six) hours as needed (Nausea or vomiting). 09/15/21   Truitt Merle, MD  ?simvastatin (ZOCOR) 40 MG tablet Take 20 mg by mouth 2 (two) times daily.     [provider]  ?traMADol (ULTRAM) 50 MG tablet Take 1 tablet (50 mg total) by mouth every 6 (six) hours as needed. 10/02/21   Truitt Merle, MD  ?   ? ?Allergies    ?Lipitor [atorvastatin]   ? ?Review of Systems   ?Review of Systems  ?Gastrointestinal:  Positive for constipation.  ? ?Physical Exam ?Updated Vital Signs ?BP 123/73   Pulse (!) 106   Temp 98.6 ?F (37 ?C) (Oral)   Resp 18   SpO2 97%  ?Physical Exam ?Vitals and nursing note reviewed.  ?Constitutional:   ?   General: She is not in acute distress. ?   Appearance: She is well-developed. She is not diaphoretic.  ?HENT:  ?   Head: Normocephalic and atraumatic.  ?   Right Ear:  External ear normal.  ?   Left Ear: External ear normal.  ?   Nose: Nose normal.  ?   Mouth/Throat:  ?   Mouth: Mucous membranes are moist.  ?Eyes:  ?   General: No scleral icterus. ?   Conjunctiva/sclera: Conjunctivae normal.  ?Cardiovascular:  ?   Rate and Rhythm: Normal rate and regular rhythm.  ?   Heart sounds: Normal heart sounds. No murmur heard. ?  No friction rub. No gallop.  ?Pulmonary:  ?   Effort: Pulmonary effort is normal. No respiratory distress.  ?   Breath sounds: Normal breath sounds.  ?Abdominal:  ?   General: Bowel sounds are normal. There is no distension.  ?   Palpations: Abdomen is soft. There is no mass.  ?   Tenderness: There is abdominal tenderness in the right lower  quadrant and left lower quadrant. There is no guarding or rebound.  ? ? ?Genitourinary: ?   Comments: Digital Rectal Exam reveals sphincter with good tone. No external hemorrhoids. No masses or fissures. Stool color is brown with no overt blood. ? ?Musculoskeletal:  ?   Cervical back: Normal range of motion.  ?Skin: ?   General: Skin is warm and dry.  ?Neurological:  ?   Mental Status: She is alert and oriented to person, place, and time.  ?Psychiatric:     ?   Behavior: Behavior normal.  ? ? ?ED Results / Procedures / Treatments   ?Labs ?(all labs ordered are listed, but only abnormal results are displayed) ?Labs Reviewed  ?CBC WITH DIFFERENTIAL/PLATELET - Abnormal; Notable for the following components:  ?    Result Value  ? WBC 15.0 (*)   ? RBC 3.70 (*)   ? Hemoglobin 10.9 (*)   ? HCT 33.5 (*)   ? Neutro Abs 14.5 (*)   ? Lymphs Abs 0.2 (*)   ? Abs Immature Granulocytes 0.14 (*)   ? All other components within normal limits  ?COMPREHENSIVE METABOLIC PANEL - Abnormal; Notable for the following components:  ? Sodium 134 (*)   ? Glucose, Bld 153 (*)   ? Albumin 3.4 (*)   ? Total Bilirubin 1.8 (*)   ? All other components within normal limits  ?URINALYSIS, ROUTINE W REFLEX MICROSCOPIC - Abnormal; Notable for the following components:  ? Color, Urine AMBER (*)   ? Protein, ur 30 (*)   ? Leukocytes,Ua SMALL (*)   ? Bacteria, UA RARE (*)   ? All other components within normal limits  ?LIPASE, BLOOD  ? ? ?EKG ?None ? ?Radiology ?No results found. ? ?Procedures ?Procedures  ? ? ?Medications Ordered in ED ?Medications  ?fentaNYL (SUBLIMAZE) injection 50 mcg (50 mcg Intravenous Given 10/18/21 1408)  ?ondansetron (ZOFRAN) injection 4 mg (4 mg Intravenous Given 10/18/21 1409)  ? ? ?ED Course/ Medical Decision Making/ A&P ?  ?                        ?Medical Decision Making ?86 year old female here with complaint of constipation.  She is on tramadol, ondansetron and has a history of pancreatic cancer.  Labs show no acute findings.   I ordered and personally visualized the CT abdomen and pelvis which shows a large stool burden and otherwise chronic findings associated with her pancreatic cancer.  Patient does not appear to have fecal impaction.  Will try a bowel regimen.  Patient vies to follow-up with her PCP. ? ?Problems Addressed: ?Constipation, unspecified constipation type:  acute illness or injury ? ?Amount and/or Complexity of Data Reviewed ?Radiology: ordered. ? ?Risk ?OTC drugs. ?Prescription drug management. ? ? ? ?Final Clinical Impression(s) / ED Diagnoses ?Final diagnoses:  ?Constipation, unspecified constipation type  ? ? ?Rx / DC Orders ?ED Discharge Orders   ? ? None  ? ?  ? ? ?  ?Margarita Mail, PA-C ?10/18/21 1646 ? ?  ?Sherwood Gambler, MD ?10/19/21 [phone] ? ?

## 2021-10-30 ENCOUNTER — Inpatient Hospital Stay: Payer: Medicare Other | Admitting: Hematology

## 2021-10-30 ENCOUNTER — Inpatient Hospital Stay: Payer: Medicare Other | Admitting: Nutrition

## 2021-10-30 ENCOUNTER — Encounter: Payer: Self-pay | Admitting: Hematology

## 2021-10-30 ENCOUNTER — Inpatient Hospital Stay: Payer: Medicare Other

## 2021-10-30 ENCOUNTER — Other Ambulatory Visit: Payer: Self-pay

## 2021-10-30 ENCOUNTER — Inpatient Hospital Stay: Payer: Medicare Other | Attending: Physician Assistant

## 2021-10-30 VITALS — BP 132/92 | HR 99 | Temp 98.2°F | Resp 18 | Ht 64.0 in | Wt 179.5 lb

## 2021-10-30 DIAGNOSIS — C252 Malignant neoplasm of tail of pancreas: Secondary | ICD-10-CM

## 2021-10-30 DIAGNOSIS — C7802 Secondary malignant neoplasm of left lung: Secondary | ICD-10-CM | POA: Diagnosis not present

## 2021-10-30 DIAGNOSIS — C7801 Secondary malignant neoplasm of right lung: Secondary | ICD-10-CM | POA: Insufficient documentation

## 2021-10-30 DIAGNOSIS — Z5111 Encounter for antineoplastic chemotherapy: Secondary | ICD-10-CM | POA: Diagnosis present

## 2021-10-30 DIAGNOSIS — C786 Secondary malignant neoplasm of retroperitoneum and peritoneum: Secondary | ICD-10-CM | POA: Diagnosis not present

## 2021-10-30 DIAGNOSIS — E876 Hypokalemia: Secondary | ICD-10-CM

## 2021-10-30 LAB — COMPREHENSIVE METABOLIC PANEL
ALT: 25 U/L (ref 0–44)
AST: 17 U/L (ref 15–41)
Albumin: 3.8 g/dL (ref 3.5–5.0)
Alkaline Phosphatase: 74 U/L (ref 38–126)
Anion gap: 7 (ref 5–15)
BUN: 14 mg/dL (ref 8–23)
CO2: 28 mmol/L (ref 22–32)
Calcium: 9.7 mg/dL (ref 8.9–10.3)
Chloride: 100 mmol/L (ref 98–111)
Creatinine, Ser: 0.72 mg/dL (ref 0.44–1.00)
GFR, Estimated: >60 mL/min (ref >60)
Glucose, Bld: 154 mg/dL — ABNORMAL HIGH (ref 70–99)
Potassium: 3.4 mmol/L — ABNORMAL LOW (ref 3.5–5.1)
Sodium: 135 mmol/L (ref 135–145)
Total Bilirubin: 0.7 mg/dL (ref 0.3–1.2)
Total Protein: 7.7 g/dL (ref 6.5–8.1)

## 2021-10-30 LAB — CBC WITH DIFFERENTIAL/PLATELET
Abs Immature Granulocytes: 0.05 10*3/uL (ref 0.00–0.07)
Basophils Absolute: 0 10*3/uL (ref 0.0–0.1)
Basophils Relative: 1 %
Eosinophils Absolute: 0.1 10*3/uL (ref 0.0–0.5)
Eosinophils Relative: 1 %
HCT: 36.4 % (ref 36.0–46.0)
Hemoglobin: 11.9 g/dL — ABNORMAL LOW (ref 12.0–15.0)
Immature Granulocytes: 1 %
Lymphocytes Relative: 18 %
Lymphs Abs: 1.2 10*3/uL (ref 0.7–4.0)
MCH: 29 pg (ref 26.0–34.0)
MCHC: 32.7 g/dL (ref 30.0–36.0)
MCV: 88.8 fL (ref 80.0–100.0)
Monocytes Absolute: 0.6 10*3/uL (ref 0.1–1.0)
Monocytes Relative: 10 %
Neutro Abs: 4.4 10*3/uL (ref 1.7–7.7)
Neutrophils Relative %: 69 %
Platelets: 184 10*3/uL (ref 150–400)
RBC: 4.1 MIL/uL (ref 3.87–5.11)
RDW: 15.1 % (ref 11.5–15.5)
WBC: 6.4 10*3/uL (ref 4.0–10.5)
nRBC: 0 % (ref 0.0–0.2)

## 2021-10-30 MED ORDER — PACLITAXEL PROTEIN-BOUND CHEMO INJECTION 100 MG
100.0000 mg/m2 | Freq: Once | INTRAVENOUS | Status: AC
  Administered 2021-10-30: 200 mg via INTRAVENOUS
  Filled 2021-10-30: qty 40

## 2021-10-30 MED ORDER — HEPARIN SOD (PORK) LOCK FLUSH 100 UNIT/ML IV SOLN
500.0000 [IU] | Freq: Once | INTRAVENOUS | Status: AC | PRN
  Administered 2021-10-30: 500 [IU]

## 2021-10-30 MED ORDER — SODIUM CHLORIDE 0.9% FLUSH
10.0000 mL | INTRAVENOUS | Status: DC | PRN
  Administered 2021-10-30: 10 mL

## 2021-10-30 MED ORDER — SODIUM CHLORIDE 0.9 % IV SOLN
800.0000 mg/m2 | Freq: Once | INTRAVENOUS | Status: AC
  Administered 2021-10-30: 1596 mg via INTRAVENOUS
  Filled 2021-10-30: qty 41.98

## 2021-10-30 MED ORDER — POTASSIUM CHLORIDE CRYS ER 10 MEQ PO TBCR
10.0000 meq | EXTENDED_RELEASE_TABLET | Freq: Once | ORAL | 1 refills | Status: DC
Start: 2021-10-30 — End: 2021-10-30

## 2021-10-30 MED ORDER — SODIUM CHLORIDE 0.9 % IV SOLN: Freq: Once | INTRAVENOUS | Status: AC

## 2021-10-30 MED ORDER — PROCHLORPERAZINE MALEATE 10 MG PO TABS
10.0000 mg | ORAL_TABLET | Freq: Once | ORAL | Status: AC
  Administered 2021-10-30: 10 mg via ORAL
  Filled 2021-10-30: qty 1

## 2021-10-30 MED ORDER — POTASSIUM CHLORIDE CRYS ER 10 MEQ PO TBCR: 10.0000 meq | EXTENDED_RELEASE_TABLET | Freq: Every day | ORAL | 1 refills | Status: DC

## 2021-10-30 NOTE — Progress Notes (Signed)
?Kathryn Kent   ?[phone]) [phone] [phone]   ?Clinic Follow up Note  ? ?Patient Care Team: ?Crist Infante, MD as PCP - General (Internal Medicine) ?Lorretta Harp, MD as PCP - Cardiology (Cardiology) ?Truitt Merle, MD as Consulting Physician (Oncology) ?Earl Gala, Deliah Goody, RN as Sales executive (Oncology) ? ?Date of Service:  10/30/2021 ? ?CHIEF COMPLAINT: f/u of metastatic pancreatic cancer ? ?CURRENT THERAPY:  ?Gemcitabine, started 09/25/21 ?            -Abraxane added with C2 (10/16/21) ? ?ASSESSMENT & PLAN:  ?Kathryn Kent is a 85 y.o. female with  ? ?1. Metastatic Pancreatic Cancer to peritoneum and lungs, stage IV  ?-presented to PCP with rectal, vaginal, and pelvic area pain, as well as positive Hemoccult stool test. CT AP on 08/30/21 showed 4.7 cm pancreatic tail mass extending into splenic hilum and suspicious for involvement of splenic parenchyma and abutment with possible invasion of colonic splenic flexure; extensive omental and peritoneal nodularity; right cardiophrenic adenopathy and right-sided pulmonary nodules. ?-baseline tumor markers on 09/04/21 were all elevated-- CEA of 24.71, CA 125 of 67.7, and CA 19.9 of 1,208. ?-staging chest CT on 09/06/21 showed: nodularity and thickening throughout right lung; bilateral pulmonary nodules in lower lungs, R>L, up to 11 mm; small right pleural effusion; enlarged right cardiophrenic lymph nodes. ?-biopsy of omental mass on 09/12/21 confirmed metastatic adenocarcinoma, compatible with pancreatic primary based on imaging. ?-she began first cycle chemo with single agent gemcitabine on 09/25/21. She tolerated well overall. Abraxane was added with cycle 2, she did well except constipation. She is now receiving treatment every 2 weeks. ?-labs reviewed, overall stable, hgb improved to 11.9. adequate for treatment, will proceed  ?  ?2. Weight Loss, Constipation, Pain ?-secondary to #1 ?-chart review shows weight loss of about 20 lbs from  10/2020 - 08/2021. ?-she met with dietician on 09/25/21. Pamala Hurry will see her in infusion today (10/30/21) ?-she is on tramadol for abdominal, armpit, and groin pains. ?-she was prescribed bentyl in the ED on 10/18/21 for constipation. ?-we again discussed constipation management. I advised that constipation will likely reoccur due to her pain medications. I recommend she take Miralax daily and to increase her dose as needed. I discussed she may need to back down her dose if she experiences loose bowel movements. ?  ?3. Thyroid Nodule ?-incidental subcentimeter left thyroid nodule with heterogeneous and enlarged thyroid on chest CT 09/06/21. ?  ?4. Goal of care discussion ?-we previously discussed the incurable nature of her cancer, and the overall poor prognosis, especially if she does not have good response to chemotherapy or progress on chemo ?-The patient understands the goal of care is palliative. ?-she is full code now  ?  ?  ?PLAN: ?-proceed with C2D15 gemcitabine and abraxane today at same dose ?-constipation management again reviewed, advised her to use miralax daily or more  ?-I refilled her KCL ?-lab, f/u, and C3D1 gem/abraxane in 2 and 4 weeks ? ? ?No problem-specific Assessment & Plan notes found for this encounter. ? ? ?SUMMARY OF ONCOLOGIC HISTORY: ?Oncology History  ?Pancreatic cancer [facility])  ?08/30/2021 Imaging  ? EXAM: ?CT ABDOMEN AND PELVIS WITH CONTRAST ? ?IMPRESSION: ?1. Infiltrative mass in the pancreatic tail which measures 4.7 x 4.4 ?cm, highly suspicious for pancreatic adenocarcinoma. ?2. The mass extends into the splenic hilum with findings suspicious ?for involvement of the splenic parenchyma as well as abutment with possible invasion of the colonic splenic flexure. ?3. Extensive omental and  peritoneal nodularity with trace ?abdominopelvic free fluid, most consistent with carcinomatosis. ?4. Right cardiophrenic adenopathy and right sided pulmonary nodules measuring up to 11 mm, concerning for  metastatic disease. ?5. Central filling defect within an enlarged left common femoral ?vein, consistent with nonocclusive deep venous thrombosis. ?6. Symmetric wall thickening of the distal esophagus with reflux ?versus retract retained contrast in the distal esophagus. Correlate ?for esophagitis. ?7. Hypodense hepatic lesions measure up to 1 cm in the right lower lobe appear to have been present on prior non contrasted CT and likely reflect cysts. No new discrete hepatic lesion identified.  ?8. Lobular uterine contour with numerous dystrophic calcifications ?likely reflecting uterine leiomyomas. ?9.  Aortic Atherosclerosis (ICD10-I70.0). ?  ?09/06/2021 Imaging  ? EXAM: ?CT CHEST WITH CONTRAST ? ?IMPRESSION: ?1. Fissural nodularity and fissural thickening throughout the right ?lung concerning for metastatic disease. ?  ?2. Additionally there are bilateral pulmonary nodules in the lower ?lungs, right greater than left, measuring up to 11 mm suspicious for ?metastatic disease. ?  ?3.  Small right pleural effusion. ?  ?4. Enlarged right cardiophrenic lymph nodes suspicious for ?metastatic disease. ?  ?5. Subcentimeter incidental left thyroid nodule with heterogeneous ?and enlarged thyroid. Recommend thyroid US. Reference: J Am Coll Radiol. 2015 Feb;12(2): 143-50 ?  ?4. Pancreatic mass, peritoneal metastases and hypodense liver ?lesions are unchanged from 08/30/2021. ?  ?09/12/2021 Initial Biopsy  ? FINAL MICROSCOPIC DIAGNOSIS:  ? ?A.   OMENTAL MASS, NEEDLE CORE BIOPSY:  ?-    Metastatic adenocarcinoma, see Comment.  ? ?COMMENT:  ?The adenocarcinoma was interrogated with immunohistochemical (IHC) stains (CK7, CK20, PAX8, CDX-2, GATA-3). The malignant cells are CK7 positive, CK20 negative and have weak CDX-2 reactivity. The tumor cells are negative for GATA-3 (breast marker) and have weak/equivocal nuclear reactivity with PAX8.  This immunophenotype is non-specific, but is compatible with a pancreatic ductal primary, which  is the favored primary site based on imaging studies. The controls are satisfactory.  ?  ?09/15/2021 Initial Diagnosis  ? Pancreatic cancer [facility]) ?  ?09/25/2021 -  Chemotherapy  ? Patient is on Treatment Plan : PANCREATIC Abraxane / Gemcitabine D1,8, q21d  ? ?  ?  ? Genetic Testing  ? Negative genetic testing. No pathogenic variants identified on the [facility] BRCA1/2 STAT+ CustomNext+RNA panel. The report date is 10/02/2021. ? ?The CustomNext-Cancer+RNAinsight panel offered by [facility] includes sequencing and rearrangement analysis for the following 47 genes:  APC, ATM, AXIN2, BARD1, BMPR1A, BRCA1, BRCA2, BRIP1, CDH1, CDK4, CDKN2A, CHEK2, DICER1, EPCAM, GREM1, HOXB13, MEN1, MLH1, MSH2, MSH3, MSH6, MUTYH, NBN, NF1, NF2, NTHL1, PALB2, PMS2, POLD1, POLE, PTEN, RAD51C, RAD51D, RECQL, RET, SDHA, SDHAF2, SDHB, SDHC, SDHD, SMAD4, SMARCA4, STK11, TP53, TSC1, TSC2, and VHL.  RNA data is routinely analyzed for use in variant interpretation for all genes. ? ?  ? ? ? ?INTERVAL HISTORY:  ?Kathryn Kent is here for a follow up of metastatic pancreatic cancer. She was last seen by PA Cassie on 10/16/21. She presents to the clinic accompanied by a family member. ?She reports her pain medications caused constipation, leading her to present to the ED. She notes they gave her laxatives in the ED. Aside from the constipation, she reports she tolerated treatment well. She does note an elevated pulse in the last week or so. She reports associated palpitations and lightheadedness but denies chest pains. She adds that intermittent elevated pulse has happened to her before. ?She also reports low appetite. She endorses using two Ensure a day. She notes her appetite  has improved over the last 4 days. ?  ?All other systems were reviewed with the patient and are negative. ? ?MEDICAL HISTORY:  ?Past Medical History:  ?Diagnosis Date  ? Anemia   ? Coronary artery disease cardiologist---dr berry  ? cardiac cath --   10/16/05 revealing  noncritical CAD  ? Cyst of left kidney   ? small  ? Diverticulosis   ? Heart murmur   ? History of colon polyps   ? History of kidney stones   ? Hyperlipidemia   ? Hypertension   ? Leiomyoma of uterus   ? Liver cys

## 2021-10-30 NOTE — Patient Instructions (Signed)
[facility]  Discharge Instructions: ?Thank you for choosing [facility] to provide your oncology and hematology care.  ? ?If you have a lab appointment with the [facility], please go directly to the [facility] and check in at the registration area. ?  ?Wear comfortable clothing and clothing appropriate for easy access to any Portacath or PICC line.  ? ?We strive to give you quality time with your provider. You may need to reschedule your appointment if you arrive late (15 or more minutes).  Arriving late affects you and other patients whose appointments are after yours.  Also, if you miss three or more appointments without notifying the office, you may be dismissed from the clinic at the provider?s discretion.    ?  ?For prescription refill requests, have your pharmacy contact our office and allow 72 hours for refills to be completed.   ? ?Today you received the following chemotherapy and/or immunotherapy agents: Abraxane, Gemzar.     ?  ?To help prevent nausea and vomiting after your treatment, we encourage you to take your nausea medication as directed. ? ?BELOW ARE SYMPTOMS THAT SHOULD BE REPORTED IMMEDIATELY: ?*FEVER GREATER THAN 100.4 F (38 ?C) OR HIGHER ?*CHILLS OR SWEATING ?*NAUSEA AND VOMITING THAT IS NOT CONTROLLED WITH YOUR NAUSEA MEDICATION ?*UNUSUAL SHORTNESS OF BREATH ?*UNUSUAL BRUISING OR BLEEDING ?*URINARY PROBLEMS (pain or burning when urinating, or frequent urination) ?*BOWEL PROBLEMS (unusual diarrhea, constipation, pain near the anus) ?TENDERNESS IN MOUTH AND THROAT WITH OR WITHOUT PRESENCE OF ULCERS (sore throat, sores in mouth, or a toothache) ?UNUSUAL RASH, SWELLING OR PAIN  ?UNUSUAL VAGINAL DISCHARGE OR ITCHING  ? ?Items with * indicate a potential emergency and should be followed up as soon as possible or go to the Emergency Department if any problems should occur. ? ?Please show the CHEMOTHERAPY ALERT CARD or IMMUNOTHERAPY ALERT CARD at  check-in to the Emergency Department and triage nurse. ? ?Should you have questions after your visit or need to cancel or reschedule your appointment, please contact [facility]  Dept: [phone]  and follow the prompts.  Office hours are 8:00 a.m. to 4:30 p.m. Monday - Friday. Please note that voicemails left after 4:00 p.m. may not be returned until the following business day.  We are closed weekends and major holidays. You have access to a nurse at all times for urgent questions. Please call the main number to the clinic Dept: [phone] and follow the prompts. ? ? ?For any non-urgent questions, you may also contact your provider using MyChart. We now offer e-Visits for anyone 32 and older to request care online for non-urgent symptoms. For details visit [email or web address]. ?  ?Also download the MyChart app! Go to the app store, search "MyChart", open the app, select [facility], and log in with your MyChart username and password. ? ?Due to Covid, a mask is required upon entering the hospital/clinic. If you do not have a mask, one will be given to you upon arrival. For doctor visits, patients may have 1 support person aged 76 or older with them. For treatment visits, patients cannot have anyone with them due to current Covid guidelines and our immunocompromised population.  ? ?

## 2021-10-30 NOTE — Progress Notes (Signed)
Nutrition follow-up completed with patient in infusion for metastatic pancreatic cancer. ? ?Weight documented as 179.5 pounds May 8.  This is decreased from 186 pounds 8 ounces April 3. ?Noted labs: Potassium 3.4 and glucose 154. ? ?Patient reports her appetite is somewhat decreased.  She is drinking 2 Ensure max protein shakes daily providing 60 g of protein but only 300 cal.  Complains of some constipation.  She has discussed with physician who has encouraged her to take MiraLAX once daily.  She states she continues to have nausea.  States she has not been taking her nausea medications because she knows they are constipating. ? ?Nutrition diagnosis: Unintended weight loss continues. ? ?Intervention: ?Educated patient on foods to improve constipation.  Encouraged increased fluid intake.  Reinforced importance of taking MiraLAX as physician has recommended. ?Increase water intake throughout the day.  Goal of a minimum of 64 ounces. ?Change Ensure max to ensure plus high-protein or equivalent.  Increase to 3 cartons daily.  Provided coupons. ?Resume nausea medications to control nausea. ? ?Monitoring, evaluation, goals: ?Patient will work to increase calories and protein to minimize weight loss. ? ?Next visit: Tuesday, June 6 during infusion. ? ?**Disclaimer: This note was dictated with voice recognition software. Similar sounding words can inadvertently be transcribed and this note may contain transcription errors which may not have been corrected upon publication of note.** ? ?

## 2021-10-30 NOTE — Patient Instructions (Signed)

## 2021-10-31 LAB — CANCER ANTIGEN 19-9: CA 19-9: 2155 U/mL — ABNORMAL HIGH (ref 0–35)

## 2021-11-03 ENCOUNTER — Encounter: Payer: Self-pay | Admitting: Hematology

## 2021-11-03 NOTE — Progress Notes (Signed)
Patient's niece Nola left voicemail regarding a grant expense submitted. ? ?Made several attempts to reach niece back on both her number and patient number. Unable to leave message on either number. ? ? ?

## 2021-11-09 ENCOUNTER — Telehealth: Payer: Self-pay | Admitting: Hematology

## 2021-11-09 NOTE — Telephone Encounter (Signed)
Unable to leave message with rescheduled upcoming appointment due to provider's PAL. Mailed calendar. 

## 2021-11-13 ENCOUNTER — Inpatient Hospital Stay: Payer: Medicare Other

## 2021-11-13 ENCOUNTER — Encounter: Payer: Self-pay | Admitting: Hematology

## 2021-11-13 ENCOUNTER — Inpatient Hospital Stay: Payer: Medicare Other | Admitting: Hematology

## 2021-11-13 ENCOUNTER — Inpatient Hospital Stay (HOSPITAL_BASED_OUTPATIENT_CLINIC_OR_DEPARTMENT_OTHER): Payer: Medicare Other

## 2021-11-13 ENCOUNTER — Other Ambulatory Visit: Payer: Self-pay

## 2021-11-13 VITALS — BP 140/82 | HR 94 | Temp 98.0°F | Resp 18 | Ht 64.0 in | Wt 179.6 lb

## 2021-11-13 DIAGNOSIS — C252 Malignant neoplasm of tail of pancreas: Secondary | ICD-10-CM

## 2021-11-13 DIAGNOSIS — Z5111 Encounter for antineoplastic chemotherapy: Secondary | ICD-10-CM | POA: Diagnosis not present

## 2021-11-13 DIAGNOSIS — Z95828 Presence of other vascular implants and grafts: Secondary | ICD-10-CM

## 2021-11-13 LAB — COMPREHENSIVE METABOLIC PANEL
ALT: 28 U/L (ref 0–44)
AST: 19 U/L (ref 15–41)
Albumin: 3.7 g/dL (ref 3.5–5.0)
Alkaline Phosphatase: 76 U/L (ref 38–126)
Anion gap: 8 (ref 5–15)
BUN: 19 mg/dL (ref 8–23)
CO2: 28 mmol/L (ref 22–32)
Calcium: 9.7 mg/dL (ref 8.9–10.3)
Chloride: 101 mmol/L (ref 98–111)
Creatinine, Ser: 0.69 mg/dL (ref 0.44–1.00)
GFR, Estimated: >60 mL/min (ref >60)
Glucose, Bld: 158 mg/dL — ABNORMAL HIGH (ref 70–99)
Potassium: 3.5 mmol/L (ref 3.5–5.1)
Sodium: 137 mmol/L (ref 135–145)
Total Bilirubin: 0.6 mg/dL (ref 0.3–1.2)
Total Protein: 7.6 g/dL (ref 6.5–8.1)

## 2021-11-13 LAB — CBC WITH DIFFERENTIAL/PLATELET
Abs Immature Granulocytes: 0.03 10*3/uL (ref 0.00–0.07)
Basophils Absolute: 0 10*3/uL (ref 0.0–0.1)
Basophils Relative: 1 %
Eosinophils Absolute: 0.1 10*3/uL (ref 0.0–0.5)
Eosinophils Relative: 1 %
HCT: 34.1 % — ABNORMAL LOW (ref 36.0–46.0)
Hemoglobin: 11.4 g/dL — ABNORMAL LOW (ref 12.0–15.0)
Immature Granulocytes: 1 %
Lymphocytes Relative: 21 %
Lymphs Abs: 1.2 10*3/uL (ref 0.7–4.0)
MCH: 29.8 pg (ref 26.0–34.0)
MCHC: 33.4 g/dL (ref 30.0–36.0)
MCV: 89.3 fL (ref 80.0–100.0)
Monocytes Absolute: 0.6 10*3/uL (ref 0.1–1.0)
Monocytes Relative: 11 %
Neutro Abs: 3.6 10*3/uL (ref 1.7–7.7)
Neutrophils Relative %: 65 %
Platelets: 176 10*3/uL (ref 150–400)
RBC: 3.82 MIL/uL — ABNORMAL LOW (ref 3.87–5.11)
RDW: 16.1 % — ABNORMAL HIGH (ref 11.5–15.5)
WBC: 5.6 10*3/uL (ref 4.0–10.5)
nRBC: 0 % (ref 0.0–0.2)

## 2021-11-13 MED ORDER — SODIUM CHLORIDE 0.9% FLUSH
10.0000 mL | INTRAVENOUS | Status: DC | PRN
  Administered 2021-11-13: 10 mL

## 2021-11-13 MED ORDER — PROCHLORPERAZINE MALEATE 10 MG PO TABS
10.0000 mg | ORAL_TABLET | Freq: Once | ORAL | Status: AC
  Administered 2021-11-13: 10 mg via ORAL
  Filled 2021-11-13: qty 1

## 2021-11-13 MED ORDER — SODIUM CHLORIDE 0.9% FLUSH
10.0000 mL | Freq: Once | INTRAVENOUS | Status: AC
  Administered 2021-11-13: 10 mL

## 2021-11-13 MED ORDER — HEPARIN SOD (PORK) LOCK FLUSH 100 UNIT/ML IV SOLN
500.0000 [IU] | Freq: Once | INTRAVENOUS | Status: AC | PRN
  Administered 2021-11-13: 500 [IU]

## 2021-11-13 MED ORDER — SODIUM CHLORIDE 0.9 % IV SOLN: Freq: Once | INTRAVENOUS | Status: AC

## 2021-11-13 MED ORDER — SODIUM CHLORIDE 0.9 % IV SOLN
800.0000 mg/m2 | Freq: Once | INTRAVENOUS | Status: AC
  Administered 2021-11-13: 1596 mg via INTRAVENOUS
  Filled 2021-11-13: qty 41.98

## 2021-11-13 MED ORDER — PACLITAXEL PROTEIN-BOUND CHEMO INJECTION 100 MG
100.0000 mg/m2 | Freq: Once | INTRAVENOUS | Status: AC
  Administered 2021-11-13: 200 mg via INTRAVENOUS
  Filled 2021-11-13: qty 40

## 2021-11-13 NOTE — Progress Notes (Signed)
Kathryn Kent   [phone] Fax:([phone]) [phone]   Clinic Follow up Note   Patient Care Team: Crist Infante, MD as PCP - General (Internal Medicine) Lorretta Harp, MD as PCP - Cardiology (Cardiology) Truitt Merle, MD as Consulting Physician (Oncology) Royston Bake, RN as Oncology Nurse Navigator (Oncology)  Date of Service:  11/13/2021  CHIEF COMPLAINT: f/u of metastatic pancreatic cancer  CURRENT THERAPY:  Gemcitabine, started 09/25/21             -Abraxane added with C2 (10/16/21)  ASSESSMENT & PLAN:  Kathryn Kent is a 86 y.o. female with   1. Metastatic Pancreatic Cancer to peritoneum and lungs, stage IV  -presented to PCP with rectal, vaginal, and pelvic area pain, as well as positive Hemoccult stool test. CT AP on 08/30/21 showed 4.7 cm pancreatic tail mass extending into splenic hilum and suspicious for involvement of splenic parenchyma and abutment with possible invasion of colonic splenic flexure; extensive omental and peritoneal nodularity; right cardiophrenic adenopathy and right-sided pulmonary nodules. -baseline tumor markers on 09/04/21 were all elevated-- CEA of 24.71, CA 125 of 67.7, and CA 19.9 of 1,208. -staging chest CT on 09/06/21 showed: nodularity and thickening throughout right lung; bilateral pulmonary nodules in lower lungs, R>L, up to 11 mm; small right pleural effusion; enlarged right cardiophrenic lymph nodes. -biopsy of omental mass on 09/12/21 confirmed metastatic adenocarcinoma, compatible with pancreatic primary based on imaging. -she began first cycle chemo with single agent gemcitabine on 09/25/21. She tolerated well overall. Abraxane was added with cycle 2, she did well except constipation. She is now receiving treatment every 2 weeks. She is tolerating very well and her abdominal pain has resolved  -However her tumor marker has been going up, plan to repeat CT in 8 weeks  -labs reviewed, overall stable, hgb stable at 11.4, adequate  for treatment, will proceed    2. Weight Loss, Constipation, Pain -secondary to #1 -chart review shows weight loss of about 20 lbs from 10/2020 - 08/2021. -she met with dietician on 09/25/21.  -she is on tramadol for abdominal, armpit, and groin pains. -she was prescribed bentyl in the ED on 10/18/21 for constipation. -we previously discussed constipation management.   3. Thyroid Nodule -incidental subcentimeter left thyroid nodule with heterogeneous and enlarged thyroid on chest CT 09/06/21.   4. Goal of care discussion -we previously discussed the incurable nature of her cancer, and the overall poor prognosis, especially if she does not have good response to chemotherapy or progress on chemo -The patient understands the goal of care is palliative. -she is full code now      PLAN: -proceed with C3D1 gemcitabine and abraxane today at same dose -lab, f/u, and gem/abraxane in 2 and 4 weeks -CT AP w contrast in 4 weeks  -we will fill out her form for her home personal care and transportation needs (she lives 1.5 hr away and lives alone)   No problem-specific Assessment & Plan notes found for this encounter.   SUMMARY OF ONCOLOGIC HISTORY: Oncology History  Pancreatic cancer ([place])  08/30/2021 Imaging   EXAM: CT ABDOMEN AND PELVIS WITH CONTRAST  IMPRESSION: 1. Infiltrative mass in the pancreatic tail which measures 4.7 x 4.4 cm, highly suspicious for pancreatic adenocarcinoma. 2. The mass extends into the splenic hilum with findings suspicious for involvement of the splenic parenchyma as well as abutment with possible invasion of the colonic splenic flexure. 3. Extensive omental and peritoneal nodularity with trace abdominopelvic free fluid, most  consistent with carcinomatosis. 4. Right cardiophrenic adenopathy and right sided pulmonary nodules measuring up to 11 mm, concerning for metastatic disease. 5. Central filling defect within an enlarged left common femoral vein, consistent  with nonocclusive deep venous thrombosis. 6. Symmetric wall thickening of the distal esophagus with reflux versus retract retained contrast in the distal esophagus. Correlate for esophagitis. 7. Hypodense hepatic lesions measure up to 1 cm in the right lower lobe appear to have been present on prior non contrasted CT and likely reflect cysts. No new discrete hepatic lesion identified.  8. Lobular uterine contour with numerous dystrophic calcifications likely reflecting uterine leiomyomas. 9.  Aortic Atherosclerosis (ICD10-I70.0).   09/06/2021 Imaging   EXAM: CT CHEST WITH CONTRAST  IMPRESSION: 1. Fissural nodularity and fissural thickening throughout the right lung concerning for metastatic disease.   2. Additionally there are bilateral pulmonary nodules in the lower lungs, right greater than left, measuring up to 11 mm suspicious for metastatic disease.   3.  Small right pleural effusion.   4. Enlarged right cardiophrenic lymph nodes suspicious for metastatic disease.   5. Subcentimeter incidental left thyroid nodule with heterogeneous and enlarged thyroid. Recommend thyroid US. Reference: J Am Coll Radiol. 2015 Feb;12(2): 143-50   4. Pancreatic mass, peritoneal metastases and hypodense liver lesions are unchanged from 08/30/2021.   09/12/2021 Initial Biopsy   FINAL MICROSCOPIC DIAGNOSIS:   A.   OMENTAL MASS, NEEDLE CORE BIOPSY:  -    Metastatic adenocarcinoma, see Comment.   COMMENT:  The adenocarcinoma was interrogated with immunohistochemical (IHC) stains (CK7, CK20, PAX8, CDX-2, GATA-3). The malignant cells are CK7 positive, CK20 negative and have weak CDX-2 reactivity. The tumor cells are negative for GATA-3 (breast marker) and have weak/equivocal nuclear reactivity with PAX8.  This immunophenotype is non-specific, but is compatible with a pancreatic ductal primary, which is the favored primary site based on imaging studies. The controls are satisfactory.    09/12/2021  Cancer Staging   Staging form: Exocrine Pancreas, AJCC 8th Edition - Clinical stage from 09/12/2021: Stage IV (cT3, cN0, pM1) - Signed by Truitt Merle, MD on 10/30/2021 Stage prefix: Initial diagnosis Total positive nodes: 0    09/15/2021 Initial Diagnosis   Pancreatic cancer ([place])   09/25/2021 -  Chemotherapy   Patient is on Treatment Plan : PANCREATIC Abraxane / Gemcitabine D1,8, q21d       Genetic Testing   Negative genetic testing. No pathogenic variants identified on the [facility] BRCA1/2 STAT+ CustomNext+RNA panel. The report date is 10/02/2021.  The CustomNext-Cancer+RNAinsight panel offered by [facility] includes sequencing and rearrangement analysis for the following 47 genes:  APC, ATM, AXIN2, BARD1, BMPR1A, BRCA1, BRCA2, BRIP1, CDH1, CDK4, CDKN2A, CHEK2, DICER1, EPCAM, GREM1, HOXB13, MEN1, MLH1, MSH2, MSH3, MSH6, MUTYH, NBN, NF1, NF2, NTHL1, PALB2, PMS2, POLD1, POLE, PTEN, RAD51C, RAD51D, RECQL, RET, SDHA, SDHAF2, SDHB, SDHC, SDHD, SMAD4, SMARCA4, STK11, TP53, TSC1, TSC2, and VHL.  RNA data is routinely analyzed for use in variant interpretation for all genes.       INTERVAL HISTORY:  Kathryn Kent is here for a follow up of metastatic pancreatic cancer. She was last seen by me on 10/30/21. She presents to the clinic accompanied by her daughter. She reports she is doing "okay, I guess." She reports she is having nose bleeds. She tells me about difficulty with home health and transportation. She notes she lives 1-1.25 hrs away. Her daughter lives here in [place].   All other systems were reviewed with the patient and are negative.  MEDICAL HISTORY:  Past Medical History:  Diagnosis Date   Anemia    Coronary artery disease cardiologist---dr berry   cardiac cath --   10/16/05 revealing noncritical CAD   Cyst of left kidney    small   Diverticulosis    Heart murmur    History of colon polyps    History of kidney stones    Hyperlipidemia    Hypertension    Leiomyoma of  uterus    Liver cyst    small   OSA on CPAP    Osteoarthritis    Periumbilical hernia    Moderate   PMB (postmenopausal bleeding)    Vulvar cyst    Wears dentures    upper    SURGICAL HISTORY: Past Surgical History:  Procedure Laterality Date   APPENDECTOMY  age 48   and removal cyst   CARDIAC CATHETERIZATION  10-16-2005   dr berry   nonobstructive cad w/ mid segmental LAD 30-40% ,  normal LVF   COLONOSCOPY  06/24/2017   CYSTOSCOPY W/ URETERAL STENT PLACEMENT Right 01/03/2017   Procedure: CYSTOSCOPY WITH RETROGRADE PYELOGRAM/ URETEROSCOPY/ STONE BASKETRY/URETERAL STENT PLACEMENT;  Surgeon: Cleon Gustin, MD;  Location: [facility];  Service: Urology;  Laterality: Right;   DILATATION & CURETTAGE/HYSTEROSCOPY WITH MYOSURE N/A 12/30/2018   Procedure: [place];  Surgeon: Megan Salon, MD;  Location: [facility];  Service: Gynecology;  Laterality: N/A;   FOOT ARTHRODESIS Bilateral 1987   great toe's   great toe surgery Bilateral    HOLMIUM LASER APPLICATION Right 1/61/0960   Procedure: HOLMIUM LASER APPLICATION;  Surgeon: Cleon Gustin, MD;  Location: [facility];  Service: Urology;  Laterality: Right;   IR IMAGING GUIDED PORT INSERTION  09/22/2021   TOTAL KNEE ARTHROPLASTY Right 01/20/2018   Procedure: RIGHT TOTAL KNEE ARTHROPLASTY;  Surgeon: Gaynelle Arabian, MD;  Location: [facility];  Service: Orthopedics;  Laterality: Right;  with block    I have reviewed the social history and family history with the patient and they are unchanged from previous note.  ALLERGIES:  is allergic to lipitor [atorvastatin].  MEDICATIONS:  Current Outpatient Medications  Medication Sig Dispense Refill   acetaminophen (TYLENOL) 500 MG tablet Take 1,000 mg by mouth every 6 (six) hours as needed for mild pain.     amLODipine (NORVASC) 10 MG tablet Take 10 mg by mouth daily.      chlorthalidone (HYGROTON) 25 MG  tablet Take 0.5 tablets by mouth daily.      dicyclomine (BENTYL) 20 MG tablet Take 1 tablet (20 mg total) by mouth 2 (two) times daily. 20 tablet 0   ezetimibe (ZETIA) 10 MG tablet Take 10 mg by mouth daily.     ibuprofen (ADVIL) 600 MG tablet Take 1 tablet (600 mg total) by mouth every 8 (eight) hours as needed. 30 tablet 0   irbesartan (AVAPRO) 300 MG tablet Take 1 tablet by mouth daily.   1   lidocaine-prilocaine (EMLA) cream Apply to affected area once 30 g 3   montelukast (SINGULAIR) 10 MG tablet Take 1 tablet by mouth at bedtime.     Multiple Vitamin (MULTIVITAMIN) tablet Take 1 tablet by mouth daily.     ondansetron (ZOFRAN) 8 MG tablet Take 1 tablet (8 mg total) by mouth 2 (two) times daily as needed (Nausea or vomiting). 30 tablet 1   potassium chloride (KLOR-CON M) 10 MEQ tablet Take 1 tablet (10 mEq total) by mouth daily.  30 tablet 1   prochlorperazine (COMPAZINE) 10 MG tablet Take 1 tablet (10 mg total) by mouth every 6 (six) hours as needed (Nausea or vomiting). 30 tablet 1   simvastatin (ZOCOR) 40 MG tablet Take 20 mg by mouth 2 (two) times daily.      traMADol (ULTRAM) 50 MG tablet Take 1 tablet (50 mg total) by mouth every 6 (six) hours as needed. 20 tablet 0   No current facility-administered medications for this visit.    PHYSICAL EXAMINATION: ECOG PERFORMANCE STATUS: 1 - Symptomatic but completely ambulatory  Vitals:   11/13/21 0916  BP: 140/82  Pulse: 94  Resp: 18  Temp: 98 F (36.7 C)  SpO2: 99%   Wt Readings from Last 3 Encounters:  11/13/21 179 lb 9.6 oz (81.5 kg)  10/30/21 179 lb 8 oz (81.4 kg)  10/16/21 183 lb 9.6 oz (83.3 kg)     GENERAL:alert, no distress and comfortable SKIN: skin color normal, no rashes or significant lesions EYES: normal, Conjunctiva are pink and non-injected, sclera clear  NEURO: alert & oriented x 3 with fluent speech  LABORATORY DATA:  I have reviewed the data as listed    Latest Ref Rng & Units 11/13/2021    9:00 AM  10/30/2021    8:57 AM 10/18/2021   12:52 PM  CBC  WBC 4.0 - 10.5 K/uL 5.6   6.4   15.0    Hemoglobin 12.0 - 15.0 g/dL 11.4   11.9   10.9    Hematocrit 36.0 - 46.0 % 34.1   36.4   33.5    Platelets 150 - 400 K/uL 176   184   216          Latest Ref Rng & Units 11/13/2021    9:00 AM 10/30/2021    8:57 AM 10/18/2021   12:52 PM  CMP  Glucose 70 - 99 mg/dL 158   154   153    BUN 8 - 23 mg/dL '19   14   15    ' Creatinine 0.44 - 1.00 mg/dL 0.69   0.72   0.86    Sodium 135 - 145 mmol/L 137   135   134    Potassium 3.5 - 5.1 mmol/L 3.5   3.4   3.7    Chloride 98 - 111 mmol/L 101   100   99    CO2 22 - 32 mmol/L '28   28   27    ' Calcium 8.9 - 10.3 mg/dL 9.7   9.7   9.3    Total Protein 6.5 - 8.1 g/dL 7.6   7.7   7.4    Total Bilirubin 0.3 - 1.2 mg/dL 0.6   0.7   1.8    Alkaline Phos 38 - 126 U/L 76   74   60    AST 15 - 41 U/L '19   17   23    ' ALT 0 - 44 U/L '28   25   30        ' RADIOGRAPHIC STUDIES: I have personally reviewed the radiological images as listed and agreed with the findings in the report. No results found.    Orders Placed This Encounter  Procedures   CT ABDOMEN PELVIS W CONTRAST    Standing Status:   Future    Standing Expiration Date:   11/14/2022    Order Specific Question:   If indicated for the ordered procedure, I authorize the administration of contrast media per Radiology  protocol    Answer:   Yes    Order Specific Question:   Preferred imaging location?    Answer:   [facility]    Order Specific Question:   Is Oral Contrast requested for this exam?    Answer:   Yes, Per Radiology protocol   All questions were answered. The patient knows to call the clinic with any problems, questions or concerns. No barriers to learning was detected. The total time spent in the appointment was 30 minutes.     Truitt Merle, MD 11/13/2021   I, Wilburn Mylar, am acting as scribe for Truitt Merle, MD.   I have reviewed the above documentation for accuracy and completeness,  and I agree with the above.

## 2021-11-13 NOTE — Patient Instructions (Signed)
[facility] ONCOLOGY  Discharge Instructions: Thank you for choosing [facility] to provide your oncology and hematology care.   If you have a lab appointment with the [facility], please go directly to the [facility] and check in at the registration area.   Wear comfortable clothing and clothing appropriate for easy access to any Portacath or PICC line.   We strive to give you quality time with your provider. You may need to reschedule your appointment if you arrive late (15 or more minutes).  Arriving late affects you and other patients whose appointments are after yours.  Also, if you miss three or more appointments without notifying the office, you may be dismissed from the clinic at the provider's discretion.      For prescription refill requests, have your pharmacy contact our office and allow 72 hours for refills to be completed.    Today you received the following chemotherapy and/or immunotherapy agents: Abraxane/Gemzar.      To help prevent nausea and vomiting after your treatment, we encourage you to take your nausea medication as directed.  BELOW ARE SYMPTOMS THAT SHOULD BE REPORTED IMMEDIATELY: *FEVER GREATER THAN 100.4 F (38 C) OR HIGHER *CHILLS OR SWEATING *NAUSEA AND VOMITING THAT IS NOT CONTROLLED WITH YOUR NAUSEA MEDICATION *UNUSUAL SHORTNESS OF BREATH *UNUSUAL BRUISING OR BLEEDING *URINARY PROBLEMS (pain or burning when urinating, or frequent urination) *BOWEL PROBLEMS (unusual diarrhea, constipation, pain near the anus) TENDERNESS IN MOUTH AND THROAT WITH OR WITHOUT PRESENCE OF ULCERS (sore throat, sores in mouth, or a toothache) UNUSUAL RASH, SWELLING OR PAIN  UNUSUAL VAGINAL DISCHARGE OR ITCHING   Items with * indicate a potential emergency and should be followed up as soon as possible or go to the Emergency Department if any problems should occur.  Please show the CHEMOTHERAPY ALERT CARD or IMMUNOTHERAPY ALERT CARD at  check-in to the Emergency Department and triage nurse.  Should you have questions after your visit or need to cancel or reschedule your appointment, please contact [facility]  Dept: [phone]  and follow the prompts.  Office hours are 8:00 a.m. to 4:30 p.m. Monday - Friday. Please note that voicemails left after 4:00 p.m. may not be returned until the following business day.  We are closed weekends and major holidays. You have access to a nurse at all times for urgent questions. Please call the main number to the clinic Dept: [phone] and follow the prompts.   For any non-urgent questions, you may also contact your provider using MyChart. We now offer e-Visits for anyone 68 and older to request care online for non-urgent symptoms. For details visit [email or web address].   Also download the MyChart app! Go to the app store, search "MyChart", open the app, select [facility], and log in with your MyChart username and password.  Due to Covid, a mask is required upon entering the hospital/clinic. If you do not have a mask, one will be given to you upon arrival. For doctor visits, patients may have 1 support person aged 5 or older with them. For treatment visits, patients cannot have anyone with them due to current Covid guidelines and our immunocompromised population.

## 2021-11-14 ENCOUNTER — Encounter: Payer: Self-pay | Admitting: Licensed Clinical Social Worker

## 2021-11-14 DIAGNOSIS — C252 Malignant neoplasm of tail of pancreas: Secondary | ICD-10-CM

## 2021-11-14 NOTE — Progress Notes (Signed)
[facility] CSW Progress Note  Clinical Education officer, museum received a call from pt's niece Burman Riis [phone]) regarding concerns related to transportation and home care.  Per niece pt has been staying at her niece's home while undergoing treatment as her niece recently had surgery and was out on medical leave.  Pt's niece will be returning to work in two weeks and pt will return to her own home where she lives alone in [place].  Pt's niece concerned about pt not having support at home and requested a home care referral.  RN sent referral to [facility].  Niece also concerned about transport to and from future appointments.  Referral sent to [facility] for transportation assistance.  CSW also contacted [facility] who informed writer they would be able to assist w/ limited transportation support as well.  CSW to continue to follow as appropriate.     Henriette Combs, LCSW

## 2021-11-21 ENCOUNTER — Other Ambulatory Visit: Payer: Self-pay

## 2021-11-21 DIAGNOSIS — C252 Malignant neoplasm of tail of pancreas: Secondary | ICD-10-CM

## 2021-11-21 NOTE — Progress Notes (Signed)
Pt denied by [facility] d/t pt does not have Medicaid.  Pt's niece called requesting is a new referral can be sent to another [facility] agency.  Sent referral to [facility].  Awaiting [facility]'s acceptance of new pt referral.

## 2021-11-22 ENCOUNTER — Telehealth: Payer: Self-pay | Admitting: Hematology

## 2021-11-22 NOTE — Telephone Encounter (Signed)
Unable to leave message with follow-up appointments per 5/22 los. Mailed calendar.

## 2021-11-26 NOTE — Progress Notes (Unsigned)
[place]   [phone] [phone]   Clinic Follow up Note   Patient Care Team: Crist Infante, MD as PCP - General (Internal Medicine) Lorretta Harp, MD as PCP - Cardiology (Cardiology) Truitt Merle, MD as Consulting Physician (Oncology) Royston Bake, RN as Oncology Nurse Navigator (Oncology) Care, [facility] ([place]) 11/27/2021  CHIEF COMPLAINT: Follow up metastatic pancreatic cancer   SUMMARY OF ONCOLOGIC HISTORY: Oncology History  Pancreatic cancer ([place])  08/30/2021 Imaging   EXAM: CT ABDOMEN AND PELVIS WITH CONTRAST  IMPRESSION: 1. Infiltrative mass in the pancreatic tail which measures 4.7 x 4.4 cm, highly suspicious for pancreatic adenocarcinoma. 2. The mass extends into the splenic hilum with findings suspicious for involvement of the splenic parenchyma as well as abutment with possible invasion of the colonic splenic flexure. 3. Extensive omental and peritoneal nodularity with trace abdominopelvic free fluid, most consistent with carcinomatosis. 4. Right cardiophrenic adenopathy and right sided pulmonary nodules measuring up to 11 mm, concerning for metastatic disease. 5. Central filling defect within an enlarged left common femoral vein, consistent with nonocclusive deep venous thrombosis. 6. Symmetric wall thickening of the distal esophagus with reflux versus retract retained contrast in the distal esophagus. Correlate for esophagitis. 7. Hypodense hepatic lesions measure up to 1 cm in the right lower lobe appear to have been present on prior non contrasted CT and likely reflect cysts. No new discrete hepatic lesion identified.  8. Lobular uterine contour with numerous dystrophic calcifications likely reflecting uterine leiomyomas. 9.  Aortic Atherosclerosis (ICD10-I70.0).   09/06/2021 Imaging   EXAM: CT CHEST WITH CONTRAST  IMPRESSION: 1. Fissural nodularity and fissural thickening throughout the  right lung concerning for metastatic disease.   2. Additionally there are bilateral pulmonary nodules in the lower lungs, right greater than left, measuring up to 11 mm suspicious for metastatic disease.   3.  Small right pleural effusion.   4. Enlarged right cardiophrenic lymph nodes suspicious for metastatic disease.   5. Subcentimeter incidental left thyroid nodule with heterogeneous and enlarged thyroid. Recommend thyroid US. Reference: J Am Coll Radiol. 2015 Feb;12(2): 143-50   4. Pancreatic mass, peritoneal metastases and hypodense liver lesions are unchanged from 08/30/2021.   09/12/2021 Initial Biopsy   FINAL MICROSCOPIC DIAGNOSIS:   A.   OMENTAL MASS, NEEDLE CORE BIOPSY:  -    Metastatic adenocarcinoma, see Comment.   COMMENT:  The adenocarcinoma was interrogated with immunohistochemical (IHC) stains (CK7, CK20, PAX8, CDX-2, GATA-3). The malignant cells are CK7 positive, CK20 negative and have weak CDX-2 reactivity. The tumor cells are negative for GATA-3 (breast marker) and have weak/equivocal nuclear reactivity with PAX8.  This immunophenotype is non-specific, but is compatible with a pancreatic ductal primary, which is the favored primary site based on imaging studies. The controls are satisfactory.    09/12/2021 Cancer Staging   Staging form: Exocrine Pancreas, AJCC 8th Edition - Clinical stage from 09/12/2021: Stage IV (cT3, cN0, pM1) - Signed by Truitt Merle, MD on 10/30/2021 Stage prefix: Initial diagnosis Total positive nodes: 0    09/15/2021 Initial Diagnosis   Pancreatic cancer ([place])   09/25/2021 -  Chemotherapy   Patient is on Treatment Plan : PANCREATIC Abraxane / Gemcitabine D1,8, q21d       Genetic Testing   Negative genetic testing. No pathogenic variants identified on the [facility] BRCA1/2 STAT+ CustomNext+RNA panel. The report date is 10/02/2021.  The CustomNext-Cancer+RNAinsight panel offered by [facility] includes sequencing and rearrangement analysis for  the following  47 genes:  APC, ATM, AXIN2, BARD1, BMPR1A, BRCA1, BRCA2, BRIP1, CDH1, CDK4, CDKN2A, CHEK2, DICER1, EPCAM, GREM1, HOXB13, MEN1, MLH1, MSH2, MSH3, MSH6, MUTYH, NBN, NF1, NF2, NTHL1, PALB2, PMS2, POLD1, POLE, PTEN, RAD51C, RAD51D, RECQL, RET, SDHA, SDHAF2, SDHB, SDHC, SDHD, SMAD4, SMARCA4, STK11, TP53, TSC1, TSC2, and VHL.  RNA data is routinely analyzed for use in variant interpretation for all genes.      CURRENT THERAPY: Gemcitabine, started 09/25/21; abraxane added with C2 on 10/16/21  INTERVAL HISTORY: Kathryn Kent returns for follow up as scheduled. Last seen by Dr. Burr Medico 11/13/21 and completed C3D1 gem/abraxane.  After chemo she is sick for 4-5 days with weakness, fast heartbeat, and nausea with some gagging.  She remains able to be out of bed and completing ADLs.  Denies fall.  She has been getting Meals on Wheels this past week.  She has had periods of fast heartbeat all her life.  Zofran is effective for nausea.  She takes MiraLAX every other day, usually works by the next morning and she may have up to 4 stools that day.  She notes her ribs are sore for couple days after treatment.  Denies fever, chills, cough, chest pain, dyspnea, leg edema, neuropathy, or any other new specific complaints.   MEDICAL HISTORY:  Past Medical History:  Diagnosis Date   Anemia    Coronary artery disease cardiologist---dr berry   cardiac cath --   10/16/05 revealing noncritical CAD   Cyst of left kidney    small   Diverticulosis    Heart murmur    History of colon polyps    History of kidney stones    Hyperlipidemia    Hypertension    Leiomyoma of uterus    Liver cyst    small   OSA on CPAP    Osteoarthritis    Periumbilical hernia    Moderate   PMB (postmenopausal bleeding)    Vulvar cyst    Wears dentures    upper    SURGICAL HISTORY: Past Surgical History:  Procedure Laterality Date   APPENDECTOMY  age 37   and removal cyst   CARDIAC CATHETERIZATION  10-16-2005   dr berry    nonobstructive cad w/ mid segmental LAD 30-40% ,  normal LVF   COLONOSCOPY  06/24/2017   CYSTOSCOPY W/ URETERAL STENT PLACEMENT Right 01/03/2017   Procedure: CYSTOSCOPY WITH RETROGRADE PYELOGRAM/ URETEROSCOPY/ STONE BASKETRY/URETERAL STENT PLACEMENT;  Surgeon: Cleon Gustin, MD;  Location: [place];  Service: Urology;  Laterality: Right;   DILATATION & CURETTAGE/HYSTEROSCOPY WITH MYOSURE N/A 12/30/2018   Procedure: [place];  Surgeon: Megan Salon, MD;  Location: [facility];  Service: Gynecology;  Laterality: N/A;   FOOT ARTHRODESIS Bilateral 1987   great toe's   great toe surgery Bilateral    HOLMIUM LASER APPLICATION Right 2/48/2500   Procedure: HOLMIUM LASER APPLICATION;  Surgeon: Cleon Gustin, MD;  Location: [facility];  Service: Urology;  Laterality: Right;   IR IMAGING GUIDED PORT INSERTION  09/22/2021   TOTAL KNEE ARTHROPLASTY Right 01/20/2018   Procedure: RIGHT TOTAL KNEE ARTHROPLASTY;  Surgeon: Gaynelle Arabian, MD;  Location: [facility];  Service: Orthopedics;  Laterality: Right;  with block    I have reviewed the social history and family history with the patient and they are unchanged from previous note.  ALLERGIES:  is allergic to lipitor [atorvastatin].  MEDICATIONS:  Current Outpatient Medications  Medication Sig Dispense Refill   acetaminophen (TYLENOL)  500 MG tablet Take 1,000 mg by mouth every 6 (six) hours as needed for mild pain.     amLODipine (NORVASC) 10 MG tablet Take 10 mg by mouth daily.      chlorthalidone (HYGROTON) 25 MG tablet Take 0.5 tablets by mouth daily.      dicyclomine (BENTYL) 20 MG tablet Take 1 tablet (20 mg total) by mouth 2 (two) times daily. 20 tablet 0   ezetimibe (ZETIA) 10 MG tablet Take 10 mg by mouth daily.     ibuprofen (ADVIL) 600 MG tablet Take 1 tablet (600 mg total) by mouth every 8 (eight) hours as needed. 30 tablet 0   irbesartan (AVAPRO)  300 MG tablet Take 1 tablet by mouth daily.   1   lidocaine-prilocaine (EMLA) cream Apply to affected area once 30 g 3   montelukast (SINGULAIR) 10 MG tablet Take 1 tablet by mouth at bedtime.     Multiple Vitamin (MULTIVITAMIN) tablet Take 1 tablet by mouth daily.     ondansetron (ZOFRAN) 8 MG tablet Take 1 tablet (8 mg total) by mouth 2 (two) times daily as needed (Nausea or vomiting). 30 tablet 1   potassium chloride (KLOR-CON M) 10 MEQ tablet Take 1 tablet (10 mEq total) by mouth daily. 30 tablet 1   prochlorperazine (COMPAZINE) 10 MG tablet Take 1 tablet (10 mg total) by mouth every 6 (six) hours as needed (Nausea or vomiting). 30 tablet 1   simvastatin (ZOCOR) 40 MG tablet Take 20 mg by mouth 2 (two) times daily.      traMADol (ULTRAM) 50 MG tablet Take 1 tablet (50 mg total) by mouth every 6 (six) hours as needed. 20 tablet 0   No current facility-administered medications for this visit.    PHYSICAL EXAMINATION: ECOG PERFORMANCE STATUS: 1 - Symptomatic but completely ambulatory  Vitals:   11/27/21 1209  BP: (!) 141/78  Pulse: 82  Resp: 17  Temp: 97.9 F (36.6 C)  SpO2: 99%   Filed Weights   11/27/21 1209  Weight: 179 lb (81.2 kg)    GENERAL:alert, no distress and comfortable SKIN: No rash.  Palms with mild hyperpigmentation EYES: sclera clear LUNGS: clear with normal breathing effort HEART: regular rate & rhythm, no lower extremity edema ABDOMEN:abdomen soft, non-tender and normal bowel sounds NEURO: alert & oriented x 3 with fluent speech, no focal motor/sensory deficits PAC without erythema   LABORATORY DATA:  I have reviewed the data as listed    Latest Ref Rng & Units 11/27/2021   11:36 AM 11/13/2021    9:00 AM 10/30/2021    8:57 AM  CBC  WBC 4.0 - 10.5 K/uL 5.2   5.6   6.4    Hemoglobin 12.0 - 15.0 g/dL 11.6   11.4   11.9    Hematocrit 36.0 - 46.0 % 35.3   34.1   36.4    Platelets 150 - 400 K/uL 173   176   184          Latest Ref Rng & Units 11/27/2021    11:36 AM 11/13/2021    9:00 AM 10/30/2021    8:57 AM  CMP  Glucose 70 - 99 mg/dL 149   158   154    BUN 8 - 23 mg/dL '24   19   14    ' Creatinine 0.44 - 1.00 mg/dL 0.66   0.69   0.72    Sodium 135 - 145 mmol/L 138   137   135  Potassium 3.5 - 5.1 mmol/L 3.8   3.5   3.4    Chloride 98 - 111 mmol/L 101   101   100    CO2 22 - 32 mmol/L '29   28   28    ' Calcium 8.9 - 10.3 mg/dL 10.1   9.7   9.7    Total Protein 6.5 - 8.1 g/dL 7.5   7.6   7.7    Total Bilirubin 0.3 - 1.2 mg/dL 0.7   0.6   0.7    Alkaline Phos 38 - 126 U/L 85   76   74    AST 15 - 41 U/L '24   19   17    ' ALT 0 - 44 U/L '28   28   25        ' RADIOGRAPHIC STUDIES: I have personally reviewed the radiological images as listed and agreed with the findings in the report. No results found.   ASSESSMENT & PLAN: Kathryn Kent is a 86 y.o. female with    1. Metastatic Pancreatic Cancer to peritoneum and lungs, stage IV  -presented to PCP with rectal, vaginal, and pelvic area pain, as well as positive Hemoccult stool test. CT AP on 08/30/21 showed 4.7 cm pancreatic tail mass extending into splenic hilum and suspicious for involvement of splenic parenchyma and abutment with possible invasion of colonic splenic flexure; extensive omental and peritoneal nodularity; right cardiophrenic adenopathy and right-sided pulmonary nodules. -baseline tumor markers on 09/04/21 were all elevated-- CEA of 24.71, CA 125 of 67.7, and CA 19.9 of 1,208. -staging chest CT on 09/06/21 showed: nodularity and thickening throughout right lung; bilateral pulmonary nodules in lower lungs, R>L, up to 11 mm; small right pleural effusion; enlarged right cardiophrenic lymph nodes. -biopsy of omental mass on 09/12/21 confirmed metastatic adenocarcinoma, compatible with pancreatic primary based on imaging. -she began first cycle chemo with single agent gemcitabine on 09/25/21. She tolerated well overall. Abraxane was added with cycle 2. She is now receiving treatment every 2  weeks. She is tolerating very well and her abdominal pain has resolved  -Kathryn Kent appears stable.  S/p cycle 3-day 1 gemcitabine/Abraxane (q2 weeks).  Tolerating treatment moderately well with weakness, tachycardia, nausea, and constipation for 4-5 days.  Side effects are well managed with supportive care at home.  We reviewed symptom management.  She is able to recover and function well.  There is no clinical evidence of disease progression. -She has Meals on Wheels assistance, home health referral is pending.  I will also follow-up on the insurance questions presented by her niece today -Labs reviewed.  We will follow-up on the pending CA 19-9 from today, it had increased last time. -Proceed with cycle 3-day 15 gemcitabine and Abraxane today as planned, same doses -We will try to arrange restaging CT on 6/20 same day as other appointments given that she lives an hour away   2. Weight Loss, Constipation, Pain -secondary to #1 -chart review shows weight loss of about 20 lbs from 10/2020 - 08/2021. -Continue follow-up with dietitian -she is on tramadol for abdominal, armpit, and groin pains. -she was prescribed bentyl in the ED on 10/18/21 for constipation. -We reviewed symptom management for constipation, she knows to titrate MiraLAX as needed, and take half packet or space out to every 2 days for increase stool output   3. Thyroid Nodule -incidental subcentimeter left thyroid nodule with heterogeneous and enlarged thyroid on chest CT 09/06/21.   4. Goal of care discussion -  Treatment goal of chemo is palliative  -Full code    Plan: -Labs reviewed, we will follow-up on the pending CA 19-9 from today -Proceed with cycle 3-day 15 gem/Abraxane at same doses -Follow-up by [facility] health referral and insurance questions -Restage 6/20 then lab, follow-up, and treatment same day (given contrast today)   All questions were answered. The patient knows to call the clinic with any problems,  questions or concerns. No barriers to learning were detected.      Alla Feeling, NP 11/27/21

## 2021-11-27 ENCOUNTER — Inpatient Hospital Stay (HOSPITAL_BASED_OUTPATIENT_CLINIC_OR_DEPARTMENT_OTHER): Payer: Medicare Other | Admitting: Nurse Practitioner

## 2021-11-27 ENCOUNTER — Encounter: Payer: Self-pay | Admitting: Nurse Practitioner

## 2021-11-27 ENCOUNTER — Inpatient Hospital Stay: Payer: Medicare Other

## 2021-11-27 ENCOUNTER — Inpatient Hospital Stay: Payer: Medicare Other | Attending: Physician Assistant

## 2021-11-27 ENCOUNTER — Inpatient Hospital Stay: Payer: Medicare Other | Admitting: Nutrition

## 2021-11-27 ENCOUNTER — Other Ambulatory Visit: Payer: Self-pay

## 2021-11-27 VITALS — BP 141/78 | HR 82 | Temp 97.9°F | Resp 17 | Ht 64.0 in | Wt 179.0 lb

## 2021-11-27 DIAGNOSIS — C7802 Secondary malignant neoplasm of left lung: Secondary | ICD-10-CM | POA: Insufficient documentation

## 2021-11-27 DIAGNOSIS — Z5111 Encounter for antineoplastic chemotherapy: Secondary | ICD-10-CM | POA: Insufficient documentation

## 2021-11-27 DIAGNOSIS — C7801 Secondary malignant neoplasm of right lung: Secondary | ICD-10-CM | POA: Diagnosis not present

## 2021-11-27 DIAGNOSIS — C252 Malignant neoplasm of tail of pancreas: Secondary | ICD-10-CM

## 2021-11-27 DIAGNOSIS — Z95828 Presence of other vascular implants and grafts: Secondary | ICD-10-CM

## 2021-11-27 DIAGNOSIS — C786 Secondary malignant neoplasm of retroperitoneum and peritoneum: Secondary | ICD-10-CM | POA: Diagnosis not present

## 2021-11-27 DIAGNOSIS — Z5112 Encounter for antineoplastic immunotherapy: Secondary | ICD-10-CM | POA: Diagnosis not present

## 2021-11-27 LAB — COMPREHENSIVE METABOLIC PANEL
ALT: 28 U/L (ref 0–44)
AST: 24 U/L (ref 15–41)
Albumin: 3.8 g/dL (ref 3.5–5.0)
Alkaline Phosphatase: 85 U/L (ref 38–126)
Anion gap: 8 (ref 5–15)
BUN: 24 mg/dL — ABNORMAL HIGH (ref 8–23)
CO2: 29 mmol/L (ref 22–32)
Calcium: 10.1 mg/dL (ref 8.9–10.3)
Chloride: 101 mmol/L (ref 98–111)
Creatinine, Ser: 0.66 mg/dL (ref 0.44–1.00)
GFR, Estimated: >60 mL/min (ref >60)
Glucose, Bld: 149 mg/dL — ABNORMAL HIGH (ref 70–99)
Potassium: 3.8 mmol/L (ref 3.5–5.1)
Sodium: 138 mmol/L (ref 135–145)
Total Bilirubin: 0.7 mg/dL (ref 0.3–1.2)
Total Protein: 7.5 g/dL (ref 6.5–8.1)

## 2021-11-27 LAB — CBC WITH DIFFERENTIAL/PLATELET
Abs Immature Granulocytes: 0.02 10*3/uL (ref 0.00–0.07)
Basophils Absolute: 0 10*3/uL (ref 0.0–0.1)
Basophils Relative: 1 %
Eosinophils Absolute: 0.1 10*3/uL (ref 0.0–0.5)
Eosinophils Relative: 2 %
HCT: 35.3 % — ABNORMAL LOW (ref 36.0–46.0)
Hemoglobin: 11.6 g/dL — ABNORMAL LOW (ref 12.0–15.0)
Immature Granulocytes: 0 %
Lymphocytes Relative: 23 %
Lymphs Abs: 1.2 10*3/uL (ref 0.7–4.0)
MCH: 29.7 pg (ref 26.0–34.0)
MCHC: 32.9 g/dL (ref 30.0–36.0)
MCV: 90.3 fL (ref 80.0–100.0)
Monocytes Absolute: 0.7 10*3/uL (ref 0.1–1.0)
Monocytes Relative: 13 %
Neutro Abs: 3.1 10*3/uL (ref 1.7–7.7)
Neutrophils Relative %: 61 %
Platelets: 173 10*3/uL (ref 150–400)
RBC: 3.91 MIL/uL (ref 3.87–5.11)
RDW: 16.7 % — ABNORMAL HIGH (ref 11.5–15.5)
WBC: 5.2 10*3/uL (ref 4.0–10.5)
nRBC: 0 % (ref 0.0–0.2)

## 2021-11-27 MED ORDER — SODIUM CHLORIDE 0.9 % IV SOLN
800.0000 mg/m2 | Freq: Once | INTRAVENOUS | Status: AC
  Administered 2021-11-27: 1596 mg via INTRAVENOUS
  Filled 2021-11-27: qty 41.98

## 2021-11-27 MED ORDER — SODIUM CHLORIDE 0.9 % IV SOLN: Freq: Once | INTRAVENOUS | Status: AC

## 2021-11-27 MED ORDER — SODIUM CHLORIDE 0.9% FLUSH
10.0000 mL | Freq: Once | INTRAVENOUS | Status: AC
  Administered 2021-11-27: 10 mL

## 2021-11-27 MED ORDER — HEPARIN SOD (PORK) LOCK FLUSH 100 UNIT/ML IV SOLN
500.0000 [IU] | Freq: Once | INTRAVENOUS | Status: AC | PRN
  Administered 2021-11-27: 500 [IU]

## 2021-11-27 MED ORDER — PROCHLORPERAZINE MALEATE 10 MG PO TABS
10.0000 mg | ORAL_TABLET | Freq: Once | ORAL | Status: AC
  Administered 2021-11-27: 10 mg via ORAL
  Filled 2021-11-27: qty 1

## 2021-11-27 MED ORDER — PACLITAXEL PROTEIN-BOUND CHEMO INJECTION 100 MG
100.0000 mg/m2 | Freq: Once | INTRAVENOUS | Status: AC
  Administered 2021-11-27: 200 mg via INTRAVENOUS
  Filled 2021-11-27: qty 40

## 2021-11-27 MED ORDER — SODIUM CHLORIDE 0.9% FLUSH
10.0000 mL | INTRAVENOUS | Status: DC | PRN
  Administered 2021-11-27: 10 mL

## 2021-11-27 NOTE — Patient Instructions (Signed)
[facility] ONCOLOGY  Discharge Instructions: Thank you for choosing [facility] to provide your oncology and hematology care.   If you have a lab appointment with the [facility], please go directly to the [facility] and check in at the registration area.   Wear comfortable clothing and clothing appropriate for easy access to any Portacath or PICC line.   We strive to give you quality time with your provider. You may need to reschedule your appointment if you arrive late (15 or more minutes).  Arriving late affects you and other patients whose appointments are after yours.  Also, if you miss three or more appointments without notifying the office, you may be dismissed from the clinic at the provider's discretion.      For prescription refill requests, have your pharmacy contact our office and allow 72 hours for refills to be completed.    Today you received the following chemotherapy and/or immunotherapy agents abraxane, gemcitabine      To help prevent nausea and vomiting after your treatment, we encourage you to take your nausea medication as directed.  BELOW ARE SYMPTOMS THAT SHOULD BE REPORTED IMMEDIATELY: *FEVER GREATER THAN 100.4 F (38 C) OR HIGHER *CHILLS OR SWEATING *NAUSEA AND VOMITING THAT IS NOT CONTROLLED WITH YOUR NAUSEA MEDICATION *UNUSUAL SHORTNESS OF BREATH *UNUSUAL BRUISING OR BLEEDING *URINARY PROBLEMS (pain or burning when urinating, or frequent urination) *BOWEL PROBLEMS (unusual diarrhea, constipation, pain near the anus) TENDERNESS IN MOUTH AND THROAT WITH OR WITHOUT PRESENCE OF ULCERS (sore throat, sores in mouth, or a toothache) UNUSUAL RASH, SWELLING OR PAIN  UNUSUAL VAGINAL DISCHARGE OR ITCHING   Items with * indicate a potential emergency and should be followed up as soon as possible or go to the Emergency Department if any problems should occur.  Please show the CHEMOTHERAPY ALERT CARD or IMMUNOTHERAPY ALERT CARD at  check-in to the Emergency Department and triage nurse.  Should you have questions after your visit or need to cancel or reschedule your appointment, please contact [facility]  Dept: [phone]  and follow the prompts.  Office hours are 8:00 a.m. to 4:30 p.m. Monday - Friday. Please note that voicemails left after 4:00 p.m. may not be returned until the following business day.  We are closed weekends and major holidays. You have access to a nurse at all times for urgent questions. Please call the main number to the clinic Dept: [phone] and follow the prompts.   For any non-urgent questions, you may also contact your provider using MyChart. We now offer e-Visits for anyone 3 and older to request care online for non-urgent symptoms. For details visit [email or web address].   Also download the MyChart app! Go to the app store, search "MyChart", open the app, select [facility], and log in with your MyChart username and password.  Due to Covid, a mask is required upon entering the hospital/clinic. If you do not have a mask, one will be given to you upon arrival. For doctor visits, patients may have 1 support person aged 21 or older with them. For treatment visits, patients cannot have anyone with them due to current Covid guidelines and our immunocompromised population.

## 2021-11-27 NOTE — Progress Notes (Signed)
Nutrition follow up completed with patient during infusion for Pancreas cancer. Weight stable at 179 pounds ~ 1 month. Noted labs: Glucose 149 and BUN 24.  Patient reports bowels improved on Miralax. MD changed dosage to once every other day. Nausea improved. She drinks 3 cartons of Ensure Complete daily. She wonders if it has too much sugar.  Nutrition Diagnosis: Unintended wt loss stable.  Intervention: Educated to continue Ensure Complete 3 times daily. Educated on importance of the calories and protein provided in these. Laxatives/stool softeners per MD. Increase water intake to ~ 4 - 16 oz bottles. Continue wt maintenance.  Monitoring, Evaluation, Goals: Patient will maintain wt with adequate calories and protein.  Next Visit:Will schedule as needed.

## 2021-11-28 ENCOUNTER — Telehealth: Payer: Self-pay | Admitting: Hematology

## 2021-11-28 ENCOUNTER — Other Ambulatory Visit: Payer: Medicare Other

## 2021-11-28 ENCOUNTER — Ambulatory Visit: Payer: Medicare Other | Admitting: Nurse Practitioner

## 2021-11-28 ENCOUNTER — Ambulatory Visit: Payer: Medicare Other

## 2021-11-28 ENCOUNTER — Encounter: Payer: Medicare Other | Admitting: Nutrition

## 2021-11-28 LAB — CANCER ANTIGEN 19-9: CA 19-9: 2152 U/mL — ABNORMAL HIGH (ref 0–35)

## 2021-11-28 NOTE — Telephone Encounter (Signed)
Scheduled follow-up appointment per 6/5 los. Patient's niece is aware.

## 2021-12-09 ENCOUNTER — Ambulatory Visit (HOSPITAL_COMMUNITY)
Admission: RE | Admit: 2021-12-09 | Discharge: 2021-12-09 | Disposition: A | Discharge status: Home / Self Care | Payer: Medicare Other | Source: Ambulatory Visit | Attending: Hematology | Admitting: Hematology

## 2021-12-09 DIAGNOSIS — C252 Malignant neoplasm of tail of pancreas: Secondary | ICD-10-CM | POA: Diagnosis present

## 2021-12-09 MED ORDER — IOHEXOL 300 MG/ML  SOLN
100.0000 mL | Freq: Once | INTRAMUSCULAR | Status: AC | PRN
Start: 2021-12-09 — End: 2021-12-09
  Administered 2021-12-09: 100 mL via INTRAVENOUS

## 2021-12-12 ENCOUNTER — Inpatient Hospital Stay: Payer: Medicare Other | Admitting: Hematology

## 2021-12-12 ENCOUNTER — Other Ambulatory Visit: Payer: Self-pay

## 2021-12-12 ENCOUNTER — Inpatient Hospital Stay: Payer: Medicare Other

## 2021-12-12 ENCOUNTER — Encounter: Payer: Self-pay | Admitting: Hematology

## 2021-12-12 VITALS — BP 135/85 | HR 88 | Temp 98.6°F | Resp 18 | Ht 64.0 in | Wt 176.7 lb

## 2021-12-12 DIAGNOSIS — C252 Malignant neoplasm of tail of pancreas: Secondary | ICD-10-CM

## 2021-12-12 DIAGNOSIS — Z95828 Presence of other vascular implants and grafts: Secondary | ICD-10-CM

## 2021-12-12 DIAGNOSIS — E876 Hypokalemia: Secondary | ICD-10-CM

## 2021-12-12 DIAGNOSIS — Z5111 Encounter for antineoplastic chemotherapy: Secondary | ICD-10-CM | POA: Diagnosis not present

## 2021-12-12 LAB — CBC WITH DIFFERENTIAL/PLATELET
Abs Immature Granulocytes: 0.02 10*3/uL (ref 0.00–0.07)
Basophils Absolute: 0 10*3/uL (ref 0.0–0.1)
Basophils Relative: 1 %
Eosinophils Absolute: 0.1 10*3/uL (ref 0.0–0.5)
Eosinophils Relative: 2 %
HCT: 35.5 % — ABNORMAL LOW (ref 36.0–46.0)
Hemoglobin: 11.9 g/dL — ABNORMAL LOW (ref 12.0–15.0)
Immature Granulocytes: 0 %
Lymphocytes Relative: 23 %
Lymphs Abs: 1.2 10*3/uL (ref 0.7–4.0)
MCH: 30.5 pg (ref 26.0–34.0)
MCHC: 33.5 g/dL (ref 30.0–36.0)
MCV: 91 fL (ref 80.0–100.0)
Monocytes Absolute: 0.6 10*3/uL (ref 0.1–1.0)
Monocytes Relative: 10 %
Neutro Abs: 3.4 10*3/uL (ref 1.7–7.7)
Neutrophils Relative %: 64 %
Platelets: 168 10*3/uL (ref 150–400)
RBC: 3.9 MIL/uL (ref 3.87–5.11)
RDW: 16.2 % — ABNORMAL HIGH (ref 11.5–15.5)
WBC: 5.3 10*3/uL (ref 4.0–10.5)
nRBC: 0 % (ref 0.0–0.2)

## 2021-12-12 LAB — COMPREHENSIVE METABOLIC PANEL
ALT: 25 U/L (ref 0–44)
AST: 21 U/L (ref 15–41)
Albumin: 3.8 g/dL (ref 3.5–5.0)
Alkaline Phosphatase: 68 U/L (ref 38–126)
Anion gap: 5 (ref 5–15)
BUN: 19 mg/dL (ref 8–23)
CO2: 30 mmol/L (ref 22–32)
Calcium: 10.1 mg/dL (ref 8.9–10.3)
Chloride: 100 mmol/L (ref 98–111)
Creatinine, Ser: 0.76 mg/dL (ref 0.44–1.00)
GFR, Estimated: >60 mL/min (ref >60)
Glucose, Bld: 135 mg/dL — ABNORMAL HIGH (ref 70–99)
Potassium: 3.7 mmol/L (ref 3.5–5.1)
Sodium: 135 mmol/L (ref 135–145)
Total Bilirubin: 0.9 mg/dL (ref 0.3–1.2)
Total Protein: 8 g/dL (ref 6.5–8.1)

## 2021-12-12 MED ORDER — ONDANSETRON HCL 4 MG/2ML IJ SOLN
8.0000 mg | Freq: Once | INTRAMUSCULAR | Status: AC
  Administered 2021-12-12: 8 mg via INTRAVENOUS
  Filled 2021-12-12: qty 4

## 2021-12-12 MED ORDER — PROCHLORPERAZINE MALEATE 10 MG PO TABS: 10.0000 mg | ORAL_TABLET | Freq: Four times a day (QID) | ORAL | 1 refills | Status: DC | PRN

## 2021-12-12 MED ORDER — SODIUM CHLORIDE 0.9% FLUSH
10.0000 mL | Freq: Once | INTRAVENOUS | Status: AC
  Administered 2021-12-12: 10 mL

## 2021-12-12 MED ORDER — PROCHLORPERAZINE MALEATE 10 MG PO TABS
10.0000 mg | ORAL_TABLET | Freq: Once | ORAL | Status: AC
  Administered 2021-12-12: 10 mg via ORAL
  Filled 2021-12-12: qty 1

## 2021-12-12 MED ORDER — HEPARIN SOD (PORK) LOCK FLUSH 100 UNIT/ML IV SOLN
500.0000 [IU] | Freq: Once | INTRAVENOUS | Status: AC | PRN
  Administered 2021-12-12: 500 [IU]

## 2021-12-12 MED ORDER — SODIUM CHLORIDE 0.9 % IV SOLN
800.0000 mg/m2 | Freq: Once | INTRAVENOUS | Status: AC
  Administered 2021-12-12: 1596 mg via INTRAVENOUS
  Filled 2021-12-12: qty 41.98

## 2021-12-12 MED ORDER — DEXAMETHASONE SODIUM PHOSPHATE 10 MG/ML IJ SOLN
5.0000 mg | Freq: Once | INTRAMUSCULAR | Status: AC
  Administered 2021-12-12: 5 mg via INTRAVENOUS
  Filled 2021-12-12: qty 1

## 2021-12-12 MED ORDER — POTASSIUM CHLORIDE CRYS ER 10 MEQ PO TBCR: 10.0000 meq | EXTENDED_RELEASE_TABLET | Freq: Every day | ORAL | 1 refills | Status: DC

## 2021-12-12 MED ORDER — SODIUM CHLORIDE 0.9% FLUSH
10.0000 mL | INTRAVENOUS | Status: DC | PRN
  Administered 2021-12-12: 10 mL

## 2021-12-12 MED ORDER — SODIUM CHLORIDE 0.9 % IV SOLN: Freq: Once | INTRAVENOUS | Status: DC

## 2021-12-12 MED ORDER — SODIUM CHLORIDE 0.9 % IV SOLN: Freq: Once | INTRAVENOUS | Status: AC

## 2021-12-12 MED ORDER — ONDANSETRON HCL 8 MG PO TABS: 8.0000 mg | ORAL_TABLET | Freq: Two times a day (BID) | ORAL | 1 refills | Status: DC | PRN

## 2021-12-12 MED ORDER — PACLITAXEL PROTEIN-BOUND CHEMO INJECTION 100 MG
80.0000 mg/m2 | Freq: Once | INTRAVENOUS | Status: AC
  Administered 2021-12-12: 150 mg via INTRAVENOUS
  Filled 2021-12-12: qty 30

## 2021-12-12 NOTE — Progress Notes (Addendum)
[place]   Telephone:([phone] [phone]   Clinic Follow up Note   Patient Care Team: Crist Infante, MD as PCP - General (Internal Medicine) Lorretta Harp, MD as PCP - Cardiology (Cardiology) Truitt Merle, MD as Consulting Physician (Oncology) Royston Bake, RN as Oncology Nurse Navigator (Oncology) Care, [facility] ([place])  Date of Service:  12/12/2021  CHIEF COMPLAINT: f/u of metastatic pancreatic cancer  CURRENT THERAPY:  Gemcitabine, started 09/25/21             -Abraxane added with C2 (10/16/21)  ASSESSMENT & PLAN:  Kathryn Kent is a 86 y.o. female with   1. Metastatic Pancreatic Cancer to peritoneum and lungs, stage IV  -presented to PCP with rectal, vaginal, and pelvic area pain, as well as positive Hemoccult stool test. CT AP on 08/30/21 showed 4.7 cm pancreatic tail mass extending into splenic hilum and suspicious for involvement of splenic parenchyma and abutment with possible invasion of colonic splenic flexure; extensive omental and peritoneal nodularity; right cardiophrenic adenopathy and right-sided pulmonary nodules. -baseline tumor markers on 09/04/21 were all elevated-- CEA of 24.71, CA 125 of 67.7, and CA 19.9 of 1,208. -staging chest CT on 09/06/21 showed: nodularity and thickening throughout right lung; bilateral pulmonary nodules in lower lungs, R>L, up to 11 mm; small right pleural effusion; enlarged right cardiophrenic lymph nodes. -biopsy of omental mass on 09/12/21 confirmed metastatic adenocarcinoma, compatible with pancreatic primary based on imaging. -she began first cycle chemo with single agent gemcitabine on 09/25/21. She tolerated well overall. Abraxane was added with cycle 2, she did well except constipation. She is now receiving treatment every 2 weeks. She is tolerating very well and her abdominal pain has resolved  -CA 19-9 has fluctuated but is stable over the last month. -restaging CT AP 12/09/21  showed little change in pancreatic mass, no definite disease progression. I reviewed the results with them today.  Her abdominal pain has resolved after starting chemo, which indicating good clinical response.  Her tumor marker however has been trending up, will monitor closely. -I recommend continuing her current treatment. I discussed reducing her dose given her side effects for 4 days after infusion. We discussed the balance between disease control and quality of life. -labs reviewed, overall stable, hgb stable at 11.9, adequate for treatment, will proceed    2. Weight Loss, Constipation, Nausea -secondary to #1, treatment -chart review shows weight loss of about 20 lbs from 10/2020 - 08/2021. She met with dietician on 09/25/21.  -she was prescribed bentyl in the ED on 10/18/21 for constipation. -she reports her pain has resolved on treatment. -I called in zofran and compazine for her post-infusion nausea.   3. Thyroid Nodule -incidental subcentimeter left thyroid nodule with heterogeneous and enlarged thyroid on chest CT 09/06/21.   4. Goal of care discussion -we previously discussed the incurable nature of her cancer, and the overall poor prognosis, especially if she does not have good response to chemotherapy or progress on chemo -The patient understands the goal of care is palliative. -she is full code now      PLAN: -proceed with C4D1 gemcitabine and abraxane today with reduced abraxane dose to 20m/m2 and add premedication dexamethasone and potential for nausea and fatigue after chemo. -lab, f/u, and gem/abraxane in 2 and 4 weeks   No problem-specific Assessment & Plan notes found for this encounter.   SUMMARY OF ONCOLOGIC HISTORY: Oncology History  [place]ic [place]  08/30/2021 Imaging  EXAM: CT ABDOMEN AND PELVIS WITH CONTRAST  IMPRESSION: 1. Infiltrative mass in the pancreatic tail which measures 4.7 x 4.4 cm, highly suspicious for pancreatic adenocarcinoma. 2. The  mass extends into the splenic hilum with findings suspicious for involvement of the splenic parenchyma as well as abutment with possible invasion of the colonic splenic flexure. 3. Extensive omental and peritoneal nodularity with trace abdominopelvic free fluid, most consistent with carcinomatosis. 4. Right cardiophrenic adenopathy and right sided pulmonary nodules measuring up to 11 mm, concerning for metastatic disease. 5. Central filling defect within an enlarged left common femoral vein, consistent with nonocclusive deep venous thrombosis. 6. Symmetric wall thickening of the distal esophagus with reflux versus retract retained contrast in the distal esophagus. Correlate for esophagitis. 7. Hypodense hepatic lesions measure up to 1 cm in the right lower lobe appear to have been present on prior non contrasted CT and likely reflect cysts. No new discrete hepatic lesion identified.  8. Lobular uterine contour with numerous dystrophic calcifications likely reflecting uterine leiomyomas. 9.  Aortic Atherosclerosis (ICD10-I70.0).   09/06/2021 Imaging   EXAM: CT CHEST WITH CONTRAST  IMPRESSION: 1. Fissural nodularity and fissural thickening throughout the right lung concerning for metastatic disease.   2. Additionally there are bilateral pulmonary nodules in the lower lungs, right greater than left, measuring up to 11 mm suspicious for metastatic disease.   3.  Small right pleural effusion.   4. Enlarged right cardiophrenic lymph nodes suspicious for metastatic disease.   5. Subcentimeter incidental left thyroid nodule with heterogeneous and enlarged thyroid. Recommend thyroid US. Reference: J Am Coll Radiol. 2015 Feb;12(2): 143-50   4. Pancreatic mass, peritoneal metastases and hypodense liver lesions are unchanged from 08/30/2021.   09/12/2021 Initial Biopsy   FINAL MICROSCOPIC DIAGNOSIS:   A.   OMENTAL MASS, NEEDLE CORE BIOPSY:  -    Metastatic adenocarcinoma, see Comment.    COMMENT:  The adenocarcinoma was interrogated with immunohistochemical (IHC) stains (CK7, CK20, PAX8, CDX-2, GATA-3). The malignant cells are CK7 positive, CK20 negative and have weak CDX-2 reactivity. The tumor cells are negative for GATA-3 (breast marker) and have weak/equivocal nuclear reactivity with PAX8.  This immunophenotype is non-specific, but is compatible with a pancreatic ductal primary, which is the favored primary site based on imaging studies. The controls are satisfactory.    09/12/2021 Cancer Staging   Staging form: Exocrine Pancreas, AJCC 8th Edition - Clinical stage from 09/12/2021: Stage IV (cT3, cN0, pM1) - Signed by Truitt Merle, MD on 10/30/2021 Stage prefix: Initial diagnosis Total positive nodes: 0   09/15/2021 Initial Diagnosis   Pancreatic cancer ([place])   09/25/2021 -  Chemotherapy   Patient is on Treatment Plan : PANCREATIC Abraxane / Gemcitabine D1,8, q21d      Genetic Testing   Negative genetic testing. No pathogenic variants identified on the [facility] BRCA1/2 STAT+ CustomNext+RNA panel. The report date is 10/02/2021.  The CustomNext-Cancer+RNAinsight panel offered by [facility] includes sequencing and rearrangement analysis for the following 47 genes:  APC, ATM, AXIN2, BARD1, BMPR1A, BRCA1, BRCA2, BRIP1, CDH1, CDK4, CDKN2A, CHEK2, DICER1, EPCAM, GREM1, HOXB13, MEN1, MLH1, MSH2, MSH3, MSH6, MUTYH, NBN, NF1, NF2, NTHL1, PALB2, PMS2, POLD1, POLE, PTEN, RAD51C, RAD51D, RECQL, RET, SDHA, SDHAF2, SDHB, SDHC, SDHD, SMAD4, SMARCA4, STK11, TP53, TSC1, TSC2, and VHL.  RNA data is routinely analyzed for use in variant interpretation for all genes.       INTERVAL HISTORY:  Kathryn Kent is here for a follow up of metastatic pancreatic cancer. She was last  seen by NP Lacie on 11/27/21. She presents to the clinic accompanied by her daughter. She reports she is stable. She explains she experiences nausea, which decreases her appetite, weakness, increased heart rate for about  3-4 days after infusion. She denies pain. She reports she recovers well after day 4. She notes she is feeling better overall on treatment as her pain is gone.   All other systems were reviewed with the patient and are negative.  MEDICAL HISTORY:  Past Medical History:  Diagnosis Date   Anemia    Coronary artery disease cardiologist---dr berry   cardiac cath --   10/16/05 revealing noncritical CAD   Cyst of left kidney    small   Diverticulosis    Heart murmur    History of colon polyps    History of kidney stones    Hyperlipidemia    Hypertension    Leiomyoma of uterus    Liver cyst    small   OSA on CPAP    Osteoarthritis    Periumbilical hernia    Moderate   PMB (postmenopausal bleeding)    Vulvar cyst    Wears dentures    upper    SURGICAL HISTORY: Past Surgical History:  Procedure Laterality Date   APPENDECTOMY  age 40   and removal cyst   CARDIAC CATHETERIZATION  10-16-2005   dr berry   nonobstructive cad w/ mid segmental LAD 30-40% ,  normal LVF   COLONOSCOPY  06/24/2017   CYSTOSCOPY W/ URETERAL STENT PLACEMENT Right 01/03/2017   Procedure: CYSTOSCOPY WITH RETROGRADE PYELOGRAM/ URETEROSCOPY/ STONE BASKETRY/URETERAL STENT PLACEMENT;  Surgeon: Cleon Gustin, MD;  Location: [facility];  Service: Urology;  Laterality: Right;   DILATATION & CURETTAGE/HYSTEROSCOPY WITH MYOSURE N/A 12/30/2018   Procedure: [place];  Surgeon: Megan Salon, MD;  Location: [facility];  Service: Gynecology;  Laterality: N/A;   FOOT ARTHRODESIS Bilateral 1987   great toe's   great toe surgery Bilateral    HOLMIUM LASER APPLICATION Right 02/21/9406   Procedure: HOLMIUM LASER APPLICATION;  Surgeon: Cleon Gustin, MD;  Location: [facility];  Service: Urology;  Laterality: Right;   IR IMAGING GUIDED PORT INSERTION  09/22/2021   TOTAL KNEE ARTHROPLASTY Right 01/20/2018   Procedure: RIGHT TOTAL KNEE  ARTHROPLASTY;  Surgeon: Gaynelle Arabian, MD;  Location: [facility];  Service: Orthopedics;  Laterality: Right;  with block    I have reviewed the social history and family history with the patient and they are unchanged from previous note.  ALLERGIES:  is allergic to lipitor [atorvastatin].  MEDICATIONS:  Current Outpatient Medications  Medication Sig Dispense Refill   acetaminophen (TYLENOL) 500 MG tablet Take 1,000 mg by mouth every 6 (six) hours as needed for mild pain.     amLODipine (NORVASC) 10 MG tablet Take 10 mg by mouth daily.      chlorthalidone (HYGROTON) 25 MG tablet Take 0.5 tablets by mouth daily.      dicyclomine (BENTYL) 20 MG tablet Take 1 tablet (20 mg total) by mouth 2 (two) times daily. 20 tablet 0   ezetimibe (ZETIA) 10 MG tablet Take 10 mg by mouth daily.     ibuprofen (ADVIL) 600 MG tablet Take 1 tablet (600 mg total) by mouth every 8 (eight) hours as needed. 30 tablet 0   irbesartan (AVAPRO) 300 MG tablet Take 1 tablet by mouth daily.   1   lidocaine-prilocaine (EMLA) cream Apply to affected area once  30 g 3   montelukast (SINGULAIR) 10 MG tablet Take 1 tablet by mouth at bedtime.     Multiple Vitamin (MULTIVITAMIN) tablet Take 1 tablet by mouth daily.     ondansetron (ZOFRAN) 8 MG tablet Take 1 tablet (8 mg total) by mouth 2 (two) times daily as needed (Nausea or vomiting). 30 tablet 1   potassium chloride (KLOR-CON M) 10 MEQ tablet Take 1 tablet (10 mEq total) by mouth daily. 30 tablet 1   prochlorperazine (COMPAZINE) 10 MG tablet Take 1 tablet (10 mg total) by mouth every 6 (six) hours as needed (Nausea or vomiting). 30 tablet 1   simvastatin (ZOCOR) 40 MG tablet Take 20 mg by mouth 2 (two) times daily.      traMADol (ULTRAM) 50 MG tablet Take 1 tablet (50 mg total) by mouth every 6 (six) hours as needed. 20 tablet 0   No current facility-administered medications for this visit.    PHYSICAL EXAMINATION: ECOG PERFORMANCE STATUS: 1 - Symptomatic but completely  ambulatory  Vitals:   12/12/21 1229  BP: 135/85  Pulse: 88  Resp: 18  Temp: 98.6 F (37 C)  SpO2: 100%   Wt Readings from Last 3 Encounters:  12/12/21 176 lb 11.2 oz (80.2 kg)  11/27/21 179 lb (81.2 kg)  11/13/21 179 lb 9.6 oz (81.5 kg)     GENERAL:alert, no distress and comfortable SKIN: skin color normal, no rashes or significant lesions EYES: normal, Conjunctiva are pink and non-injected, sclera clear  NEURO: alert & oriented x 3 with fluent speech  LABORATORY DATA:  I have reviewed the data as listed    Latest Ref Rng & Units 12/12/2021   12:03 PM 11/27/2021   11:36 AM 11/13/2021    9:00 AM  CBC  WBC 4.0 - 10.5 K/uL 5.3  5.2  5.6   Hemoglobin 12.0 - 15.0 g/dL 11.9  11.6  11.4   Hematocrit 36.0 - 46.0 % 35.5  35.3  34.1   Platelets 150 - 400 K/uL 168  173  176         Latest Ref Rng & Units 12/12/2021   12:03 PM 11/27/2021   11:36 AM 11/13/2021    9:00 AM  CMP  Glucose 70 - 99 mg/dL 135  149  158   BUN 8 - 23 mg/dL '19  24  19   ' Creatinine 0.44 - 1.00 mg/dL 0.76  0.66  0.69   Sodium 135 - 145 mmol/L 135  138  137   Potassium 3.5 - 5.1 mmol/L 3.7  3.8  3.5   Chloride 98 - 111 mmol/L 100  101  101   CO2 22 - 32 mmol/L '30  29  28   ' Calcium 8.9 - 10.3 mg/dL 10.1  10.1  9.7   Total Protein 6.5 - 8.1 g/dL 8.0  7.5  7.6   Total Bilirubin 0.3 - 1.2 mg/dL 0.9  0.7  0.6   Alkaline Phos 38 - 126 U/L 68  85  76   AST 15 - 41 U/L '21  24  19   ' ALT 0 - 44 U/L '25  28  28       ' RADIOGRAPHIC STUDIES: I have personally reviewed the radiological images as listed and agreed with the findings in the report. No results found.    No orders of the defined types were placed in this encounter.  All questions were answered. The patient knows to call the clinic with any problems, questions or concerns. No  barriers to learning was detected. The total time spent in the appointment was 30 minutes.     Truitt Merle, MD 12/12/2021   I, Wilburn Mylar, am acting as scribe for Truitt Merle,  MD.   I have reviewed the above documentation for accuracy and completeness, and I agree with the above.

## 2021-12-12 NOTE — Patient Instructions (Signed)
[facility] ONCOLOGY  Discharge Instructions: Thank you for choosing [facility] to provide your oncology and hematology care.   If you have a lab appointment with the [facility], please go directly to the [facility] and check in at the registration area.   Wear comfortable clothing and clothing appropriate for easy access to any Portacath or PICC line.   We strive to give you quality time with your provider. You may need to reschedule your appointment if you arrive late (15 or more minutes).  Arriving late affects you and other patients whose appointments are after yours.  Also, if you miss three or more appointments without notifying the office, you may be dismissed from the clinic at the provider's discretion.      For prescription refill requests, have your pharmacy contact our office and allow 72 hours for refills to be completed.    Today you received the following chemotherapy and/or immunotherapy agents abraxane, gemcitabine      To help prevent nausea and vomiting after your treatment, we encourage you to take your nausea medication as directed.  BELOW ARE SYMPTOMS THAT SHOULD BE REPORTED IMMEDIATELY: *FEVER GREATER THAN 100.4 F (38 C) OR HIGHER *CHILLS OR SWEATING *NAUSEA AND VOMITING THAT IS NOT CONTROLLED WITH YOUR NAUSEA MEDICATION *UNUSUAL SHORTNESS OF BREATH *UNUSUAL BRUISING OR BLEEDING *URINARY PROBLEMS (pain or burning when urinating, or frequent urination) *BOWEL PROBLEMS (unusual diarrhea, constipation, pain near the anus) TENDERNESS IN MOUTH AND THROAT WITH OR WITHOUT PRESENCE OF ULCERS (sore throat, sores in mouth, or a toothache) UNUSUAL RASH, SWELLING OR PAIN  UNUSUAL VAGINAL DISCHARGE OR ITCHING   Items with * indicate a potential emergency and should be followed up as soon as possible or go to the Emergency Department if any problems should occur.  Please show the CHEMOTHERAPY ALERT CARD or IMMUNOTHERAPY ALERT CARD at  check-in to the Emergency Department and triage nurse.  Should you have questions after your visit or need to cancel or reschedule your appointment, please contact [facility]  Dept: [phone]  and follow the prompts.  Office hours are 8:00 a.m. to 4:30 p.m. Monday - Friday. Please note that voicemails left after 4:00 p.m. may not be returned until the following business day.  We are closed weekends and major holidays. You have access to a nurse at all times for urgent questions. Please call the main number to the clinic Dept: [phone] and follow the prompts.   For any non-urgent questions, you may also contact your provider using MyChart. We now offer e-Visits for anyone 12 and older to request care online for non-urgent symptoms. For details visit [email or web address].   Also download the MyChart app! Go to the app store, search "MyChart", open the app, select [facility], and log in with your MyChart username and password.  Due to Covid, a mask is required upon entering the hospital/clinic. If you do not have a mask, one will be given to you upon arrival. For doctor visits, patients may have 1 support person aged 24 or older with them. For treatment visits, patients cannot have anyone with them due to current Covid guidelines and our immunocompromised population.

## 2021-12-14 ENCOUNTER — Telehealth: Payer: Self-pay | Admitting: Hematology

## 2021-12-14 NOTE — Telephone Encounter (Signed)
Left message with follow-up appointments per 6/20 los.

## 2021-12-19 ENCOUNTER — Other Ambulatory Visit: Payer: Self-pay

## 2021-12-19 DIAGNOSIS — C252 Malignant neoplasm of tail of pancreas: Secondary | ICD-10-CM

## 2021-12-24 NOTE — Progress Notes (Addendum)
Kathryn Kent   [phone] [phone]   Clinic Follow up Note   Patient Care Team: Crist Infante, MD as PCP - General (Internal Medicine) Lorretta Harp, MD as PCP - Cardiology (Cardiology) Truitt Merle, MD as Consulting Physician (Oncology) Royston Bake, RN as Oncology Nurse Navigator (Oncology) Care, [facility] 12/25/2021  CHIEF COMPLAINT: Follow up metastatic pancreas cancer  SUMMARY OF ONCOLOGIC HISTORY: Oncology History  Pancreatic cancer ([place])  08/30/2021 Imaging   EXAM: CT ABDOMEN AND PELVIS WITH CONTRAST  IMPRESSION: 1. Infiltrative mass in the pancreatic tail which measures 4.7 x 4.4 cm, highly suspicious for pancreatic adenocarcinoma. 2. The mass extends into the splenic hilum with findings suspicious for involvement of the splenic parenchyma as well as abutment with possible invasion of the colonic splenic flexure. 3. Extensive omental and peritoneal nodularity with trace abdominopelvic free fluid, most consistent with carcinomatosis. 4. Right cardiophrenic adenopathy and right sided pulmonary nodules measuring up to 11 mm, concerning for metastatic disease. 5. Central filling defect within an enlarged left common femoral vein, consistent with nonocclusive deep venous thrombosis. 6. Symmetric wall thickening of the distal esophagus with reflux versus retract retained contrast in the distal esophagus. Correlate for esophagitis. 7. Hypodense hepatic lesions measure up to 1 cm in the right lower lobe appear to have been present on prior non contrasted CT and likely reflect cysts. No new discrete hepatic lesion identified.  8. Lobular uterine contour with numerous dystrophic calcifications likely reflecting uterine leiomyomas. 9.  Aortic Atherosclerosis (ICD10-I70.0).   09/06/2021 Imaging   EXAM: CT CHEST WITH CONTRAST  IMPRESSION: 1. Fissural nodularity and fissural thickening throughout the right lung  concerning for metastatic disease.   2. Additionally there are bilateral pulmonary nodules in the lower lungs, right greater than left, measuring up to 11 mm suspicious for metastatic disease.   3.  Small right pleural effusion.   4. Enlarged right cardiophrenic lymph nodes suspicious for metastatic disease.   5. Subcentimeter incidental left thyroid nodule with heterogeneous and enlarged thyroid. Recommend thyroid US. Reference: J Am Coll Radiol. 2015 Feb;12(2): 143-50   4. Pancreatic mass, peritoneal metastases and hypodense liver lesions are unchanged from 08/30/2021.   09/12/2021 Initial Biopsy   FINAL MICROSCOPIC DIAGNOSIS:   A.   OMENTAL MASS, NEEDLE CORE BIOPSY:  -    Metastatic adenocarcinoma, see Comment.   COMMENT:  The adenocarcinoma was interrogated with immunohistochemical (IHC) stains (CK7, CK20, PAX8, CDX-2, GATA-3). The malignant cells are CK7 positive, CK20 negative and have weak CDX-2 reactivity. The tumor cells are negative for GATA-3 (breast marker) and have weak/equivocal nuclear reactivity with PAX8.  This immunophenotype is non-specific, but is compatible with a pancreatic ductal primary, which is the favored primary site based on imaging studies. The controls are satisfactory.    09/12/2021 Cancer Staging   Staging form: Exocrine Pancreas, AJCC 8th Edition - Clinical stage from 09/12/2021: Stage IV (cT3, cN0, pM1) - Signed by Truitt Merle, MD on 10/30/2021 Stage prefix: Initial diagnosis Total positive nodes: 0   09/15/2021 Initial Diagnosis   Pancreatic cancer ([place])   09/25/2021 -  Chemotherapy   Patient is on Treatment Plan : PANCREATIC Abraxane / Gemcitabine D1,8, q21d      Genetic Testing   Negative genetic testing. No pathogenic variants identified on the [facility] BRCA1/2 STAT+ CustomNext+RNA panel. The report date is 10/02/2021.  The CustomNext-Cancer+RNAinsight panel offered by [facility] includes sequencing and rearrangement analysis for the following 47  genes:  APC, ATM, AXIN2, BARD1, BMPR1A, BRCA1, BRCA2, BRIP1, CDH1, CDK4, CDKN2A, CHEK2, DICER1, EPCAM, GREM1, HOXB13, MEN1, MLH1, MSH2, MSH3, MSH6, MUTYH, NBN, NF1, NF2, NTHL1, PALB2, PMS2, POLD1, POLE, PTEN, RAD51C, RAD51D, RECQL, RET, SDHA, SDHAF2, SDHB, SDHC, SDHD, SMAD4, SMARCA4, STK11, TP53, TSC1, TSC2, and VHL.  RNA data is routinely analyzed for use in variant interpretation for all genes.      CURRENT THERAPY: Gemcitabine, started 09/25/21             -Abraxane added with C2 (10/16/21). Currently given q2 weeks   INTERVAL HISTORY: Kathryn Kent returns for follow up as scheduled. Last seen by Dr. Burr Medico 12/12/21 and completed another cycle of gemcitabine/abraxane.  She has been tolerating chemo well and felt normal until this morning, woke up not feeling well, weak, constipated, and "warm" without documented fever.  She is here today in a wheelchair, has been walking in clinic previously.  Denies cough, chest pain, leg edema, pain or dyspnea.  Her appetite is low, asking for something to help her eat.  All other systems were reviewed with the patient and are negative.  MEDICAL HISTORY:  Past Medical History:  Diagnosis Date   Anemia    Coronary artery disease cardiologist---dr berry   cardiac cath --   10/16/05 revealing noncritical CAD   Cyst of left kidney    small   Diverticulosis    Heart murmur    History of colon polyps    History of kidney stones    Hyperlipidemia    Hypertension    Leiomyoma of uterus    Liver cyst    small   OSA on CPAP    Osteoarthritis    Periumbilical hernia    Moderate   PMB (postmenopausal bleeding)    Vulvar cyst    Wears dentures    upper    SURGICAL HISTORY: Past Surgical History:  Procedure Laterality Date   APPENDECTOMY  age 50   and removal cyst   CARDIAC CATHETERIZATION  10-16-2005   dr berry   nonobstructive cad w/ mid segmental LAD 30-40% ,  normal LVF   COLONOSCOPY  06/24/2017   CYSTOSCOPY W/ URETERAL STENT PLACEMENT Right 01/03/2017    Procedure: CYSTOSCOPY WITH RETROGRADE PYELOGRAM/ URETEROSCOPY/ STONE BASKETRY/URETERAL STENT PLACEMENT;  Surgeon: Cleon Gustin, MD;  Location: [facility];  Service: Urology;  Laterality: Right;   DILATATION & CURETTAGE/HYSTEROSCOPY WITH MYOSURE N/A 12/30/2018   Procedure: [place];  Surgeon: Megan Salon, MD;  Location: [facility];  Service: Gynecology;  Laterality: N/A;   FOOT ARTHRODESIS Bilateral 1987   great toe's   great toe surgery Bilateral    HOLMIUM LASER APPLICATION Right 2/69/4854   Procedure: HOLMIUM LASER APPLICATION;  Surgeon: Cleon Gustin, MD;  Location: [facility];  Service: Urology;  Laterality: Right;   IR IMAGING GUIDED PORT INSERTION  09/22/2021   TOTAL KNEE ARTHROPLASTY Right 01/20/2018   Procedure: RIGHT TOTAL KNEE ARTHROPLASTY;  Surgeon: Gaynelle Arabian, MD;  Location: [facility];  Service: Orthopedics;  Laterality: Right;  with block    I have reviewed the social history and family history with the patient and they are unchanged from previous note.  ALLERGIES:  is allergic to lipitor [atorvastatin].  MEDICATIONS:  Current Outpatient Medications  Medication Sig Dispense Refill   acetaminophen (TYLENOL) 500 MG tablet Take 1,000 mg by mouth every 6 (six) hours as needed for mild pain.     amLODipine (NORVASC) 10 MG tablet  Take 10 mg by mouth daily.      chlorthalidone (HYGROTON) 25 MG tablet Take 0.5 tablets by mouth daily.      dicyclomine (BENTYL) 20 MG tablet Take 1 tablet (20 mg total) by mouth 2 (two) times daily. 20 tablet 0   ezetimibe (ZETIA) 10 MG tablet Take 10 mg by mouth daily.     ibuprofen (ADVIL) 600 MG tablet Take 1 tablet (600 mg total) by mouth every 8 (eight) hours as needed. 30 tablet 0   irbesartan (AVAPRO) 300 MG tablet Take 1 tablet by mouth daily.   1   lidocaine-prilocaine (EMLA) cream Apply to affected area once 30 g 3   montelukast (SINGULAIR)  10 MG tablet Take 1 tablet by mouth at bedtime.     Multiple Vitamin (MULTIVITAMIN) tablet Take 1 tablet by mouth daily.     ondansetron (ZOFRAN) 8 MG tablet Take 1 tablet (8 mg total) by mouth 2 (two) times daily as needed (Nausea or vomiting). 30 tablet 1   potassium chloride (KLOR-CON M) 10 MEQ tablet Take 1 tablet (10 mEq total) by mouth daily. 30 tablet 1   prochlorperazine (COMPAZINE) 10 MG tablet Take 1 tablet (10 mg total) by mouth every 6 (six) hours as needed (Nausea or vomiting). 30 tablet 1   simvastatin (ZOCOR) 40 MG tablet Take 20 mg by mouth 2 (two) times daily.      traMADol (ULTRAM) 50 MG tablet Take 1 tablet (50 mg total) by mouth every 6 (six) hours as needed. 20 tablet 0   No current facility-administered medications for this visit.    PHYSICAL EXAMINATION: ECOG PERFORMANCE STATUS: 1 - Symptomatic but completely ambulatory  Vitals:   12/25/21 1028  BP: 134/78  Pulse: 94  Resp: 15  Temp: 98.3 F (36.8 C)  SpO2: 90%   O2 sat 88-90 on room air at rest, conversational. Confirmed pulse ox on multiple fingers O2 sat 86-88% on short distance ambulation with HR up to 126. HR normalized to 90s at rest Patient became dyspneic, tachypneic, and tachycardic during short distance ambulation ~20 ft  Filed Weights   12/25/21 1028  Weight: 177 lb 9.6 oz (80.6 kg)    GENERAL:alert, no distress and comfortable SKIN: No rash EYES:  sclera clear LUNGS: clear with normal breathing effort at rest. Increased WOB on minimal exertion HEART: Tachycardic, regular rhythm, no lower extremity edema ABDOMEN:abdomen soft, non-tender  NEURO: alert & oriented x 3 with fluent speech, ambulates with cane  LABORATORY DATA:  I have reviewed the data as listed    Latest Ref Rng & Units 12/25/2021   10:01 AM 12/12/2021   12:03 PM 11/27/2021   11:36 AM  CBC  WBC 4.0 - 10.5 K/uL 5.9  5.3  5.2   Hemoglobin 12.0 - 15.0 g/dL 11.8  11.9  11.6   Hematocrit 36.0 - 46.0 % 35.5  35.5  35.3   Platelets  150 - 400 K/uL 152  168  173         Latest Ref Rng & Units 12/25/2021   10:01 AM 12/12/2021   12:03 PM 11/27/2021   11:36 AM  CMP  Glucose 70 - 99 mg/dL 150  135  149   BUN 8 - 23 mg/dL _0 Creatinine 0.44 - 1.00 mg/dL 0.61  0.76  0.66   Sodium 135 - 145 mmol/L 138  135  138   Potassium 3.5 - 5.1 mmol/L 3.6  3.7  3.8  Chloride 98 - 111 mmol/L 103  100  101   CO2 22 - 32 mmol/L _0 Calcium 8.9 - 10.3 mg/dL 10.0  10.1  10.1   Total Protein 6.5 - 8.1 g/dL 7.8  8.0  7.5   Total Bilirubin 0.3 - 1.2 mg/dL 0.7  0.9  0.7   Alkaline Phos 38 - 126 U/L 85  68  85   AST 15 - 41 U/L _1 ALT 0 - 44 U/L _2 RADIOGRAPHIC STUDIES: I have personally reviewed the radiological images as listed and agreed with the findings in the report. No results found.   ASSESSMENT & PLAN: Kathryn Kent is a 86 y.o. female with     Hypoxia, tachypnea, tachycardia -O2 sat normally 100% on RA. Today o2 88-90% on RA at rest, talking. Dropped to 86 on short distance ambulation ~20 ft with tachypnea and tachycardia to 126;  -mild anemia is stable, hgb 11.8 likely noncontributory  -she had small pleural effusion on staging CT chest in 08/2021 -Given high risk factors including pancreatic cancer on chemo, sending to ED for urgent work-up including CTA to rule out PE. My nurse spoke to charge RN and transported her in a wheelchair -The differential also includes worsening pleural effusion, infection, disease progression in the lungs (although usually not acute like this), or other acute cardiopulm process  2. Metastatic Pancreatic Cancer to peritoneum and lungs, stage IV  -presented to PCP with rectal, vaginal, and pelvic area pain, as well as positive Hemoccult stool test. CT AP on 08/30/21 showed 4.7 cm pancreatic tail mass extending into splenic hilum and suspicious for involvement of splenic parenchyma and abutment with possible invasion of colonic splenic flexure;  extensive omental and peritoneal nodularity; right cardiophrenic adenopathy and right-sided pulmonary nodules. -baseline tumor markers on 09/04/21 were all elevated-- CEA of 24.71, CA 125 of 67.7, and CA 19.9 of 1,208. -staging chest CT on 09/06/21 showed: nodularity and thickening throughout right lung; bilateral pulmonary nodules in lower lungs, R>L, up to 11 mm; small right pleural effusion; enlarged right cardiophrenic lymph nodes. -biopsy of omental mass on 09/12/21 confirmed metastatic adenocarcinoma, compatible with pancreatic primary based on imaging. -she began first cycle chemo with single agent gemcitabine on 09/25/21. She tolerated well overall. Abraxane was added with cycle 2. She is now receiving treatment every 2 weeks. She is tolerating very well and her abdominal pain has resolved  -Restaging CT AP 12/09/2021 showed stable disease.CA 19-9 fluctuates.  Continue same treatment plan  -Due for next cycle today, will hold pending further work-up see #1   3. Weight Loss, Constipation, Pain -secondary to #1 -chart review shows weight loss of about 20 lbs from 10/2020 - 08/2021. -Continue follow-up with dietitian -she is on tramadol for abdominal, armpit, and groin pains. -she was prescribed bentyl in the ED on 10/18/21 for constipation. -Constipation stable on chemo, well managed with MiraLAX as needed  -We discussed mirtazapine versus Marinol versus Megace, patient prefers mirtazapine but will hold pending further work-up see #1, do not want to start new medication at this time  4. Thyroid Nodule -incidental subcentimeter left thyroid nodule with heterogeneous and enlarged thyroid on chest CT 09/06/21.   5. Goal of care discussion -Treatment goal of chemo is palliative  -Full code     PLAN: -ED for hypoxia and tachycardia, r/o PE or other acute process -hold chemo  today -will follow closely    All questions were answered. The patient knows to call the clinic with any problems,  questions or concerns. No barriers to learning were detected.      Alla Feeling, NP 12/25/21

## 2021-12-25 ENCOUNTER — Encounter (HOSPITAL_COMMUNITY): Payer: Self-pay | Admitting: *Deleted

## 2021-12-25 ENCOUNTER — Emergency Department (HOSPITAL_COMMUNITY): Payer: Medicare Other

## 2021-12-25 ENCOUNTER — Inpatient Hospital Stay: Payer: Medicare Other

## 2021-12-25 ENCOUNTER — Other Ambulatory Visit: Payer: Self-pay

## 2021-12-25 ENCOUNTER — Encounter: Payer: Self-pay | Admitting: Nurse Practitioner

## 2021-12-25 ENCOUNTER — Inpatient Hospital Stay (HOSPITAL_COMMUNITY)
Admission: EM | Admit: 2021-12-25 | Discharge: 2021-12-30 | DRG: 175 | Disposition: A | Discharge status: Skilled Nursing Facility | Payer: Medicare Other | Source: Ambulatory Visit | Attending: Internal Medicine | Admitting: Internal Medicine

## 2021-12-25 ENCOUNTER — Inpatient Hospital Stay: Payer: Medicare Other | Attending: Physician Assistant | Admitting: Nurse Practitioner

## 2021-12-25 ENCOUNTER — Emergency Department (HOSPITAL_BASED_OUTPATIENT_CLINIC_OR_DEPARTMENT_OTHER)
Admit: 2021-12-25 | Discharge: 2021-12-25 | Disposition: A | Discharge status: Home / Self Care | Payer: Medicare Other | Attending: Emergency Medicine | Admitting: Emergency Medicine

## 2021-12-25 VITALS — BP 134/78 | HR 94 | Temp 98.3°F | Resp 15 | Ht 64.0 in | Wt 177.6 lb

## 2021-12-25 DIAGNOSIS — M1990 Unspecified osteoarthritis, unspecified site: Secondary | ICD-10-CM | POA: Diagnosis present

## 2021-12-25 DIAGNOSIS — I2699 Other pulmonary embolism without acute cor pulmonale: Secondary | ICD-10-CM | POA: Diagnosis not present

## 2021-12-25 DIAGNOSIS — J90 Pleural effusion, not elsewhere classified: Secondary | ICD-10-CM | POA: Insufficient documentation

## 2021-12-25 DIAGNOSIS — C786 Secondary malignant neoplasm of retroperitoneum and peritoneum: Secondary | ICD-10-CM | POA: Insufficient documentation

## 2021-12-25 DIAGNOSIS — R000 Tachycardia, unspecified: Secondary | ICD-10-CM | POA: Insufficient documentation

## 2021-12-25 DIAGNOSIS — R262 Difficulty in walking, not elsewhere classified: Secondary | ICD-10-CM | POA: Diagnosis present

## 2021-12-25 DIAGNOSIS — Z20822 Contact with and (suspected) exposure to covid-19: Secondary | ICD-10-CM | POA: Diagnosis present

## 2021-12-25 DIAGNOSIS — R0602 Shortness of breath: Secondary | ICD-10-CM | POA: Diagnosis not present

## 2021-12-25 DIAGNOSIS — I10 Essential (primary) hypertension: Secondary | ICD-10-CM | POA: Diagnosis present

## 2021-12-25 DIAGNOSIS — C7802 Secondary malignant neoplasm of left lung: Secondary | ICD-10-CM | POA: Insufficient documentation

## 2021-12-25 DIAGNOSIS — C252 Malignant neoplasm of tail of pancreas: Secondary | ICD-10-CM

## 2021-12-25 DIAGNOSIS — E785 Hyperlipidemia, unspecified: Secondary | ICD-10-CM | POA: Diagnosis present

## 2021-12-25 DIAGNOSIS — I2510 Atherosclerotic heart disease of native coronary artery without angina pectoris: Secondary | ICD-10-CM | POA: Diagnosis present

## 2021-12-25 DIAGNOSIS — Z79899 Other long term (current) drug therapy: Secondary | ICD-10-CM

## 2021-12-25 DIAGNOSIS — C259 Malignant neoplasm of pancreas, unspecified: Secondary | ICD-10-CM | POA: Diagnosis present

## 2021-12-25 DIAGNOSIS — M7989 Other specified soft tissue disorders: Secondary | ICD-10-CM

## 2021-12-25 DIAGNOSIS — I824Y1 Acute embolism and thrombosis of unspecified deep veins of right proximal lower extremity: Secondary | ICD-10-CM

## 2021-12-25 DIAGNOSIS — J45909 Unspecified asthma, uncomplicated: Secondary | ICD-10-CM | POA: Diagnosis present

## 2021-12-25 DIAGNOSIS — K5790 Diverticulosis of intestine, part unspecified, without perforation or abscess without bleeding: Secondary | ICD-10-CM | POA: Diagnosis present

## 2021-12-25 DIAGNOSIS — I2609 Other pulmonary embolism with acute cor pulmonale: Principal | ICD-10-CM | POA: Diagnosis present

## 2021-12-25 DIAGNOSIS — D649 Anemia, unspecified: Secondary | ICD-10-CM | POA: Insufficient documentation

## 2021-12-25 DIAGNOSIS — Z888 Allergy status to other drugs, medicaments and biological substances status: Secondary | ICD-10-CM

## 2021-12-25 DIAGNOSIS — R531 Weakness: Secondary | ICD-10-CM | POA: Diagnosis present

## 2021-12-25 DIAGNOSIS — I82409 Acute embolism and thrombosis of unspecified deep veins of unspecified lower extremity: Secondary | ICD-10-CM | POA: Diagnosis present

## 2021-12-25 DIAGNOSIS — K5900 Constipation, unspecified: Secondary | ICD-10-CM | POA: Diagnosis present

## 2021-12-25 DIAGNOSIS — E041 Nontoxic single thyroid nodule: Secondary | ICD-10-CM | POA: Insufficient documentation

## 2021-12-25 DIAGNOSIS — C7801 Secondary malignant neoplasm of right lung: Secondary | ICD-10-CM | POA: Insufficient documentation

## 2021-12-25 DIAGNOSIS — G4733 Obstructive sleep apnea (adult) (pediatric): Secondary | ICD-10-CM | POA: Diagnosis present

## 2021-12-25 DIAGNOSIS — R0682 Tachypnea, not elsewhere classified: Secondary | ICD-10-CM | POA: Insufficient documentation

## 2021-12-25 DIAGNOSIS — Z96651 Presence of right artificial knee joint: Secondary | ICD-10-CM | POA: Diagnosis present

## 2021-12-25 DIAGNOSIS — Z972 Presence of dental prosthetic device (complete) (partial): Secondary | ICD-10-CM

## 2021-12-25 DIAGNOSIS — Z95828 Presence of other vascular implants and grafts: Secondary | ICD-10-CM

## 2021-12-25 DIAGNOSIS — I82413 Acute embolism and thrombosis of femoral vein, bilateral: Secondary | ICD-10-CM | POA: Diagnosis present

## 2021-12-25 DIAGNOSIS — Z66 Do not resuscitate: Secondary | ICD-10-CM | POA: Diagnosis present

## 2021-12-25 DIAGNOSIS — R5381 Other malaise: Secondary | ICD-10-CM | POA: Diagnosis present

## 2021-12-25 DIAGNOSIS — K8689 Other specified diseases of pancreas: Secondary | ICD-10-CM | POA: Diagnosis present

## 2021-12-25 DIAGNOSIS — Z87442 Personal history of urinary calculi: Secondary | ICD-10-CM

## 2021-12-25 LAB — COMPREHENSIVE METABOLIC PANEL
ALT: 22 U/L (ref 0–44)
ALT: 25 U/L (ref 0–44)
AST: 17 U/L (ref 15–41)
AST: 18 U/L (ref 15–41)
Albumin: 3.7 g/dL (ref 3.5–5.0)
Albumin: 3.5 g/dL (ref 3.5–5.0)
Alkaline Phosphatase: 85 U/L (ref 38–126)
Alkaline Phosphatase: 76 U/L (ref 38–126)
Anion gap: 7 (ref 5–15)
Anion gap: 7 (ref 5–15)
BUN: 21 mg/dL (ref 8–23)
BUN: 23 mg/dL (ref 8–23)
CO2: 28 mmol/L (ref 22–32)
CO2: 27 mmol/L (ref 22–32)
Calcium: 10 mg/dL (ref 8.9–10.3)
Calcium: 9.6 mg/dL (ref 8.9–10.3)
Chloride: 103 mmol/L (ref 98–111)
Chloride: 105 mmol/L (ref 98–111)
Creatinine, Ser: 0.61 mg/dL (ref 0.44–1.00)
Creatinine, Ser: 0.5 mg/dL (ref 0.44–1.00)
GFR, Estimated: >60 mL/min (ref >60)
GFR, Estimated: >60 mL/min (ref >60)
Glucose, Bld: 150 mg/dL — ABNORMAL HIGH (ref 70–99)
Glucose, Bld: 139 mg/dL — ABNORMAL HIGH (ref 70–99)
Potassium: 3.6 mmol/L (ref 3.5–5.1)
Potassium: 3.7 mmol/L (ref 3.5–5.1)
Sodium: 138 mmol/L (ref 135–145)
Sodium: 139 mmol/L (ref 135–145)
Total Bilirubin: 0.7 mg/dL (ref 0.3–1.2)
Total Bilirubin: 0.8 mg/dL (ref 0.3–1.2)
Total Protein: 7.8 g/dL (ref 6.5–8.1)
Total Protein: 7.5 g/dL (ref 6.5–8.1)

## 2021-12-25 LAB — CBC WITH DIFFERENTIAL/PLATELET
Abs Immature Granulocytes: 0.04 10*3/uL (ref 0.00–0.07)
Abs Immature Granulocytes: 0.03 10*3/uL (ref 0.00–0.07)
Basophils Absolute: 0 10*3/uL (ref 0.0–0.1)
Basophils Absolute: 0.1 10*3/uL (ref 0.0–0.1)
Basophils Relative: 1 %
Basophils Relative: 1 %
Eosinophils Absolute: 0.2 10*3/uL (ref 0.0–0.5)
Eosinophils Absolute: 0.1 10*3/uL (ref 0.0–0.5)
Eosinophils Relative: 3 %
Eosinophils Relative: 2 %
HCT: 35.5 % — ABNORMAL LOW (ref 36.0–46.0)
HCT: 35.9 % — ABNORMAL LOW (ref 36.0–46.0)
Hemoglobin: 11.8 g/dL — ABNORMAL LOW (ref 12.0–15.0)
Hemoglobin: 11.7 g/dL — ABNORMAL LOW (ref 12.0–15.0)
Immature Granulocytes: 1 %
Immature Granulocytes: 1 %
Lymphocytes Relative: 15 %
Lymphocytes Relative: 15 %
Lymphs Abs: 0.9 10*3/uL (ref 0.7–4.0)
Lymphs Abs: 0.9 10*3/uL (ref 0.7–4.0)
MCH: 30.1 pg (ref 26.0–34.0)
MCH: 30.8 pg (ref 26.0–34.0)
MCHC: 33.2 g/dL (ref 30.0–36.0)
MCHC: 32.6 g/dL (ref 30.0–36.0)
MCV: 90.6 fL (ref 80.0–100.0)
MCV: 94.5 fL (ref 80.0–100.0)
Monocytes Absolute: 0.6 10*3/uL (ref 0.1–1.0)
Monocytes Absolute: 0.7 10*3/uL (ref 0.1–1.0)
Monocytes Relative: 11 %
Monocytes Relative: 11 %
Neutro Abs: 4.2 10*3/uL (ref 1.7–7.7)
Neutro Abs: 4.4 10*3/uL (ref 1.7–7.7)
Neutrophils Relative %: 69 %
Neutrophils Relative %: 70 %
Platelets: 152 10*3/uL (ref 150–400)
Platelets: 148 10*3/uL — ABNORMAL LOW (ref 150–400)
RBC: 3.92 MIL/uL (ref 3.87–5.11)
RBC: 3.8 MIL/uL — ABNORMAL LOW (ref 3.87–5.11)
RDW: 15.9 % — ABNORMAL HIGH (ref 11.5–15.5)
RDW: 16 % — ABNORMAL HIGH (ref 11.5–15.5)
WBC: 5.9 10*3/uL (ref 4.0–10.5)
WBC: 6.1 10*3/uL (ref 4.0–10.5)
nRBC: 0 % (ref 0.0–0.2)
nRBC: 0 % (ref 0.0–0.2)

## 2021-12-25 LAB — HEPARIN LEVEL (UNFRACTIONATED): Heparin Unfractionated: 0.53 IU/mL (ref 0.30–0.70)

## 2021-12-25 LAB — RESP PANEL BY RT-PCR (FLU A&B, COVID) ARPGX2
Influenza A by PCR: NEGATIVE
Influenza B by PCR: NEGATIVE
SARS Coronavirus 2 by RT PCR: NEGATIVE

## 2021-12-25 LAB — TROPONIN I (HIGH SENSITIVITY)
Troponin I (High Sensitivity): 22 ng/L — ABNORMAL HIGH (ref <18)
Troponin I (High Sensitivity): 23 ng/L — ABNORMAL HIGH (ref <18)

## 2021-12-25 LAB — BRAIN NATRIURETIC PEPTIDE: B Natriuretic Peptide: 19.4 pg/mL (ref 0.0–100.0)

## 2021-12-25 MED ORDER — POLYETHYLENE GLYCOL 3350 17 G PO PACK
17.0000 g | PACK | Freq: Every day | ORAL | Status: DC | PRN
Start: 2021-12-25 — End: 2021-12-29
  Administered 2021-12-25 – 2021-12-27 (×2): 17 g via ORAL
  Filled 2021-12-25 (×2): qty 1

## 2021-12-25 MED ORDER — ACETAMINOPHEN 650 MG RE SUPP: 650.0000 mg | Freq: Four times a day (QID) | RECTAL | Status: DC | PRN

## 2021-12-25 MED ORDER — IRBESARTAN 300 MG PO TABS
300.0000 mg | ORAL_TABLET | Freq: Every day | ORAL | Status: DC
Start: 2021-12-26 — End: 2021-12-25

## 2021-12-25 MED ORDER — IOHEXOL 350 MG/ML SOLN
75.0000 mL | Freq: Once | INTRAVENOUS | Status: AC | PRN
  Administered 2021-12-25: 69 mL via INTRAVENOUS

## 2021-12-25 MED ORDER — EZETIMIBE 10 MG PO TABS
10.0000 mg | ORAL_TABLET | Freq: Every day | ORAL | Status: DC
  Administered 2021-12-25 – 2021-12-30 (×6): 10 mg via ORAL
  Filled 2021-12-25 (×6): qty 1

## 2021-12-25 MED ORDER — SIMVASTATIN 20 MG PO TABS
20.0000 mg | ORAL_TABLET | Freq: Every day | ORAL | Status: DC
  Administered 2021-12-25 – 2021-12-29 (×5): 20 mg via ORAL
  Filled 2021-12-25 (×5): qty 1

## 2021-12-25 MED ORDER — AMLODIPINE BESYLATE 5 MG PO TABS
10.0000 mg | ORAL_TABLET | Freq: Every day | ORAL | Status: DC
Start: 2021-12-26 — End: 2021-12-25

## 2021-12-25 MED ORDER — MONTELUKAST SODIUM 10 MG PO TABS
10.0000 mg | ORAL_TABLET | Freq: Every day | ORAL | Status: DC
  Administered 2021-12-25 – 2021-12-29 (×5): 10 mg via ORAL
  Filled 2021-12-25 (×5): qty 1

## 2021-12-25 MED ORDER — HEPARIN (PORCINE) 25000 UT/250ML-% IV SOLN
1050.0000 [IU]/h | INTRAVENOUS | Status: DC
  Administered 2021-12-25: 1050 [IU]/h via INTRAVENOUS
  Filled 2021-12-25 (×4): qty 250

## 2021-12-25 MED ORDER — ACETAMINOPHEN 325 MG PO TABS: 650.0000 mg | ORAL_TABLET | Freq: Four times a day (QID) | ORAL | Status: DC | PRN

## 2021-12-25 MED ORDER — SODIUM CHLORIDE (PF) 0.9 % IJ SOLN
INTRAMUSCULAR | Status: AC
  Filled 2021-12-25: qty 50

## 2021-12-25 MED ORDER — SODIUM CHLORIDE 0.9% FLUSH
10.0000 mL | Freq: Once | INTRAVENOUS | Status: AC
  Administered 2021-12-25: 10 mL

## 2021-12-25 MED ORDER — ONDANSETRON HCL 4 MG PO TABS
8.0000 mg | ORAL_TABLET | Freq: Two times a day (BID) | ORAL | Status: DC | PRN
  Administered 2021-12-25: 8 mg via ORAL
  Filled 2021-12-25: qty 2

## 2021-12-25 MED ORDER — HEPARIN BOLUS VIA INFUSION
2000.0000 [IU] | Freq: Once | INTRAVENOUS | Status: AC
Start: 2021-12-25 — End: 2021-12-25
  Administered 2021-12-25: 2000 [IU] via INTRAVENOUS
  Filled 2021-12-25: qty 2000

## 2021-12-25 NOTE — ED Provider Notes (Addendum)
[facility] DEPT Provider Note   CSN: [id number] Arrival date & time: 12/25/21  1113     History  Chief Complaint  Patient presents with   Annual Exam   R/O PE    Cydni Reddoch is a 86 y.o. female.  HPI  86 year old female with medical history significant for pancreatic cancer on chemotherapy who presents to the emergency department from her oncologist office due to concern for possible PE.  The patient states that she developed sudden onset shortness of breath this morning. She endorses generalized fatigue and malaise. She denies any lower extremity swelling but on checking in the exam room today she appears to have new right lower extremity swelling compared to left.  She endorses shortness of breath without chest pain.  She denies any cough, fever or chills.  At her oncologist office she was found to be saturating between 86 to 90% on room air and this was subsequently placed on oxygen and sent to the emergency department for further evaluation.    Home Medications Prior to Admission medications   Medication Sig Start Date End Date Taking? Authorizing Provider  amLODipine (NORVASC) 10 MG tablet Take 10 mg by mouth daily.    Yes [provider]  chlorthalidone (HYGROTON) 25 MG tablet Take 0.5 tablets by mouth daily.  03/12/16  Yes [provider]  dicyclomine (BENTYL) 20 MG tablet Take 1 tablet (20 mg total) by mouth 2 (two) times daily. 10/18/21  Yes Harris, Abigail, PA-C  ezetimibe (ZETIA) 10 MG tablet Take 10 mg by mouth daily. 10/17/20  Yes [provider]  irbesartan (AVAPRO) 300 MG tablet Take 1 tablet by mouth daily.  10/16/17  Yes [provider]  lidocaine-prilocaine (EMLA) cream Apply to affected area once Patient taking differently: 1 Application daily as needed (access port). 09/15/21  Yes Truitt Merle, MD  montelukast (SINGULAIR) 10 MG tablet Take 1 tablet by mouth at bedtime. 12/09/18  Yes [provider]  Multiple Vitamin (MULTIVITAMIN) tablet Take 1 tablet by mouth daily.   Yes [provider]  ondansetron (ZOFRAN) 8 MG tablet Take 1 tablet (8 mg total) by mouth 2 (two) times daily as needed (Nausea or vomiting). 12/12/21  Yes Truitt Merle, MD  potassium chloride (KLOR-CON M) 10 MEQ tablet Take 1 tablet (10 mEq total) by mouth daily. 12/12/21  Yes Truitt Merle, MD  prochlorperazine (COMPAZINE) 10 MG tablet Take 1 tablet (10 mg total) by mouth every 6 (six) hours as needed (Nausea or vomiting). 12/12/21  Yes Truitt Merle, MD  simvastatin (ZOCOR) 40 MG tablet Take 20 mg by mouth daily.   Yes [provider]  ibuprofen (ADVIL) 600 MG tablet Take 1 tablet (600 mg total) by mouth every 8 (eight) hours as needed. Patient not taking: Reported on 12/25/2021 12/30/18   Megan Salon, MD  traMADol (ULTRAM) 50 MG tablet Take 1 tablet (50 mg total) by mouth every 6 (six) hours as needed. Patient taking differently: Take 50 mg by mouth every 6 (six) hours as needed for moderate pain. 10/02/21   Truitt Merle, MD      Allergies    Lipitor [atorvastatin]    Review of Systems   Review of Systems  Respiratory:  Positive for shortness of breath.   All other systems reviewed and are negative.   Physical Exam Updated Vital Signs BP 136/80   Pulse 86   Temp 98.1 F (36.7 C) (Oral)   Resp 19   Ht 5'  4" (1.626 m)   Wt 80.6 kg   SpO2 96%   BMI 30.49 kg/m  Physical Exam Vitals and nursing note reviewed.  Constitutional:      General: She is not in acute distress.    Appearance: She is well-developed.  HENT:     Head: Normocephalic and atraumatic.  Eyes:     Conjunctiva/sclera: Conjunctivae normal.  Cardiovascular:     Rate and Rhythm: Normal rate and regular rhythm.  Pulmonary:     Effort: Pulmonary effort is normal. No respiratory distress.     Breath sounds: Examination of the right-upper field reveals decreased breath sounds. Examination of the right-middle field reveals  decreased breath sounds. Examination of the right-lower field reveals decreased breath sounds. Decreased breath sounds present.  Abdominal:     Palpations: Abdomen is soft.     Tenderness: There is no abdominal tenderness.  Musculoskeletal:        General: No swelling.     Cervical back: Neck supple.     Right lower leg: Edema present.     Left lower leg: No edema.     Comments: Asymmetric 1+ right lower extremity edema  Skin:    General: Skin is warm and dry.     Capillary Refill: Capillary refill takes less than 2 seconds.  Neurological:     Mental Status: She is alert.  Psychiatric:        Mood and Affect: Mood normal.     ED Results / Procedures / Treatments   Labs (all labs ordered are listed, but only abnormal results are displayed) Labs Reviewed  COMPREHENSIVE METABOLIC PANEL - Abnormal; Notable for the following components:      Result Value   Glucose, Bld 139 (*)    All other components within normal limits  CBC WITH DIFFERENTIAL/PLATELET - Abnormal; Notable for the following components:   RBC 3.80 (*)    Hemoglobin 11.7 (*)    HCT 35.9 (*)    RDW 16.0 (*)    Platelets 148 (*)    All other components within normal limits  TROPONIN I (HIGH SENSITIVITY) - Abnormal; Notable for the following components:   Troponin I (High Sensitivity) 22 (*)    All other components within normal limits  TROPONIN I (HIGH SENSITIVITY) - Abnormal; Notable for the following components:   Troponin I (High Sensitivity) 23 (*)    All other components within normal limits  RESP PANEL BY RT-PCR (FLU A&B, COVID) [id number]  BRAIN NATRIURETIC PEPTIDE  HEPARIN LEVEL (UNFRACTIONATED)    EKG EKG Interpretation  Date/Time:  Monday December 25 2021 11:30:31 EDT Ventricular Rate:  87 PR Interval:  162 QRS Duration: 81 QT Interval:  370 QTC Calculation: 443 R Axis:   -3 Text Interpretation: Sinus rhythm Atrial premature complex Consider left ventricular hypertrophy Confirmed by Regan Lemming  ([id number]) on 12/25/2021 11:35:13 AM  Radiology CT Angio Chest PE W and/or Wo Contrast  Result Date: 12/25/2021 CLINICAL DATA:  Pancreatic carcinoma, shortness of breath, weakness EXAM: CT ANGIOGRAPHY CHEST WITH CONTRAST TECHNIQUE: Multidetector CT imaging of the chest was performed using the standard protocol during bolus administration of intravenous contrast. Multiplanar CT image reconstructions and MIPs were obtained to evaluate the vascular anatomy. RADIATION DOSE REDUCTION: This exam was performed according to the departmental dose-optimization program which includes automated exposure control, adjustment of the mA and/or kV according to patient size and/or use of iterative reconstruction technique. CONTRAST:  64m OMNIPAQUE IOHEXOL 350 MG/ML SOLN COMPARISON:  09/06/2021 FINDINGS: Cardiovascular: Right  IJ port catheter to the distal SVC. Heart size normal. No pericardial effusion. Abnormally increased RV/LV ratio 1.43. Good contrast opacification of pulmonary arterial tree. There are central near occlusive central emboli in right upper lobe pulmonary artery extending into segmental branches distally. On the left there is a saddle embolus extending into left upper lobe, lingular, and lower lobe segmental branches. Moderate coronary calcifications. Good contrast opacification of the thoracic aorta without aneurysm, dissection, or stenosis. Scattered calcified atheromatous plaque in the arch and proximal descending thoracic aorta which is nondilated. Mediastinum/Nodes: Small hiatal hernia. No mediastinal hematoma, mass or adenopathy. Lungs/Pleura: Some interval increase in posterior right pleural effusion, with mixed-attenuation loculated component in the major fissure also increased in size. No left effusion. 4 mm central left upper lobe nodule previously 5 mm. 1 cm right middle lobe nodule (Im104,Se6, previously 7 mm. More inferiorly 1.3 cm perifissural nodule, previously 1.1 cm.) Upper Abdomen: 4.1 cm  infiltrative pancreatic tail lesion grossly unchanged. Stable subcentimeter low-attenuation nonspecific liver lesions. Musculoskeletal: Anterior vertebral endplate spurring at multiple levels in the mid and lower thoracic spine. Bilateral shoulder DJD. Review of the MIP images confirms the above findings. IMPRESSION: 1. Positive for bilateral pulmonary emboli with CT evidence of right heart strain (RV/LV Ratio = 1.43) consistent with at least submassive (intermediate risk) PE. The presence of right heart strain has been associated with an increased risk of morbidity and mortality. Please refer to the "Code PE Focused" order set in EPIC. Critical Value/emergent results were called by telephone at the time of interpretation on 12/25/2021 at 3:36 pm to provider [facility] , who verbally acknowledged these results. 2. Slight increase in right pleural effusion. 3. Stable pancreatic tail mass with marginal progression of pulmonary nodules. 4. Coronary and Aortic Atherosclerosis (ICD10-170.0). Electronically Signed   By: Lucrezia Europe M.D.   On: 12/25/2021 15:36   VAS [place] LOWER EXTREMITY VENOUS (DVT) (ONLY [facility] & [facility])  Result Date: 12/25/2021  Lower Venous DVT Study Patient Name:  LADON HENEY [facility]  Date of Exam:   12/25/2021 Medical Rec #: [id number]            Accession #:    [id number] Date of Birth: June 30, 1935           Patient Gender: F Patient Age:   31 years Exam Location:  [facility] Procedure:      VAS [place] LOWER EXTREMITY VENOUS (DVT) Referring Phys: Regan Lemming --------------------------------------------------------------------------------  Indications: Swelling, SOB, and Right lower extremity swelling.  Risk Factors: Cancer Pancreatic. 08/30/2021 CT showed DVT (Central filling defect within an enlarged left femoral vein) no follow up ultrasound ordered. Comparison Study: No prior study on file Performing Technologist: Sharion Dove RVS  Examination Guidelines: A complete evaluation includes B-mode  imaging, spectral Doppler, color Doppler, and power Doppler as needed of all accessible portions of each vessel. Bilateral testing is considered an integral part of a complete examination. Limited examinations for reoccurring indications may be performed as noted. The reflux portion of the exam is performed with the patient in reverse Trendelenburg.  +----------+---------------+---------+-----------+----------+-----------------+ RIGHT     CompressibilityPhasicitySpontaneityPropertiesThrombus Aging    +----------+---------------+---------+-----------+----------+-----------------+ CFV       Full           Yes      No                   Acute, proximal  portion           +----------+---------------+---------+-----------+----------+-----------------+ SFJ       Full                                                           +----------+---------------+---------+-----------+----------+-----------------+ FV Prox   Partial        Yes      Yes                  Acute             +----------+---------------+---------+-----------+----------+-----------------+ FV Mid    None           No       No                   Acute             +----------+---------------+---------+-----------+----------+-----------------+ FV Distal None           No       No                   Acute             +----------+---------------+---------+-----------+----------+-----------------+ PFV       Partial        Yes      No                   Acute             +----------+---------------+---------+-----------+----------+-----------------+ POP       None           No       No                   Acute             +----------+---------------+---------+-----------+----------+-----------------+ PTV       None           No       No                   Acute              +----------+---------------+---------+-----------+----------+-----------------+ PERO      None                                         Acute             +----------+---------------+---------+-----------+----------+-----------------+ Gastroc   None           No       No                   Acute             +----------+---------------+---------+-----------+----------+-----------------+ Distal CIV               Yes      Yes                  patent            +----------+---------------+---------+-----------+----------+-----------------+   +----------+---------------+---------+-----------+----------+-----------------+ LEFT      CompressibilityPhasicitySpontaneityPropertiesThrombus Aging    +----------+---------------+---------+-----------+----------+-----------------+ CFV       Partial  Yes      Yes                  Age Indeterminate +----------+---------------+---------+-----------+----------+-----------------+ SFJ       Full                                                           +----------+---------------+---------+-----------+----------+-----------------+ FV Prox   Partial        Yes      Yes                  Age Indeterminate +----------+---------------+---------+-----------+----------+-----------------+ FV Mid    Partial        No       No                   Age Indeterminate +----------+---------------+---------+-----------+----------+-----------------+ FV Distal Partial        No       No                   Age Indeterminate +----------+---------------+---------+-----------+----------+-----------------+ PFV       Partial        Yes      Yes                  Age Indeterminate +----------+---------------+---------+-----------+----------+-----------------+ POP       Partial        No       No                   Age Indeterminate +----------+---------------+---------+-----------+----------+-----------------+ PTV       None            No       No                   Age Indeterminate +----------+---------------+---------+-----------+----------+-----------------+ PERO      None                                         Age Indeterminate +----------+---------------+---------+-----------+----------+-----------------+ Gastroc   Full                                                           +----------+---------------+---------+-----------+----------+-----------------+ EIV                      Yes      Yes                  patent            +----------+---------------+---------+-----------+----------+-----------------+ Distal CIV               Yes      Yes                  patent            +----------+---------------+---------+-----------+----------+-----------------+     Summary: RIGHT: - Findings consistent with acute deep vein thrombosis involving the right common femoral vein, right femoral vein, right proximal profunda vein,  right popliteal vein, right posterior tibial veins, right peroneal veins, and right gastrocnemius veins. no obvious propagation to the distal common iliac vein  LEFT: - Findings consistent with age indeterminate deep vein thrombosis involving the left common femoral vein, left femoral vein, left proximal profunda vein, left posterior tibial veins, left popliteal vein, and left peroneal veins. No obvious propagation to the EIV or to the distal common iliac veins.  *See table(s) above for measurements and observations.    Preliminary    DG Chest Port 1 View  Result Date: 12/25/2021 CLINICAL DATA:  An 86 year old female present as with shortness of breath with history of pancreatic cancer. EXAM: PORTABLE CHEST 1 VIEW COMPARISON:  September 06, 2021. FINDINGS: RIGHT-sided Port-A-Cath in place, tip in the area of the distal superior vena cava or caval to atrial junction. EKG leads project over the patient's chest. Cardiomediastinal contours and hilar structures are stable. Increasing opacity in the  RIGHT lower chest in this patient with known nodules in this area and fissural thickening. Suspect small RIGHT-sided pleural effusion as well. No lobar level consolidative process. No pneumothorax. On limited assessment there is no acute skeletal finding with marked glenohumeral degenerative changes RIGHT greater than LEFT. IMPRESSION: Increasing opacity in the RIGHT lower chest in this patient with known nodules in this area and fissural thickening. Suspect small RIGHT-sided pleural effusion as well. This may reflect worsening of disease in the chest or superimposed infection or inflammation. Electronically Signed   By: Zetta Bills M.D.   On: 12/25/2021 12:41    Procedures .Critical Care  Performed by: Regan Lemming, MD Authorized by: Regan Lemming, MD   Critical care provider statement:    Critical care time (minutes):  30   Critical care was time spent personally by me on the following activities:  Development of treatment plan with patient or surrogate, discussions with consultants, evaluation of patient's response to treatment, examination of patient, ordering and review of laboratory studies, ordering and review of radiographic studies, ordering and performing treatments and interventions, pulse oximetry, re-evaluation of patient's condition and review of old charts   Care discussed with: admitting provider       Medications Ordered in ED Medications  heparin ADULT infusion 100 units/mL (25000 units/242m) (1,050 Units/hr Intravenous New Bag/Given 12/25/21 1358)  sodium chloride (PF) 0.9 % injection (has no administration in time range)  heparin bolus via infusion 2,000 Units (2,000 Units Intravenous Bolus from Bag 12/25/21 1358)  iohexol (OMNIPAQUE) 350 MG/ML injection 75 mL (69 mLs Intravenous Contrast Given 12/25/21 1500)    ED Course/ Medical Decision Making/ A&P Clinical Course as of 12/25/21 1620  Mon Dec 25, 2021  1438 Troponin I (High Sensitivity)(!): 22 [JL]    Clinical  Course User Index [JL] LRegan Lemming MD                           Medical Decision Making Amount and/or Complexity of Data Reviewed Labs: ordered. Decision-making details documented in ED Course. Radiology: ordered.  Risk Prescription drug management. Decision regarding hospitalization.   86year old female with medical history significant for pancreatic cancer on chemotherapy who presents to the emergency department from her oncologist office due to concern for possible PE.  The patient states that she developed sudden onset shortness of breath this morning. She endorses generalized fatigue and malaise. She denies any lower extremity swelling but on checking in the exam room today she appears to have new right lower extremity swelling  compared to left.  She endorses shortness of breath without chest pain.  She denies any cough, fever or chills.  At her oncologist office she was found to be saturating between 86 to 90% on room air and this was subsequently placed on oxygen and sent to the emergency department for further evaluation.    On arrival, the patient was afebrile, tachypneic RR 28, hypoxic 84% on room air, subsequently replaced back on oxygen to 4 L O2 via nasal cannula.  Patient was not tachycardic, normotensive on arrival.  Sinus rhythm noted on cardiac telemetry.  Physical exam significant for right lower extremity 1+ edema, decreased breath sounds on the right, no rales or rhonchi, no wheezing.  Patient has a history of pancreatic cancer presents with concern for hypoxic respiratory failure in the setting of possible PE.  Additionally considered pneumonia or pneumothorax or ACS.  Concern for possible DVT in the right lower extremity and ultrasound was ordered.  Will obtain CTA imaging to further evaluate in addition to screening labs.  EKG was performed, revealed sinus rhythm, ventricular rate 87, no acute ischemic changes noted.   Laboratory evaluation significant for troponins  22 and 23, COVID-19 influenza PCR testing negative, CMP without significant electrolyte abnormality, CBC with anemia at 11.7, no leukocytosis, BNP normal.  Her DVT ultrasound was also positive for acute DVT in the right lower extremity for which she was started on heparin after consultation with pharmacy.  The patient's CTA PE study was positive for bilateral PEs.  With the patient's elevated troponin, hypoxic respiratory failure, concern for right heart strain and need for continued inpatient monitoring for oxygen supplementation in the setting of her PEs.    I consulted pulmonary critical care, Dr. Bjorn Loser who will see the patient in consultation.  The patient is currently stable on 4 L O2, not tachycardic or tachypneic, normotensive.  I believe that she is currently stable for floor admission.  I spoke with Dr.'s Francesco Sor of hospitalist medicine who accepted the patient in admission.    Final Clinical Impression(s) / ED Diagnoses Final diagnoses:  Acute pulmonary embolism with acute cor pulmonale, unspecified pulmonary embolism type ([place])  Acute deep vein thrombosis (DVT) of proximal vein of right lower extremity [facility])    Rx / DC Orders ED Discharge Orders     None         Regan Lemming, MD 12/25/21 1620    Regan Lemming, MD 01/22/22 1418

## 2021-12-25 NOTE — Plan of Care (Signed)
Pulm said to hold antihypertensives so will hold and can add back on later OSA, so will add cpap at night.

## 2021-12-25 NOTE — Progress Notes (Signed)
PT set up on cpap for the night.

## 2021-12-25 NOTE — Consult Note (Signed)
NAME:  Kathryn Kent, MRN:  [id number], DOB:  [birth year], LOS: 0 ADMISSION DATE:  12/25/2021, CONSULTATION DATE:  12/25/21 REFERRING MD:  Armandina Gemma, CHIEF COMPLAINT:  dyspnea   History of Present Illness:  86yF with history of metastatic pancreatic cancer, CAD, OSA on CPAP, HTN who presented from oncology clinic due to concern for PE. She says she developed acute onset dyspnea morning of admission  In ED she was found to have submassive PE (trop 23, LV/RV ratio 1.43) small-moderate R pleural effusion, extensive proximal R common femoral DVT, indeterminate age left common femoral DVT. She was started on heparin gtt, 4L [place], PCCM consulted  Pertinent  Medical History  Metastatic pancreatic cancer CAD OSA on CPAP HTN  Significant Hospital Events: Including procedures, antibiotic start and stop dates in addition to other pertinent events   12/25/21 admitted and started on heparin gtt  Interim History / Subjective:    Objective   Blood pressure 136/80, pulse 86, temperature 98.1 F (36.7 C), temperature source Oral, resp. rate 19, height '5\' 4"'$  (1.626 m), weight 80.6 kg, SpO2 96 %.       No intake or output data in the 24 hours ending 12/25/21 1616 Filed Weights   12/25/21 1124  Weight: 80.6 kg    Examination: General appearance: 86 y.o., female, NAD, conversant  Eyes: anicteric sclerae; PERRL, tracking appropriately HENT: NCAT; MMM Neck: Trachea midline; no lymphadenopathy, no JVD Lungs: CTAB, no crackles, no wheeze, with normal respiratory effort CV: RRR, no murmur  Abdomen: Soft, non-tender; non-distended, BS present  Extremities: R>L LE edema, warm Skin: Normal turgor and texture; no rash Psych: Appropriate affect Neuro: Alert and oriented to person and place, no focal deficit    Resolved Hospital Problem list     Assessment & Plan:   # Submassive PE, high risk by SPESI score # right acute and left indeterminate proximal DVT without phlegmasia - TTE is ordered -  agree with heparin gtt - bedrest today/tonight - would hold antihypertensives for now  # Right pleural effusion - don't want to interrupt anticoagulation this early in her course if at all possible for procedure, would only entertain thoracentesis inpatient if enlarging and clearly worsening her dyspnea  # OSA on CPAP - CPAP at night and with naps  Best Practice (right click and "Reselect all SmartList Selections" daily)   Per [facility]  Labs   CBC: Recent Labs  Lab 12/25/21 1001 12/25/21 1134  WBC 5.9 6.1  NEUTROABS 4.2 4.4  HGB 11.8* 11.7*  HCT 35.5* 35.9*  MCV 90.6 94.5  PLT 152 148*    Basic Metabolic Panel: Recent Labs  Lab 12/25/21 1001 12/25/21 1134  NA 138 139  K 3.6 3.7  CL 103 105  CO2 28 27  GLUCOSE 150* 139*  BUN 21 23  CREATININE 0.61 0.50  CALCIUM 10.0 9.6   GFR: Estimated Creatinine Clearance: 54.8 mL/min (by C-G formula based on SCr of 0.5 mg/dL). Recent Labs  Lab 12/25/21 1001 12/25/21 1134  WBC 5.9 6.1    Liver Function Tests: Recent Labs  Lab 12/25/21 1001 12/25/21 1134  AST 17 18  ALT 22 25  ALKPHOS 85 76  BILITOT 0.7 0.8  PROT 7.8 7.5  ALBUMIN 3.7 3.5   No results for input(s): "LIPASE", "AMYLASE" in the last 168 hours. No results for input(s): "AMMONIA" in the last 168 hours.  ABG    Component Value Date/Time   TCO2 28 01/03/2017 1143     Coagulation Profile: No  results for input(s): "INR", "PROTIME" in the last 168 hours.  Cardiac Enzymes: No results for input(s): "CKTOTAL", "CKMB", "CKMBINDEX", "TROPONINI" in the last 168 hours.  HbA1C: No results found for: "HGBA1C"  CBG: No results for input(s): "GLUCAP" in the last 168 hours.  Review of Systems:   12 point review of systems is negative except as in HPI  Past Medical History:  She,  has a past medical history of Anemia, Coronary artery disease (cardiologist---dr berry), Cyst of left kidney, Diverticulosis, Heart murmur, History of colon polyps, History of  kidney stones, Hyperlipidemia, Hypertension, Leiomyoma of uterus, Liver cyst, OSA on CPAP, Osteoarthritis, pancreatic ca (93/2671), Periumbilical hernia, PMB (postmenopausal bleeding), Vulvar cyst, and Wears dentures.   Surgical History:   Past Surgical History:  Procedure Laterality Date   APPENDECTOMY  age 68   and removal cyst   CARDIAC CATHETERIZATION  10-16-2005   dr berry   nonobstructive cad w/ mid segmental LAD 30-40% ,  normal LVF   COLONOSCOPY  06/24/2017   CYSTOSCOPY W/ URETERAL STENT PLACEMENT Right 01/03/2017   Procedure: CYSTOSCOPY WITH RETROGRADE PYELOGRAM/ URETEROSCOPY/ STONE BASKETRY/URETERAL STENT PLACEMENT;  Surgeon: Cleon Gustin, MD;  Location: [facility];  Service: Urology;  Laterality: Right;   DILATATION & CURETTAGE/HYSTEROSCOPY WITH MYOSURE N/A 12/30/2018   Procedure: [place];  Surgeon: Megan Salon, MD;  Location: [facility];  Service: Gynecology;  Laterality: N/A;   FOOT ARTHRODESIS Bilateral 1987   great toe's   great toe surgery Bilateral    HOLMIUM LASER APPLICATION Right 2/45/8099   Procedure: HOLMIUM LASER APPLICATION;  Surgeon: Cleon Gustin, MD;  Location: [facility];  Service: Urology;  Laterality: Right;   IR IMAGING GUIDED PORT INSERTION  09/22/2021   TOTAL KNEE ARTHROPLASTY Right 01/20/2018   Procedure: RIGHT TOTAL KNEE ARTHROPLASTY;  Surgeon: Gaynelle Arabian, MD;  Location: [facility];  Service: Orthopedics;  Laterality: Right;  with block     Social History:   reports that she has never smoked. She has never used smokeless tobacco. She reports that she does not currently use alcohol. She reports that she does not use drugs.   Family History:  Her family history includes Alcohol abuse in her mother; Aneurysm in her brother; Breast cancer (age of onset: 35) in her sister; Colon cancer in an other family member; Leukemia in her maternal aunt; Prostate cancer  in her father; Stroke in her sister; Uterine cancer in her sister.   Allergies Allergies  Allergen Reactions   Lipitor [Atorvastatin]     Stomach pains, heart attack sx's     Home Medications  Prior to Admission medications   Medication Sig Start Date End Date Taking? Authorizing Provider  amLODipine (NORVASC) 10 MG tablet Take 10 mg by mouth daily.    Yes [provider]  chlorthalidone (HYGROTON) 25 MG tablet Take 0.5 tablets by mouth daily.  03/12/16  Yes [provider]  dicyclomine (BENTYL) 20 MG tablet Take 1 tablet (20 mg total) by mouth 2 (two) times daily. 10/18/21  Yes Harris, Abigail, PA-C  ezetimibe (ZETIA) 10 MG tablet Take 10 mg by mouth daily. 10/17/20  Yes [provider]  irbesartan (AVAPRO) 300 MG tablet Take 1 tablet by mouth daily.  10/16/17  Yes [provider]  lidocaine-prilocaine (EMLA) cream Apply to affected area once Patient taking differently: 1 Application daily as needed (access port). 09/15/21  Yes Truitt Merle, MD  montelukast (SINGULAIR) 10 MG tablet Take  1 tablet by mouth at bedtime. 12/09/18  Yes [provider]  Multiple Vitamin (MULTIVITAMIN) tablet Take 1 tablet by mouth daily.   Yes [provider]  ondansetron (ZOFRAN) 8 MG tablet Take 1 tablet (8 mg total) by mouth 2 (two) times daily as needed (Nausea or vomiting). 12/12/21  Yes Truitt Merle, MD  potassium chloride (KLOR-CON M) 10 MEQ tablet Take 1 tablet (10 mEq total) by mouth daily. 12/12/21  Yes Truitt Merle, MD  prochlorperazine (COMPAZINE) 10 MG tablet Take 1 tablet (10 mg total) by mouth every 6 (six) hours as needed (Nausea or vomiting). 12/12/21  Yes Truitt Merle, MD  simvastatin (ZOCOR) 40 MG tablet Take 20 mg by mouth daily.   Yes [provider]  ibuprofen (ADVIL) 600 MG tablet Take 1 tablet (600 mg total) by mouth every 8 (eight) hours as needed. Patient not taking: Reported on 12/25/2021 12/30/18   Megan Salon, MD  traMADol (ULTRAM) 50 MG  tablet Take 1 tablet (50 mg total) by mouth every 6 (six) hours as needed. Patient taking differently: Take 50 mg by mouth every 6 (six) hours as needed for moderate pain. 10/02/21   Truitt Merle, MD     Critical care time: n/a

## 2021-12-25 NOTE — Progress Notes (Signed)
[place] for IV heparin  Indication: DVT  Allergies  Allergen Reactions   Lipitor [Atorvastatin]     Stomach pains, heart attack sx's    Patient Measurements: Height: '5\' 4"'$  (162.6 cm) Weight: 80.6 kg (177 lb 9.6 oz) IBW/kg (Calculated) : 54.7 Heparin Dosing Weight: 72 kg  Vital Signs: Temp: 98.7 F (37.1 C) (07/03 2233) Temp Source: Oral (07/03 2233) BP: 130/83 (07/03 2233) Pulse Rate: 82 (07/03 2233)  Labs: Recent Labs    12/25/21 1001 12/25/21 1134 12/25/21 1335 12/25/21 2200  HGB 11.8* 11.7*  --   --   HCT 35.5* 35.9*  --   --   PLT 152 148*  --   --   HEPARINUNFRC  --   --   --  0.53  CREATININE 0.61 0.50  --   --   TROPONINIHS  --  22* 23*  --      Estimated Creatinine Clearance: 54.8 mL/min (by C-G formula based on SCr of 0.5 mg/dL).   Medical History: Past Medical History:  Diagnosis Date   Anemia    Coronary artery disease cardiologist---dr berry   cardiac cath --   10/16/05 revealing noncritical CAD   Cyst of left kidney    small   Diverticulosis    Heart murmur    History of colon polyps    History of kidney stones    Hyperlipidemia    Hypertension    Leiomyoma of uterus    Liver cyst    small   OSA on CPAP    Osteoarthritis    pancreatic ca 85/8850   Periumbilical hernia    Moderate   PMB (postmenopausal bleeding)    Vulvar cyst    Wears dentures    upper    Medications:  Scheduled:  Infusions:   Assessment: 86 yo female presented from cancer center with hypoxia and tachycardia to rule out PE/DVT. To start IV heparin.  Baseline CBC: Hg 11.7; pltc 148k- slightly low. Patient not on any anticoagulation prior to [facility]  12/25/2021: Initial heparin level 0.53- therapeutic on IV heparin 1050 units/hr No bleeding or infusion related issues reported by RN  Goal of Therapy:  Heparin level 0.3-0.7 units/ml Monitor platelets by anticoagulation protocol: Yes   Plan:  Continue IV heparin  infusion at 1050 units/hr Check confirmatory heparin level with morning labs  Daily CBC & heparin level while on heparin  Netta Cedars PharmD 12/25/2021,11:31 PM

## 2021-12-25 NOTE — Progress Notes (Addendum)
VASCULAR LAB    Bilateral lower extremity venous duplex has been performed.  See CV proc for preliminary results.  Called report to Dr. [place]ble [place], [employer], RVT 12/25/2021, 1:15 PM

## 2021-12-25 NOTE — H&P (Signed)
History and Physical    Elzada Pytel KZS:[id number] DOB: 1935/07/19 DOA: 12/25/2021  PCP: Crist Infante, MD   Patient coming from: home Chief Complaint: concern for PE by oncology  HPI: Kathryn Kent is a 86 y.o. female with a pertinent history of pancreatic cancer on chemotherapy who presents to the emergency department from her oncologist office due to concern for possible PE.  The patient states that she developed sudden onset shortness of breath this morning and had appt with oncology providers.  Myra, the niece, picked patient up and was in the garden a little bit this morning.  PT likes to maintain her independence but has been tough with recent cancer diagnosis.  Suddenyl she developed sob and already had an appt with oncology today who was concerned about PE or other.  She was not able to catch her breath and had difficulty walking. Denies h/o MI.  Denies CP, hemoptysis, palpitations.  Was noticed to be desatting to 84%, hypoxic, respiratory rate 28, afebrile and placed on 4 L oxygen satting 96-98%.  CTA showed a PE and was started on heparin drip.  WBC 6.1, Hgb 11.7, PLT 148, NA 139, K3.7, glucose 139, CO2 27, SCR 0.5, LFTs okay, BNP is 19.4 and troponin is mildly elevated to 22 and then 23.  COVID and flu are negative - EKG showed sinus rhythm without specific ischemic changes - Ultrasound of the lower extremity showed an acute DVT of especially of the right common femoral vein.  On the left has an age-indeterminate DVT Cta PE showed Abnormally increased RV/LV ratio 1.43. Good contrast opacification of pulmonary arterial tree. There are central near occlusive central emboli in right upper lobe pulmonary artery extending into segmental branches distally. On the left there is a saddle embolus extending into left upper lobe, lingular, and lower lobe segmental branches. R pleural effusion, stable pancreatic tail mass with marginal progression of pulm nodules.  In room is  sitting comfortably in bed on 4L of oxygen satting pretty well so feel comfortable for the floor.   Review of Systems: As per HPI otherwise 10 point review of systems negative.  Other pertinents as below:  General -denies any recent illness, denies any fevers or chills HEENT -denies any new headache, visual changes, does state that she has a history of nosebleeding Cardio -as per HPI Resp -denies any cough or productive cough GI -denies any nausea, vomiting, diarrhea, current hematochezia or melena GU -denies any vaginal bleeding although she has a history, the last of which was back in March MSK -denies new joint or complaints Skin -denies any new skin lesions or rashes Neuro -denies any new numbness or weakness Psych -denies any new anxiety or depression   Past Medical History:  Diagnosis Date   Anemia    Coronary artery disease cardiologist---dr berry   cardiac cath --   10/16/05 revealing noncritical CAD   Cyst of left kidney    small   Diverticulosis    Heart murmur    History of colon polyps    History of kidney stones    Hyperlipidemia    Hypertension    Leiomyoma of uterus    Liver cyst    small   OSA on CPAP    Osteoarthritis    pancreatic ca 73/2202   Periumbilical hernia    Moderate   PMB (postmenopausal bleeding)    Vulvar cyst    Wears dentures    upper    Past Surgical History:  Procedure Laterality Date   APPENDECTOMY  age 89   and removal cyst   CARDIAC CATHETERIZATION  10-16-2005   dr berry   nonobstructive cad w/ mid segmental LAD 30-40% ,  normal LVF   COLONOSCOPY  06/24/2017   CYSTOSCOPY W/ URETERAL STENT PLACEMENT Right 01/03/2017   Procedure: CYSTOSCOPY WITH RETROGRADE PYELOGRAM/ URETEROSCOPY/ STONE BASKETRY/URETERAL STENT PLACEMENT;  Surgeon: Cleon Gustin, MD;  Location: [facility];  Service: Urology;  Laterality: Right;   DILATATION & CURETTAGE/HYSTEROSCOPY WITH MYOSURE N/A 12/30/2018   Procedure: [place];  Surgeon: Megan Salon, MD;  Location: [facility];  Service: Gynecology;  Laterality: N/A;   FOOT ARTHRODESIS Bilateral 1987   great toe's   great toe surgery Bilateral    HOLMIUM LASER APPLICATION Right 10/08/6061   Procedure: HOLMIUM LASER APPLICATION;  Surgeon: Cleon Gustin, MD;  Location: [facility];  Service: Urology;  Laterality: Right;   IR IMAGING GUIDED PORT INSERTION  09/22/2021   TOTAL KNEE ARTHROPLASTY Right 01/20/2018   Procedure: RIGHT TOTAL KNEE ARTHROPLASTY;  Surgeon: Gaynelle Arabian, MD;  Location: [facility];  Service: Orthopedics;  Laterality: Right;  with block     reports that she has never smoked. She has never used smokeless tobacco. She reports that she does not currently use alcohol. She reports that she does not use drugs.  Allergies  Allergen Reactions   Lipitor [Atorvastatin]     Stomach pains, heart attack sx's    Family History  Problem Relation Age of Onset   Alcohol abuse Mother    Prostate cancer Father        vs stomach cancer   Stroke Sister    Breast cancer Sister 43   Uterine cancer Sister        dx 9s   Aneurysm Brother        heart   Leukemia Maternal Aunt    Colon cancer Other        dx 24s   Prior to Admission medications   Medication Sig Start Date End Date Taking? Authorizing Provider  amLODipine (NORVASC) 10 MG tablet Take 10 mg by mouth daily.    Yes [provider]  chlorthalidone (HYGROTON) 25 MG tablet Take 0.5 tablets by mouth daily.  03/12/16  Yes [provider]  dicyclomine (BENTYL) 20 MG tablet Take 1 tablet (20 mg total) by mouth 2 (two) times daily. 10/18/21  Yes Harris, Abigail, PA-C  ezetimibe (ZETIA) 10 MG tablet Take 10 mg by mouth daily. 10/17/20  Yes [provider]  irbesartan (AVAPRO) 300 MG tablet Take 1 tablet by mouth daily.  10/16/17  Yes [provider]  lidocaine-prilocaine (EMLA) cream Apply to affected  area once Patient taking differently: 1 Application daily as needed (access port). 09/15/21  Yes Truitt Merle, MD  montelukast (SINGULAIR) 10 MG tablet Take 1 tablet by mouth at bedtime. 12/09/18  Yes [provider]  Multiple Vitamin (MULTIVITAMIN) tablet Take 1 tablet by mouth daily.   Yes [provider]  ondansetron (ZOFRAN) 8 MG tablet Take 1 tablet (8 mg total) by mouth 2 (two) times daily as needed (Nausea or vomiting). 12/12/21  Yes Truitt Merle, MD  potassium chloride (KLOR-CON M) 10 MEQ tablet Take 1 tablet (10 mEq total) by mouth daily. 12/12/21  Yes Truitt Merle, MD  prochlorperazine (COMPAZINE) 10 MG tablet Take 1 tablet (10 mg total) by mouth every 6 (six) hours as needed (Nausea or vomiting). 12/12/21  Yes Truitt Merle, MD  simvastatin (ZOCOR) 40 MG tablet Take 20 mg by mouth daily.   Yes [provider]  ibuprofen (ADVIL) 600 MG tablet Take 1 tablet (600 mg total) by mouth every 8 (eight) hours as needed. Patient not taking: Reported on 12/25/2021 12/30/18   Megan Salon, MD  traMADol (ULTRAM) 50 MG tablet Take 1 tablet (50 mg total) by mouth every 6 (six) hours as needed. Patient taking differently: Take 50 mg by mouth every 6 (six) hours as needed for moderate pain. 10/02/21   Truitt Merle, MD    Physical Exam: Vitals:   12/25/21 1445 12/25/21 1530 12/25/21 1615 12/25/21 1645  BP: 129/88 136/80 139/82 (!) 140/91  Pulse: 92 86 98 96  Resp: '19 19 20 20  '$ Temp:      TempSrc:      SpO2: 97% 96% 98% 99%  Weight:      Height:        Constitutional: NAD, comfortable very pleasant, nontoxic-appearing Eyes: pupils equal and reactive to light, anicteric, without injection ENMT: MMM, throat without exudates or erythema Neck: normal, supple, no masses, no thyromegaly noted Respiratory: CTAB, nwob, with oxygen on, no cough noted, Cardiovascular: rrr w/o mrg, warm extremities Abdomen: NBS, NT,   Musculoskeletal: moving all 4 extremities, strength grossly intact 5/5 in the UE  and LE's,  Skin: no rashes, lesions, ulcers. No induration, patient does show me but will that does not look infected on the right groin area Neurologic: CN 2-12 grossly intact. Sensation intact Psychiatric: AO appearing, mentation appropriate  Labs on Admission: I have personally reviewed following labs and imaging studies  CBC: Recent Labs  Lab 12/25/21 1001 12/25/21 1134  WBC 5.9 6.1  NEUTROABS 4.2 4.4  HGB 11.8* 11.7*  HCT 35.5* 35.9*  MCV 90.6 94.5  PLT 152 630*   Basic Metabolic Panel: Recent Labs  Lab 12/25/21 1001 12/25/21 1134  NA 138 139  K 3.6 3.7  CL 103 105  CO2 28 27  GLUCOSE 150* 139*  BUN 21 23  CREATININE 0.61 0.50  CALCIUM 10.0 9.6   GFR: Estimated Creatinine Clearance: 54.8 mL/min (by C-G formula based on SCr of 0.5 mg/dL). Liver Function Tests: Recent Labs  Lab 12/25/21 1001 12/25/21 1134  AST 17 18  ALT 22 25  ALKPHOS 85 76  BILITOT 0.7 0.8  PROT 7.8 7.5  ALBUMIN 3.7 3.5   No results for input(s): "LIPASE", "AMYLASE" in the last 168 hours. No results for input(s): "AMMONIA" in the last 168 hours. Coagulation Profile: No results for input(s): "INR", "PROTIME" in the last 168 hours. Cardiac Enzymes: No results for input(s): "CKTOTAL", "CKMB", "CKMBINDEX", "TROPONINI" in the last 168 hours. BNP (last 3 results) No results for input(s): "PROBNP" in the last 8760 hours. HbA1C: No results for input(s): "HGBA1C" in the last 72 hours. CBG: No results for input(s): "GLUCAP" in the last 168 hours. Lipid Profile: No results for input(s): "CHOL", "HDL", "LDLCALC", "TRIG", "CHOLHDL", "LDLDIRECT" in the last 72 hours. Thyroid Function Tests: No results for input(s): "TSH", "T4TOTAL", "FREET4", "T3FREE", "THYROIDAB" in the last 72 hours. Anemia Panel: No results for input(s): "VITAMINB12", "FOLATE", "FERRITIN", "TIBC", "IRON", "RETICCTPCT" in the last 72 hours. Urine analysis:    Component Value Date/Time   COLORURINE AMBER (A) 10/18/2021 1224    APPEARANCEUR CLEAR 10/18/2021 1224   LABSPEC 1.020 10/18/2021 1224   PHURINE 5.0 10/18/2021 1224   GLUCOSEU NEGATIVE 10/18/2021 1224   HGBUR NEGATIVE 10/18/2021 1224   BILIRUBINUR NEGATIVE  10/18/2021 1224   BILIRUBINUR n 12/02/2018 1216   KETONESUR NEGATIVE 10/18/2021 1224   PROTEINUR 30 (A) 10/18/2021 1224   UROBILINOGEN 0.2 12/02/2018 1216   NITRITE NEGATIVE 10/18/2021 1224   LEUKOCYTESUR SMALL (A) 10/18/2021 1224    Radiological Exams on Admission: VAS [place] LOWER EXTREMITY VENOUS (DVT) (ONLY [facility] & [facility])  Result Date: 12/25/2021  Lower Venous DVT Study Patient Name:  MAHDIYA MOSSBERG [facility]  Date of Exam:   12/25/2021 Medical Rec #: [id number]            Accession #:    [id number] Date of Birth: 10-12-1935           Patient Gender: F Patient Age:   74 years Exam [facility]  [facility] Procedure:      VAS [place] LOWER EXTREMITY VENOUS (DVT) Referring Phys: Regan Lemming --------------------------------------------------------------------------------  Indications: Swelling, SOB, and Right lower extremity swelling.  Risk Factors: Cancer Pancreatic. 08/30/2021 CT showed DVT (Central filling defect within an enlarged left femoral vein) no follow up ultrasound ordered. Comparison Study: No prior study on file Performing Technologist: Sharion Dove RVS  Examination Guidelines: A complete evaluation includes B-mode imaging, spectral Doppler, color Doppler, and power Doppler as needed of all accessible portions of each vessel. Bilateral testing is considered an integral part of a complete examination. Limited examinations for reoccurring indications may be performed as noted. The reflux portion of the exam is performed with the patient in reverse Trendelenburg.  +----------+---------------+---------+-----------+----------+-----------------+ RIGHT     CompressibilityPhasicitySpontaneityPropertiesThrombus Aging    +----------+---------------+---------+-----------+----------+-----------------+ CFV        Full           Yes      No                   Acute, proximal                                                          portion           +----------+---------------+---------+-----------+----------+-----------------+ SFJ       Full                                                           +----------+---------------+---------+-----------+----------+-----------------+ FV Prox   Partial        Yes      Yes                  Acute             +----------+---------------+---------+-----------+----------+-----------------+ FV Mid    None           No       No                   Acute             +----------+---------------+---------+-----------+----------+-----------------+ FV Distal None           No       No                   Acute             +----------+---------------+---------+-----------+----------+-----------------+  PFV       Partial        Yes      No                   Acute             +----------+---------------+---------+-----------+----------+-----------------+ POP       None           No       No                   Acute             +----------+---------------+---------+-----------+----------+-----------------+ PTV       None           No       No                   Acute             +----------+---------------+---------+-----------+----------+-----------------+ PERO      None                                         Acute             +----------+---------------+---------+-----------+----------+-----------------+ Gastroc   None           No       No                   Acute             +----------+---------------+---------+-----------+----------+-----------------+ Distal CIV               Yes      Yes                  patent            +----------+---------------+---------+-----------+----------+-----------------+   +----------+---------------+---------+-----------+----------+-----------------+ LEFT       CompressibilityPhasicitySpontaneityPropertiesThrombus Aging    +----------+---------------+---------+-----------+----------+-----------------+ CFV       Partial        Yes      Yes                  Age Indeterminate +----------+---------------+---------+-----------+----------+-----------------+ SFJ       Full                                                           +----------+---------------+---------+-----------+----------+-----------------+ FV Prox   Partial        Yes      Yes                  Age Indeterminate +----------+---------------+---------+-----------+----------+-----------------+ FV Mid    Partial        No       No                   Age Indeterminate +----------+---------------+---------+-----------+----------+-----------------+ FV Distal Partial        No       No                   Age Indeterminate +----------+---------------+---------+-----------+----------+-----------------+ PFV       Partial        Yes  Yes                  Age Indeterminate +----------+---------------+---------+-----------+----------+-----------------+ POP       Partial        No       No                   Age Indeterminate +----------+---------------+---------+-----------+----------+-----------------+ PTV       None           No       No                   Age Indeterminate +----------+---------------+---------+-----------+----------+-----------------+ PERO      None                                         Age Indeterminate +----------+---------------+---------+-----------+----------+-----------------+ Gastroc   Full                                                           +----------+---------------+---------+-----------+----------+-----------------+ EIV                      Yes      Yes                  patent            +----------+---------------+---------+-----------+----------+-----------------+ Distal CIV               Yes       Yes                  patent            +----------+---------------+---------+-----------+----------+-----------------+     Summary: RIGHT: - Findings consistent with acute deep vein thrombosis involving the right common femoral vein, right femoral vein, right proximal profunda vein, right popliteal vein, right posterior tibial veins, right peroneal veins, and right gastrocnemius veins. no obvious propagation to the distal common iliac vein  LEFT: - Findings consistent with age indeterminate deep vein thrombosis involving the left common femoral vein, left femoral vein, left proximal profunda vein, left posterior tibial veins, left popliteal vein, and left peroneal veins. No obvious propagation to the EIV or to the distal common iliac veins.  *See table(s) above for measurements and observations. Electronically signed by Deitra Mayo MD on 12/25/2021 at 4:27:46 PM.    Final    CT Angio Chest PE W and/or Wo Contrast  Result Date: 12/25/2021 CLINICAL DATA:  Pancreatic carcinoma, shortness of breath, weakness EXAM: CT ANGIOGRAPHY CHEST WITH CONTRAST TECHNIQUE: Multidetector CT imaging of the chest was performed using the standard protocol during bolus administration of intravenous contrast. Multiplanar CT image reconstructions and MIPs were obtained to evaluate the vascular anatomy. RADIATION DOSE REDUCTION: This exam was performed according to the departmental dose-optimization program which includes automated exposure control, adjustment of the mA and/or kV according to patient size and/or use of iterative reconstruction technique. CONTRAST:  12m OMNIPAQUE IOHEXOL 350 MG/ML SOLN COMPARISON:  09/06/2021 FINDINGS: Cardiovascular: Right IJ port catheter to the distal SVC. Heart size normal. No pericardial effusion. Abnormally increased RV/LV ratio 1.43. Good contrast opacification of pulmonary arterial tree. There are central near occlusive central  emboli in right upper lobe pulmonary artery extending into  segmental branches distally. On the left there is a saddle embolus extending into left upper lobe, lingular, and lower lobe segmental branches. Moderate coronary calcifications. Good contrast opacification of the thoracic aorta without aneurysm, dissection, or stenosis. Scattered calcified atheromatous plaque in the arch and proximal descending thoracic aorta which is nondilated. Mediastinum/Nodes: Small hiatal hernia. No mediastinal hematoma, mass or adenopathy. Lungs/Pleura: Some interval increase in posterior right pleural effusion, with mixed-attenuation loculated component in the major fissure also increased in size. No left effusion. 4 mm central left upper lobe nodule previously 5 mm. 1 cm right middle lobe nodule (Im104,Se6, previously 7 mm. More inferiorly 1.3 cm perifissural nodule, previously 1.1 cm.) Upper Abdomen: 4.1 cm infiltrative pancreatic tail lesion grossly unchanged. Stable subcentimeter low-attenuation nonspecific liver lesions. Musculoskeletal: Anterior vertebral endplate spurring at multiple levels in the mid and lower thoracic spine. Bilateral shoulder DJD. Review of the MIP images confirms the above findings. IMPRESSION: 1. Positive for bilateral pulmonary emboli with CT evidence of right heart strain (RV/LV Ratio = 1.43) consistent with at least submassive (intermediate risk) PE. The presence of right heart strain has been associated with an increased risk of morbidity and mortality. Please refer to the "Code PE Focused" order set in EPIC. Critical Value/emergent results were called by telephone at the time of interpretation on 12/25/2021 at 3:36 pm to provider [facility] , who verbally acknowledged these results. 2. Slight increase in right pleural effusion. 3. Stable pancreatic tail mass with marginal progression of pulmonary nodules. 4. Coronary and Aortic Atherosclerosis (ICD10-170.0). Electronically Signed   By: Lucrezia Europe M.D.   On: 12/25/2021 15:36   DG Chest Port 1  View  Result Date: 12/25/2021 CLINICAL DATA:  An 86 year old female present as with shortness of breath with history of pancreatic cancer. EXAM: PORTABLE CHEST 1 VIEW COMPARISON:  September 06, 2021. FINDINGS: RIGHT-sided Port-A-Cath in place, tip in the area of the distal superior vena cava or caval to atrial junction. EKG leads project over the patient's chest. Cardiomediastinal contours and hilar structures are stable. Increasing opacity in the RIGHT lower chest in this patient with known nodules in this area and fissural thickening. Suspect small RIGHT-sided pleural effusion as well. No lobar level consolidative process. No pneumothorax. On limited assessment there is no acute skeletal finding with marked glenohumeral degenerative changes RIGHT greater than LEFT. IMPRESSION: Increasing opacity in the RIGHT lower chest in this patient with known nodules in this area and fissural thickening. Suspect small RIGHT-sided pleural effusion as well. This may reflect worsening of disease in the chest or superimposed infection or inflammation. Electronically Signed   By: Zetta Bills M.D.   On: 12/25/2021 12:41    EKG: Independently reviewed.   Assessment/Plan Principal Problem:   Acute pulmonary embolism [facility])  Patient states that she took some of her medicines this morning at 1 AM  SOB doubt related to right pleural effusion Suspect from Submassive PE associated with cancer, probably life long anticoatulion. critcal care consultation as well to see if has anything else to add --DVT RLE as well so if acute worsening then think about this --TTE --continue heparin gtt --appreciate pulm's thoughts on procedures, I think would need to be trasnfered to [place]. --bed rest until therapeutic.  Then focus on maintaining strength while in hospital, consider up in chair --add on PT/OT when therapeutic, consider switching to lovenox or doac if no interactions and if no procedures  Pancreatic cancer on  chemo -Gemcitabine, started 09/25/21             -Abraxane added with C2 (10/16/21). Currently given q2 weeks   --CAD - little review shows 2007 [facility] Noncritical CAD -OSA on cpap --HTN- tomorrow amlodipine 10 mg, hold for now chlorthalidone 12.5 mg daily, continue irbesartan 300 mg daily - Allergies/asthma-Singulair 10 mg daily - HLD-Zetia 10 mg daily, simvastatin 20 mg twice daily - Pain-tramadol 50 mg every 6 hours as needed - Potassium supplementation in the outpatient  Patient and/or Family completely agreed with the plan, expressed understanding and I answered all questions.  DVT prophylaxis: [id number] Code Status: DNR after an extensive conversation with patient Myra one of the two she would want to help make decisions for her if she was not able to, [place], [place] [phone].  She initially stated full code but given guarded prognosis with cancer and PE now she wanted to be DNR.  She might change her mind but will put DNR Family Communication: Myra at bedside, the niece   Disposition Plan: home, patient lives alone Consults called: Dr. Windle Guard Admission status: observation for now given might transition to oral and be discharged   A total of 76 minutes utilized during this admission.  [facility] [place] [facility]   If 7PM-7AM, please contact night-coverage [email or web address] Password [email or web address]  12/25/2021, 5:08 PM

## 2021-12-25 NOTE — Progress Notes (Signed)
ANTICOAGULATION CONSULT NOTE - Initial Consult  Pharmacy Consult for IV heparin  Indication: DVT  Allergies  Allergen Reactions   Lipitor [Atorvastatin]     Stomach pains, heart attack sx's    Patient Measurements: Height: '5\' 4"'$  (162.6 cm) Weight: 80.6 kg (177 lb 9.6 oz) IBW/kg (Calculated) : 54.7 Heparin Dosing Weight: 72 kg  Vital Signs: Temp: 98.1 F (36.7 C) (07/03 1129) Temp Source: Oral (07/03 1129) BP: 127/85 (07/03 1300) Pulse Rate: 81 (07/03 1300)  Labs: Recent Labs    12/25/21 1001 12/25/21 1134  HGB 11.8* 11.7*  HCT 35.5* 35.9*  PLT 152 148*  CREATININE 0.61 0.50  TROPONINIHS  --  22*    Estimated Creatinine Clearance: 54.8 mL/min (by C-G formula based on SCr of 0.5 mg/dL).   Medical History: Past Medical History:  Diagnosis Date   Anemia    Coronary artery disease cardiologist---dr berry   cardiac cath --   10/16/05 revealing noncritical CAD   Cyst of left kidney    small   Diverticulosis    Heart murmur    History of colon polyps    History of kidney stones    Hyperlipidemia    Hypertension    Leiomyoma of uterus    Liver cyst    small   OSA on CPAP    Osteoarthritis    Periumbilical hernia    Moderate   PMB (postmenopausal bleeding)    Vulvar cyst    Wears dentures    upper    Medications:  Scheduled:  Infusions:   Assessment: 86 yo female presented from cancer center with hypoxia and tachycardia to rule out PE/DVT. To start IV heparin. Baseline CBC and SCr have been drawn. Plts noted to be 148k. Patient not on any anticoagulation prior to admisson  Goal of Therapy:  Heparin level 0.3-0.7 units/ml Monitor platelets by anticoagulation protocol: Yes   Plan:  IV heparin 2000 unit bolus then IV heparin rate of 1050 units/hr Check heparin level 8 hours after start of IV heparin  Daily CBC  Kara Mead 12/25/2021,1:35 PM

## 2021-12-25 NOTE — ED Triage Notes (Signed)
Pt states she felt weak and felt as if her breathing was heavy. [facility], they sent her to r/o PE.

## 2021-12-25 NOTE — Progress Notes (Signed)
Report called to Karleen [facility] ED Charge Nurse regarding pt's status.  Informed Patty that pt has metastatic pancreatic cancer currently on gem/abraxane.  Pt is here for routine chemo and woke up not feeling well, weak, maybe warm but no fever, SATS O2 between 89-90 at rest when talking or walking SATS drops to 86 on short distance ambulation.  Pt became dyspneic, tachypneic, and tachycardic 126.  Pt assigned pt to RM#3 in the ED.  Cira Rue, NP notified infusion that pt's treatment today will be cancelled and Mikey Bussing, NP of the pt's possible ED admission.

## 2021-12-26 ENCOUNTER — Other Ambulatory Visit (HOSPITAL_COMMUNITY): Payer: Medicare Other

## 2021-12-26 ENCOUNTER — Inpatient Hospital Stay (HOSPITAL_COMMUNITY): Payer: Medicare Other

## 2021-12-26 DIAGNOSIS — Z96651 Presence of right artificial knee joint: Secondary | ICD-10-CM | POA: Diagnosis present

## 2021-12-26 DIAGNOSIS — C259 Malignant neoplasm of pancreas, unspecified: Secondary | ICD-10-CM | POA: Diagnosis present

## 2021-12-26 DIAGNOSIS — Z66 Do not resuscitate: Secondary | ICD-10-CM | POA: Diagnosis present

## 2021-12-26 DIAGNOSIS — R531 Weakness: Secondary | ICD-10-CM | POA: Diagnosis present

## 2021-12-26 DIAGNOSIS — I82403 Acute embolism and thrombosis of unspecified deep veins of lower extremity, bilateral: Secondary | ICD-10-CM | POA: Diagnosis not present

## 2021-12-26 DIAGNOSIS — Z87442 Personal history of urinary calculi: Secondary | ICD-10-CM | POA: Diagnosis not present

## 2021-12-26 DIAGNOSIS — Z972 Presence of dental prosthetic device (complete) (partial): Secondary | ICD-10-CM | POA: Diagnosis not present

## 2021-12-26 DIAGNOSIS — Z20822 Contact with and (suspected) exposure to covid-19: Secondary | ICD-10-CM | POA: Diagnosis present

## 2021-12-26 DIAGNOSIS — I10 Essential (primary) hypertension: Secondary | ICD-10-CM | POA: Diagnosis present

## 2021-12-26 DIAGNOSIS — E785 Hyperlipidemia, unspecified: Secondary | ICD-10-CM | POA: Diagnosis present

## 2021-12-26 DIAGNOSIS — I2510 Atherosclerotic heart disease of native coronary artery without angina pectoris: Secondary | ICD-10-CM | POA: Diagnosis present

## 2021-12-26 DIAGNOSIS — G4733 Obstructive sleep apnea (adult) (pediatric): Secondary | ICD-10-CM | POA: Diagnosis present

## 2021-12-26 DIAGNOSIS — I2699 Other pulmonary embolism without acute cor pulmonale: Secondary | ICD-10-CM | POA: Diagnosis present

## 2021-12-26 DIAGNOSIS — K5790 Diverticulosis of intestine, part unspecified, without perforation or abscess without bleeding: Secondary | ICD-10-CM | POA: Diagnosis present

## 2021-12-26 DIAGNOSIS — R5381 Other malaise: Secondary | ICD-10-CM | POA: Diagnosis present

## 2021-12-26 DIAGNOSIS — J90 Pleural effusion, not elsewhere classified: Secondary | ICD-10-CM | POA: Diagnosis present

## 2021-12-26 DIAGNOSIS — I82413 Acute embolism and thrombosis of femoral vein, bilateral: Secondary | ICD-10-CM | POA: Diagnosis present

## 2021-12-26 DIAGNOSIS — I2602 Saddle embolus of pulmonary artery with acute cor pulmonale: Secondary | ICD-10-CM | POA: Diagnosis not present

## 2021-12-26 DIAGNOSIS — K5900 Constipation, unspecified: Secondary | ICD-10-CM | POA: Diagnosis present

## 2021-12-26 DIAGNOSIS — J45909 Unspecified asthma, uncomplicated: Secondary | ICD-10-CM | POA: Diagnosis present

## 2021-12-26 DIAGNOSIS — C786 Secondary malignant neoplasm of retroperitoneum and peritoneum: Secondary | ICD-10-CM | POA: Diagnosis present

## 2021-12-26 DIAGNOSIS — I2609 Other pulmonary embolism with acute cor pulmonale: Secondary | ICD-10-CM | POA: Diagnosis present

## 2021-12-26 DIAGNOSIS — Z79899 Other long term (current) drug therapy: Secondary | ICD-10-CM | POA: Diagnosis not present

## 2021-12-26 DIAGNOSIS — R262 Difficulty in walking, not elsewhere classified: Secondary | ICD-10-CM | POA: Diagnosis present

## 2021-12-26 DIAGNOSIS — I824Y1 Acute embolism and thrombosis of unspecified deep veins of right proximal lower extremity: Secondary | ICD-10-CM | POA: Diagnosis not present

## 2021-12-26 DIAGNOSIS — M1990 Unspecified osteoarthritis, unspecified site: Secondary | ICD-10-CM | POA: Diagnosis present

## 2021-12-26 DIAGNOSIS — E782 Mixed hyperlipidemia: Secondary | ICD-10-CM | POA: Diagnosis not present

## 2021-12-26 DIAGNOSIS — Z888 Allergy status to other drugs, medicaments and biological substances status: Secondary | ICD-10-CM | POA: Diagnosis not present

## 2021-12-26 LAB — COMPREHENSIVE METABOLIC PANEL
ALT: 23 U/L (ref 0–44)
AST: 18 U/L (ref 15–41)
Albumin: 3.2 g/dL — ABNORMAL LOW (ref 3.5–5.0)
Alkaline Phosphatase: 68 U/L (ref 38–126)
Anion gap: 9 (ref 5–15)
BUN: 20 mg/dL (ref 8–23)
CO2: 27 mmol/L (ref 22–32)
Calcium: 9.5 mg/dL (ref 8.9–10.3)
Chloride: 104 mmol/L (ref 98–111)
Creatinine, Ser: 0.63 mg/dL (ref 0.44–1.00)
GFR, Estimated: >60 mL/min (ref >60)
Glucose, Bld: 143 mg/dL — ABNORMAL HIGH (ref 70–99)
Potassium: 4.3 mmol/L (ref 3.5–5.1)
Sodium: 140 mmol/L (ref 135–145)
Total Bilirubin: 0.8 mg/dL (ref 0.3–1.2)
Total Protein: 7.3 g/dL (ref 6.5–8.1)

## 2021-12-26 LAB — CBC
HCT: 35.1 % — ABNORMAL LOW (ref 36.0–46.0)
Hemoglobin: 11.2 g/dL — ABNORMAL LOW (ref 12.0–15.0)
MCH: 30.2 pg (ref 26.0–34.0)
MCHC: 31.9 g/dL (ref 30.0–36.0)
MCV: 94.6 fL (ref 80.0–100.0)
Platelets: 141 10*3/uL — ABNORMAL LOW (ref 150–400)
RBC: 3.71 MIL/uL — ABNORMAL LOW (ref 3.87–5.11)
RDW: 16.2 % — ABNORMAL HIGH (ref 11.5–15.5)
WBC: 5.7 10*3/uL (ref 4.0–10.5)
nRBC: 0 % (ref 0.0–0.2)

## 2021-12-26 LAB — CANCER ANTIGEN 19-9: CA 19-9: 2709 U/mL — ABNORMAL HIGH (ref 0–35)

## 2021-12-26 LAB — ECHOCARDIOGRAM COMPLETE
Area-P 1/2: 2.48 cm2
Height: 64 in
S' Lateral: 2 cm
Weight: 2841.61 oz

## 2021-12-26 LAB — HEPARIN LEVEL (UNFRACTIONATED)
Heparin Unfractionated: 0.87 IU/mL — ABNORMAL HIGH (ref 0.30–0.70)
Heparin Unfractionated: 0.46 IU/mL (ref 0.30–0.70)

## 2021-12-26 MED ORDER — CHLORHEXIDINE GLUCONATE CLOTH 2 % EX PADS
6.0000 | MEDICATED_PAD | Freq: Every day | CUTANEOUS | Status: DC
  Administered 2021-12-26 – 2021-12-30 (×5): 6 via TOPICAL

## 2021-12-26 MED ORDER — BISACODYL 10 MG RE SUPP
10.0000 mg | Freq: Once | RECTAL | Status: AC
Start: 2021-12-26 — End: 2021-12-26
  Administered 2021-12-26: 10 mg via RECTAL
  Filled 2021-12-26: qty 1

## 2021-12-26 MED ORDER — SENNOSIDES-DOCUSATE SODIUM 8.6-50 MG PO TABS
2.0000 | ORAL_TABLET | Freq: Two times a day (BID) | ORAL | Status: DC
  Administered 2021-12-26 – 2021-12-30 (×8): 2 via ORAL
  Filled 2021-12-26 (×9): qty 2

## 2021-12-26 NOTE — Progress Notes (Signed)
Brief PCCM Progress Note  HDS overnight, heparin reaching therapeutic level, Hb stable.   PCCM will sign off but glad to be reinvolved as condition changes  [place]

## 2021-12-26 NOTE — Progress Notes (Signed)
ANTICOAGULATION CONSULT NOTE  Pharmacy Consult for IV heparin  Indication: DVT, bilateral PE  Allergies  Allergen Reactions   Lipitor [Atorvastatin]     Stomach pains, heart attack sx's    Patient Measurements: Height: '5\' 4"'$  (162.6 cm) Weight: 80.6 kg (177 lb 9.6 oz) IBW/kg (Calculated) : 54.7 Heparin Dosing Weight: 72 kg  Vital Signs: Temp: 98.3 F (36.8 C) (07/04 0245) Temp Source: Oral (07/04 0245) BP: 125/72 (07/04 0245) Pulse Rate: 88 (07/04 0245)  Labs: Recent Labs    12/25/21 1001 12/25/21 1134 12/25/21 1335 12/25/21 2200 12/26/21 0511 12/26/21 0737  HGB 11.8* 11.7*  --   --  11.2*  --   HCT 35.5* 35.9*  --   --  35.1*  --   PLT 152 148*  --   --  141*  --   HEPARINUNFRC  --   --   --  0.53 0.87* 0.46  CREATININE 0.61 0.50  --   --  0.63  --   TROPONINIHS  --  22* 23*  --   --   --      Estimated Creatinine Clearance: 54.8 mL/min (by C-G formula based on SCr of 0.63 mg/dL).   Medical History: Past Medical History:  Diagnosis Date   Anemia    Coronary artery disease cardiologist---dr berry   cardiac cath --   10/16/05 revealing noncritical CAD   Cyst of left kidney    small   Diverticulosis    Heart murmur    History of colon polyps    History of kidney stones    Hyperlipidemia    Hypertension    Leiomyoma of uterus    Liver cyst    small   OSA on CPAP    Osteoarthritis    pancreatic ca 29/7989   Periumbilical hernia    Moderate   PMB (postmenopausal bleeding)    Vulvar cyst    Wears dentures    upper    Medications:  Scheduled:  Infusions:   Assessment: 86 yo female presented from cancer center with hypoxia and tachycardia to rule out PE/DVT. Pharmacy consulted for heparin management.   Lower extremity ultrasound positive DVT right and left and CTA chest positive bilateral PE with RHS.  Baseline CBC: Hg 11.7; pltc 148k- slightly low. Patient not on any anticoagulation prior to [facility]  12/26/2021: Heparin level 0.46 - remains  therapeutic on IV heparin 1050 units/hr CBC stable No bleeding or infusion related issues reported by RN  Goal of Therapy:  Heparin level 0.3-0.7 units/ml Monitor platelets by anticoagulation protocol: Yes   Plan:  Continue IV heparin infusion at 1050 units/hr Daily CBC & heparin level while on heparin Monitor for signs and symptoms of bleeding Follow up transition to oral anticoagulation as appropriate  Tawnya Crook, PharmD, BCPS Clinical Pharmacist 12/26/2021 9:24 AM

## 2021-12-26 NOTE — Progress Notes (Signed)
[facility]                                                                               [name], is a 86 y.o. female, DOB - Dec 23, 1935, ION:[id number] Admit date - 12/25/2021    Outpatient Primary MD for the patient is Crist Infante, MD  LOS - 0  days    Brief summary   Kathryn Kent is a 86 y.o. female with a pertinent history of pancreatic cancer on chemotherapy who presents to the emergency department from her oncologist office due to concern for possible PE. CTA showed a  submassive PE and was started on heparin drip. Ultrasound of the lower extremity showed an acute DVT of especially of the right common femoral vein.  On the left has an age-indeterminate DVT   Assessment & Plan    Assessment and Plan:   Acute pulmonary embolism submassive in nature ECHO is pending. Ordered to check for right heart strain.  She was started on IV heparin.  If she was found to have right heart strain, she will need 72 hours of IV heparin and transition to [place] on discharge.    H/o Pancreatic cancer s/p chemo.  Recommend outpatient follow up .     CAD  Stable, no chest pain.    OSA on CPAP.    Hypertension:  Well controlled.    ASthma;  No wheezing heard.    Hyperlipidemia:  Resume [place].      Estimated body mass index is 30.49 kg/m as calculated from the following:   Height as of this encounter: '5\' 4"'$  (1.626 m).   Weight as of this encounter: 80.6 kg.  Code Status: DNR.  DVT Prophylaxis:  Place TED hose Start: 12/25/21 1725   Level of Care: Level of care: Telemetry Family Communication: Updated patient's   Disposition Plan:     Remains inpatient appropriate:  IV heparin.   Procedures:  Echocardiogram.   Consultants:   pCCM.   Antimicrobials:   Anti-infectives (From admission, onward)    None        Medications  Scheduled Meds:  Chlorhexidine Gluconate Cloth  6 each Topical Daily   ezetimibe  10 mg Oral Daily    montelukast  10 mg Oral QHS   senna-docusate  2 tablet Oral BID   simvastatin  20 mg Oral q1800   Continuous Infusions:  heparin 1,050 Units/hr (12/26/21 1034)   PRN Meds:.acetaminophen **OR** acetaminophen, ondansetron, polyethylene glycol    Subjective:   [name] was seen and examined today.  Breathing is improved. No chest pain.   Objective:   Vitals:   12/25/21 1903 12/25/21 2233 12/26/21 0245 12/26/21 1716  BP: (!) 145/90 130/83 125/72 (!) 133/96  Pulse: 92 82 88 89  Resp: '16 18 18 15  '$ Temp: 98.4 F (36.9 C) 98.7 F (37.1 C) 98.3 F (36.8 C) 98.2 F (36.8 C)  TempSrc: Oral Oral Oral Oral  SpO2: 95% 95% 99% 96%  Weight:      Height:        Intake/Output Summary (Last 24 hours) at 12/26/2021 1717 Last data filed at 12/26/2021 1245 Gross per 24 hour  Intake 665.68 ml  Output --  Net 665.68 ml   Filed Weights   12/25/21 1124  Weight: 80.6 kg     Exam General: Alert and oriented x 3, NAD Cardiovascular: S1 S2 auscultated, no murmurs, RRR Respiratory: Clear to auscultation bilaterally, no wheezing, rales or rhonchi Gastrointestinal: Soft, nontender, nondistended, + bowel sounds Ext: no pedal edema bilaterally Neuro: AAOx3, Cr N's II- XII. Strength 5/5 upper and lower extremities bilaterally Skin: No rashes Psych: Normal affect and demeanor, alert and oriented x3    Data Reviewed:  I have personally reviewed following labs and imaging studies   CBC Lab Results  Component Value Date   WBC 5.7 12/26/2021   RBC 3.71 (L) 12/26/2021   HGB 11.2 (L) 12/26/2021   HCT 35.1 (L) 12/26/2021   MCV 94.6 12/26/2021   MCH 30.2 12/26/2021   PLT 141 (L) 12/26/2021   MCHC 31.9 12/26/2021   RDW 16.2 (H) 12/26/2021   LYMPHSABS 0.9 12/25/2021   MONOABS 0.7 12/25/2021   EOSABS 0.1 12/25/2021   BASOSABS 0.1 93/71/6967     Last metabolic panel Lab Results  Component Value Date   NA 140 12/26/2021   K 4.3 12/26/2021   CL 104 12/26/2021   CO2 27 12/26/2021    BUN 20 12/26/2021   CREATININE 0.63 12/26/2021   GLUCOSE 143 (H) 12/26/2021   GFRNONAA >60 12/26/2021   GFRAA >60 12/30/2018   CALCIUM 9.5 12/26/2021   PROT 7.3 12/26/2021   ALBUMIN 3.2 (L) 12/26/2021   BILITOT 0.8 12/26/2021   ALKPHOS 68 12/26/2021   AST 18 12/26/2021   ALT 23 12/26/2021   ANIONGAP 9 12/26/2021    CBG (last 3)  No results for input(s): "GLUCAP" in the last 72 hours.    Coagulation Profile: No results for input(s): "INR", "PROTIME" in the last 168 hours.   Radiology Studies: ECHOCARDIOGRAM COMPLETE  Result Date: 12/26/2021    ECHOCARDIOGRAM REPORT   Patient Name:   BURGUNDY Kent [facility] Date of Exam: 12/26/2021 Medical Rec #:  [id number]           Height:       64.0 in Accession #:    [id number]          Weight:       177.6 lb Date of Birth:  08/06/1935          BSA:          1.860 m Patient Age:    46 years            BP:           125/72 mmHg Patient Gender: F                   HR:           81 bpm. Exam Location:  Inpatient Procedure: 2D Echo, Color Doppler and Cardiac Doppler Indications:    I26.02 Pulmonary embolus  History:        Patient has no prior history of Echocardiogram examinations.                 Risk Factors:Hypertension, Dyslipidemia and Sleep Apnea.  Sonographer:    Raquel Sarna Senior RDCS Referring Phys: Theola Sequin  Sonographer Comments: VERY poor echo windows due to lung interference, entire study performed from subcostal. IMPRESSIONS  1. Limited study due to very poor echo windows.  2. Left ventricular ejection fraction, by estimation, is 70 to 75%. The left ventricle has hyperdynamic function. Left  ventricular endocardial border not optimally defined to evaluate regional wall motion. Left ventricular diastolic parameters are consistent with Grade I diastolic dysfunction (impaired relaxation).  3. Right ventricular systolic function was not well visualized but appears grossly normal on limited views. The right ventricular size is not well visualized. There  is normal pulmonary artery systolic pressure. The estimated right ventricular systolic pressure is 98.9 mmHg.  4. The mitral valve is grossly normal. Trivial mitral valve regurgitation. Moderate mitral annular calcification.  5. The aortic valve is tricuspid. There is mild calcification of the aortic valve. There is mild thickening of the aortic valve. Aortic valve regurgitation is not visualized. Aortic valve sclerosis/calcification is present, without any evidence of aortic stenosis.  6. The inferior vena cava is normal in size with greater than 50% respiratory variability, suggesting right atrial pressure of 3 mmHg. FINDINGS  Left Ventricle: Left ventricular ejection fraction, by estimation, is 70 to 75%. The left ventricle has hyperdynamic function. Left ventricular endocardial border not optimally defined to evaluate regional wall motion. The left ventricular internal cavity size was normal in size. There is no left ventricular hypertrophy. Left ventricular diastolic parameters are consistent with Grade I diastolic dysfunction (impaired relaxation). Right Ventricle: The right ventricular size is not well visualized. Right vetricular wall thickness was not well visualized. Right ventricular systolic function was not well visualized. There is normal pulmonary artery systolic pressure. The tricuspid regurgitant velocity is 2.46 m/s, and with an assumed right atrial pressure of 3 mmHg, the estimated right ventricular systolic pressure is 21.1 mmHg. Left Atrium: Left atrial size was not well visualized. Right Atrium: Right atrial size was not well visualized. Pericardium: There is no evidence of pericardial effusion. Mitral Valve: The mitral valve is grossly normal. Moderate mitral annular calcification. Trivial mitral valve regurgitation. Tricuspid Valve: The tricuspid valve is normal in structure. Tricuspid valve regurgitation is trivial. Aortic Valve: The aortic valve is tricuspid. There is mild calcification of  the aortic valve. There is mild thickening of the aortic valve. Aortic valve regurgitation is not visualized. Aortic valve sclerosis/calcification is present, without any evidence of aortic stenosis. Pulmonic Valve: The pulmonic valve was normal in structure. Pulmonic valve regurgitation is trivial. Aorta: The aortic root is normal in size and structure. Venous: The inferior vena cava is normal in size with greater than 50% respiratory variability, suggesting right atrial pressure of 3 mmHg. IAS/Shunts: The atrial septum is grossly normal.  LEFT VENTRICLE PLAX 2D LVIDd:         3.70 cm   Diastology LVIDs:         2.00 cm   LV e' medial:    6.15 cm/s LV PW:         0.90 cm   LV E/e' medial:  6.1 LV IVS:        0.90 cm   LV e' lateral:   7.09 cm/s LVOT diam:     1.90 cm   LV E/e' lateral: 5.3 LVOT Area:     2.84 cm  RIGHT VENTRICLE RV S prime:     19.00 cm/s TAPSE (M-mode): 2.2 cm LEFT ATRIUM           Index        RIGHT ATRIUM          Index LA diam:      3.30 cm 1.77 cm/m   RA Area:     8.21 cm LA Vol (A4C): 67.6 ml 36.37 ml/m  RA Volume:   13.40 ml 7.20  ml/m   AORTA Ao Root diam: 3.10 cm MITRAL VALVE               TRICUSPID VALVE MV Area (PHT): 2.48 cm    TR Peak grad:   24.2 mmHg MV Decel Time: 306 msec    TR Vmax:        246.00 cm/s MV E velocity: 37.70 cm/s MV A velocity: 82.30 cm/s  SHUNTS MV E/A ratio:  0.46        Systemic Diam: 1.90 cm Gwyndolyn Kaufman MD Electronically signed by Gwyndolyn Kaufman MD Signature Date/Time: 12/26/2021/1:55:41 PM    Final    VAS [place] LOWER EXTREMITY VENOUS (DVT) (ONLY [facility] & [facility])  Result Date: 12/25/2021  Lower Venous DVT Study Patient Name:  SELINDA KORZENIEWSKI [facility]  Date of Exam:   12/25/2021 Medical Rec #: [id number]            Accession #:    [id number] Date of Birth: [birth year]           Patient Gender: F Patient Age:   3 years Exam Location:  [facility] Procedure:      VAS [place] LOWER EXTREMITY VENOUS (DVT) Referring Phys: Regan Lemming  --------------------------------------------------------------------------------  Indications: Swelling, SOB, and Right lower extremity swelling.  Risk Factors: Cancer Pancreatic. 08/30/2021 CT showed DVT (Central filling defect within an enlarged left femoral vein) no follow up ultrasound ordered. Comparison Study: No prior study on file Performing Technologist: Sharion Dove RVS  Examination Guidelines: A complete evaluation includes B-mode imaging, spectral Doppler, color Doppler, and power Doppler as needed of all accessible portions of each vessel. Bilateral testing is considered an integral part of a complete examination. Limited examinations for reoccurring indications may be performed as noted. The reflux portion of the exam is performed with the patient in reverse Trendelenburg.  +----------+---------------+---------+-----------+----------+-----------------+ RIGHT     CompressibilityPhasicitySpontaneityPropertiesThrombus Aging    +----------+---------------+---------+-----------+----------+-----------------+ CFV       Full           Yes      No                   Acute, proximal                                                          portion           +----------+---------------+---------+-----------+----------+-----------------+ SFJ       Full                                                           +----------+---------------+---------+-----------+----------+-----------------+ FV Prox   Partial        Yes      Yes                  Acute             +----------+---------------+---------+-----------+----------+-----------------+ FV Mid    None           No       No                   Acute             +----------+---------------+---------+-----------+----------+-----------------+  FV Distal None           No       No                   Acute             +----------+---------------+---------+-----------+----------+-----------------+ PFV       Partial         Yes      No                   Acute             +----------+---------------+---------+-----------+----------+-----------------+ POP       None           No       No                   Acute             +----------+---------------+---------+-----------+----------+-----------------+ PTV       None           No       No                   Acute             +----------+---------------+---------+-----------+----------+-----------------+ PERO      None                                         Acute             +----------+---------------+---------+-----------+----------+-----------------+ Gastroc   None           No       No                   Acute             +----------+---------------+---------+-----------+----------+-----------------+ Distal CIV               Yes      Yes                  patent            +----------+---------------+---------+-----------+----------+-----------------+   +----------+---------------+---------+-----------+----------+-----------------+ LEFT      CompressibilityPhasicitySpontaneityPropertiesThrombus Aging    +----------+---------------+---------+-----------+----------+-----------------+ CFV       Partial        Yes      Yes                  Age Indeterminate +----------+---------------+---------+-----------+----------+-----------------+ SFJ       Full                                                           +----------+---------------+---------+-----------+----------+-----------------+ FV Prox   Partial        Yes      Yes                  Age Indeterminate +----------+---------------+---------+-----------+----------+-----------------+ FV Mid    Partial        No       No                   Age Indeterminate +----------+---------------+---------+-----------+----------+-----------------+ FV Distal Partial  No       No                   Age Indeterminate  +----------+---------------+---------+-----------+----------+-----------------+ PFV       Partial        Yes      Yes                  Age Indeterminate +----------+---------------+---------+-----------+----------+-----------------+ POP       Partial        No       No                   Age Indeterminate +----------+---------------+---------+-----------+----------+-----------------+ PTV       None           No       No                   Age Indeterminate +----------+---------------+---------+-----------+----------+-----------------+ PERO      None                                         Age Indeterminate +----------+---------------+---------+-----------+----------+-----------------+ Gastroc   Full                                                           +----------+---------------+---------+-----------+----------+-----------------+ EIV                      Yes      Yes                  patent            +----------+---------------+---------+-----------+----------+-----------------+ Distal CIV               Yes      Yes                  patent            +----------+---------------+---------+-----------+----------+-----------------+     Summary: RIGHT: - Findings consistent with acute deep vein thrombosis involving the right common femoral vein, right femoral vein, right proximal profunda vein, right popliteal vein, right posterior tibial veins, right peroneal veins, and right gastrocnemius veins. no obvious propagation to the distal common iliac vein  LEFT: - Findings consistent with age indeterminate deep vein thrombosis involving the left common femoral vein, left femoral vein, left proximal profunda vein, left posterior tibial veins, left popliteal vein, and left peroneal veins. No obvious propagation to the EIV or to the distal common iliac veins.  *See table(s) above for measurements and observations. Electronically signed by Deitra Mayo MD on 12/25/2021  at 4:27:46 PM.    Final    CT Angio Chest PE W and/or Wo Contrast  Result Date: 12/25/2021 CLINICAL DATA:  Pancreatic carcinoma, shortness of breath, weakness EXAM: CT ANGIOGRAPHY CHEST WITH CONTRAST TECHNIQUE: Multidetector CT imaging of the chest was performed using the standard protocol during bolus administration of intravenous contrast. Multiplanar CT image reconstructions and MIPs were obtained to evaluate the vascular anatomy. RADIATION DOSE REDUCTION: This exam was performed according to the departmental dose-optimization program which includes automated exposure control, adjustment of the mA and/or kV according to patient size  and/or use of iterative reconstruction technique. CONTRAST:  20m OMNIPAQUE IOHEXOL 350 MG/ML SOLN COMPARISON:  09/06/2021 FINDINGS: Cardiovascular: Right IJ port catheter to the distal SVC. Heart size normal. No pericardial effusion. Abnormally increased RV/LV ratio 1.43. Good contrast opacification of pulmonary arterial tree. There are central near occlusive central emboli in right upper lobe pulmonary artery extending into segmental branches distally. On the left there is a saddle embolus extending into left upper lobe, lingular, and lower lobe segmental branches. Moderate coronary calcifications. Good contrast opacification of the thoracic aorta without aneurysm, dissection, or stenosis. Scattered calcified atheromatous plaque in the arch and proximal descending thoracic aorta which is nondilated. Mediastinum/Nodes: Small hiatal hernia. No mediastinal hematoma, mass or adenopathy. Lungs/Pleura: Some interval increase in posterior right pleural effusion, with mixed-attenuation loculated component in the major fissure also increased in size. No left effusion. 4 mm central left upper lobe nodule previously 5 mm. 1 cm right middle lobe nodule (Im104,Se6, previously 7 mm. More inferiorly 1.3 cm perifissural nodule, previously 1.1 cm.) Upper Abdomen: 4.1 cm infiltrative pancreatic  tail lesion grossly unchanged. Stable subcentimeter low-attenuation nonspecific liver lesions. Musculoskeletal: Anterior vertebral endplate spurring at multiple levels in the mid and lower thoracic spine. Bilateral shoulder DJD. Review of the MIP images confirms the above findings. IMPRESSION: 1. Positive for bilateral pulmonary emboli with CT evidence of right heart strain (RV/LV Ratio = 1.43) consistent with at least submassive (intermediate risk) PE. The presence of right heart strain has been associated with an increased risk of morbidity and mortality. Please refer to the "Code PE Focused" order set in EPIC. Critical Value/emergent results were called by telephone at the time of interpretation on 12/25/2021 at 3:36 pm to provider [facility], who verbally acknowledged these results. 2. Slight increase in right pleural effusion. 3. Stable pancreatic tail mass with marginal progression of pulmonary nodules. 4. Coronary and Aortic Atherosclerosis (ICD10-170.0). Electronically Signed   By: DLucrezia EuropeM.D.   On: 12/25/2021 15:36   DG Chest Port 1 View  Result Date: 12/25/2021 CLINICAL DATA:  An 86year old female present as with shortness of breath with history of pancreatic cancer. EXAM: PORTABLE CHEST 1 VIEW COMPARISON:  September 06, 2021. FINDINGS: RIGHT-sided Port-A-Cath in place, tip in the area of the distal superior vena cava or caval to atrial junction. EKG leads project over the patient's chest. Cardiomediastinal contours and hilar structures are stable. Increasing opacity in the RIGHT lower chest in this patient with known nodules in this area and fissural thickening. Suspect small RIGHT-sided pleural effusion as well. No lobar level consolidative process. No pneumothorax. On limited assessment there is no acute skeletal finding with marked glenohumeral degenerative changes RIGHT greater than LEFT. IMPRESSION: Increasing opacity in the RIGHT lower chest in this patient with known nodules in this area and  fissural thickening. Suspect small RIGHT-sided pleural effusion as well. This may reflect worsening of disease in the chest or superimposed infection or inflammation. Electronically Signed   By: GZetta BillsM.D.   On: 12/25/2021 12:41       VHosie PoissonM.D. [facility] 12/26/2021, 5:17 PM  Available via Epic secure chat 7am-7pm After 7 pm, please refer to night coverage provider listed on amion.

## 2021-12-27 DIAGNOSIS — E782 Mixed hyperlipidemia: Secondary | ICD-10-CM | POA: Diagnosis not present

## 2021-12-27 DIAGNOSIS — I2699 Other pulmonary embolism without acute cor pulmonale: Secondary | ICD-10-CM | POA: Diagnosis not present

## 2021-12-27 DIAGNOSIS — I10 Essential (primary) hypertension: Secondary | ICD-10-CM | POA: Diagnosis not present

## 2021-12-27 DIAGNOSIS — K8689 Other specified diseases of pancreas: Secondary | ICD-10-CM

## 2021-12-27 DIAGNOSIS — I2510 Atherosclerotic heart disease of native coronary artery without angina pectoris: Secondary | ICD-10-CM

## 2021-12-27 DIAGNOSIS — I82409 Acute embolism and thrombosis of unspecified deep veins of unspecified lower extremity: Secondary | ICD-10-CM | POA: Diagnosis present

## 2021-12-27 LAB — CBC
HCT: 32.6 % — ABNORMAL LOW (ref 36.0–46.0)
Hemoglobin: 10.5 g/dL — ABNORMAL LOW (ref 12.0–15.0)
MCH: 30.3 pg (ref 26.0–34.0)
MCHC: 32.2 g/dL (ref 30.0–36.0)
MCV: 94.2 fL (ref 80.0–100.0)
Platelets: 132 10*3/uL — ABNORMAL LOW (ref 150–400)
RBC: 3.46 MIL/uL — ABNORMAL LOW (ref 3.87–5.11)
RDW: 15.9 % — ABNORMAL HIGH (ref 11.5–15.5)
WBC: 4.9 10*3/uL (ref 4.0–10.5)
nRBC: 0 % (ref 0.0–0.2)

## 2021-12-27 LAB — HEPARIN LEVEL (UNFRACTIONATED): Heparin Unfractionated: 0.37 IU/mL (ref 0.30–0.70)

## 2021-12-27 MED ORDER — ONDANSETRON HCL 4 MG/2ML IJ SOLN
4.0000 mg | Freq: Four times a day (QID) | INTRAMUSCULAR | Status: DC | PRN
Start: 2021-12-27 — End: 2021-12-30
  Administered 2021-12-27 – 2021-12-29 (×5): 4 mg via INTRAVENOUS
  Filled 2021-12-27 (×6): qty 2

## 2021-12-27 NOTE — Progress Notes (Signed)
Patient had small nose bleed, resolved on its own. Pharmacy and MD notified, pt is on heparin drip.

## 2021-12-27 NOTE — Progress Notes (Signed)
SATURATION QUALIFICATIONS: (This note is used to comply with regulatory documentation for home oxygen)  Patient Saturations on Room Air at Rest =97 %  Patient Saturations on Room Air while Ambulating = 95%    

## 2021-12-27 NOTE — Progress Notes (Signed)
PROGRESS NOTE    Kathryn Kent  QQP:[id number] DOB: Jan 03, 1936 DOA: 12/25/2021 PCP: Crist Infante, MD    Brief Narrative:   Kathryn Kent is a 86 y.o. female with past medical history significant for pancreatic cancer on chemotherapy who was sent over from the cancer center to [facility] ED for progressive shortness of breath given concern for possible pulmonary embolism.  Patient reported that she developed sudden onset shortness of breath earlier this morning, and she knew she had an appointment with her oncology providers.  Patient also reported difficulty walking.  Denied chest pain, no hemoptysis or palpitations.  In the ED, patient was noted to have a SPO2 of 84% on room air and placed on 4 L nasal cannula, RR 28, temperature 98.1 F, BP 136/80.  WBC 5.9, hemoglobin 11.8, platelets 152.  Sodium 138, potassium 3.6, chloride 103, CO2 28, glucose 150.  BUN 21, creatinine 0.61.  AST 17, ALT 22, total bilirubin 0.7.  COVID-19 PCR negative.  Influenza A/B PCR negative.  Chest x-ray with increasing opacity right lower chest with no nodules visual thickening, suspected right-sided small pleural effusion.  CT angiogram chest PE study with positive bilateral pulmonary emboli with CT evidence of right heart strain, slight increased right pleural effusion, stable pancreatic tail mass with marginal progression of pulmonary nodules.  Patient was started on IV heparin drip.  TRH consulted for further evaluation and management of acute hypoxic respiratory failure in the setting of pulmonary embolism.  Assessment & Plan:   Pulmonary embolism, bilateral Bilateral lower extremity DVT Patient presenting with acute shortness of breath.  CT angiogram chest with positive bilateral pulm emboli with CT evidence of right heart strain.  Bilateral duplex ultrasound lower extremities positive for acute DVT right lower extremity and age-indeterminate DVT left lower extremity.  TTE with LVEF 7124%, grade 1 diastolic  dysfunction, RV systolic function not well visualized but appears grossly normal, trivial MR, IVC normal in size.  Titrated off of supplemental oxygen, no desaturation on ambulatory O2 screen 7/5. --Continue heparin drip, anticipate transition to [place] on 7/6  Pancreatic cancer Follows with medical oncology outpatient, Dr. Burr Medico. Currently on chemotherapy.  Continue outpatient follow-up.  CAD Stable, denies chest pain. --Continue statin  OSA: Continue nocturnal CPAP  Essential hypertension On amlodipine 10 mg p.o. daily, chlorthalidone 12.5 mg p.o. daily, irbesartan 300 mg p.o. daily outpatient. --BP currently 123/80 off of antihypertensives --Continue to hold home Antivert tenses --Monitor BP closely  Asthma Stable, now titrated off of supple oxygen as above.  No wheezing. --Singulair 10 mg p.o. nightly  Hyperlipidemia --Simvastatin 20 g p.o. daily --Zetia 10 mg p.o. daily  DVT prophylaxis: Place TED hose Start: 12/25/21 1725    Code Status: DNR Family Communication: No family present at bedside this morning  Disposition Plan:  Level of care: Telemetry Status is: Inpatient Remains inpatient appropriate because: Continues on IV heparin drip, anticipate transition to oral anticoagulant tomorrow, likely discharge home tomorrow thereafter.    Consultants:  Pulmonology/critical care medicine, Dr. Verlee Monte  Procedures:  TTE Bilateral vascular duplex ultrasound lower extremities  Antimicrobials:  None   Subjective: Patient seen examined bedside, resting comfortably.  Remains on IV heparin drip.  Discussed with patient we will anticipate transitioning to oral agent tomorrow.  No other questions or concerns at this time.  Titrated off of supplemental oxygen this afternoon with no desaturations on ambulatory O2 screen.  Patient denies headache, no dizziness, no chest pain, no palpitations, no current shortness of breath, no abdominal  pain, no focal weakness, no fever/chills/night  sweats, no nausea/vomiting/diarrhea, no fatigue, no paresthesias.  No acute events overnight per nursing staff.  Objective: Vitals:   12/26/21 1716 12/26/21 2121 12/27/21 0409 12/27/21 1354  BP: (!) 133/96 120/83 137/84 123/80  Pulse: 89 79 85 87  Resp: '15 18 20 18  '$ Temp: 98.2 F (36.8 C) 97.9 F (36.6 C) 98.9 F (37.2 C) 98.9 F (37.2 C)  TempSrc: Oral Oral Oral Oral  SpO2: 96% 98% 98% 90%  Weight:      Height:        Intake/Output Summary (Last 24 hours) at 12/27/2021 1637 Last data filed at 12/27/2021 0857 Gross per 24 hour  Intake 480 ml  Output --  Net 480 ml   Filed Weights   12/25/21 1124  Weight: 80.6 kg    Examination:  Physical Exam: GEN: NAD, alert and oriented x 3, wd/wn HEENT: NCAT, PERRL, EOMI, sclera clear, MMM PULM: CTAB w/o wheezes/crackles, normal respiratory effort, on room air CV: RRR w/o M/G/R GI: abd soft, NTND, NABS, no R/G/M MSK: no peripheral edema, muscle strength globally intact 5/5 bilateral upper/lower extremities NEURO: CN II-XII intact, no focal deficits, sensation to light touch intact PSYCH: normal mood/affect Integumentary: dry/intact, no rashes or wounds    Data Reviewed: I have personally reviewed following labs and imaging studies  CBC: Recent Labs  Lab 12/25/21 1001 12/25/21 1134 12/26/21 0511 12/27/21 0507  WBC 5.9 6.1 5.7 4.9  NEUTROABS 4.2 4.4  --   --   HGB 11.8* 11.7* 11.2* 10.5*  HCT 35.5* 35.9* 35.1* 32.6*  MCV 90.6 94.5 94.6 94.2  PLT 152 148* 141* 597*   Basic Metabolic Panel: Recent Labs  Lab 12/25/21 1001 12/25/21 1134 12/26/21 0511  NA 138 139 140  K 3.6 3.7 4.3  CL 103 105 104  CO2 '28 27 27  '$ GLUCOSE 150* 139* 143*  BUN '21 23 20  '$ CREATININE 0.61 0.50 0.63  CALCIUM 10.0 9.6 9.5   GFR: Estimated Creatinine Clearance: 54.8 mL/min (by C-G formula based on SCr of 0.63 mg/dL). Liver Function Tests: Recent Labs  Lab 12/25/21 1001 12/25/21 1134 12/26/21 0511  AST '17 18 18  '$ ALT '22 25 23   '$ ALKPHOS 85 76 68  BILITOT 0.7 0.8 0.8  PROT 7.8 7.5 7.3  ALBUMIN 3.7 3.5 3.2*   No results for input(s): "LIPASE", "AMYLASE" in the last 168 hours. No results for input(s): "AMMONIA" in the last 168 hours. Coagulation Profile: No results for input(s): "INR", "PROTIME" in the last 168 hours. Cardiac Enzymes: No results for input(s): "CKTOTAL", "CKMB", "CKMBINDEX", "TROPONINI" in the last 168 hours. BNP (last 3 results) No results for input(s): "PROBNP" in the last 8760 hours. HbA1C: No results for input(s): "HGBA1C" in the last 72 hours. CBG: No results for input(s): "GLUCAP" in the last 168 hours. Lipid Profile: No results for input(s): "CHOL", "HDL", "LDLCALC", "TRIG", "CHOLHDL", "LDLDIRECT" in the last 72 hours. Thyroid Function Tests: No results for input(s): "TSH", "T4TOTAL", "FREET4", "T3FREE", "THYROIDAB" in the last 72 hours. Anemia Panel: No results for input(s): "VITAMINB12", "FOLATE", "FERRITIN", "TIBC", "IRON", "RETICCTPCT" in the last 72 hours. Sepsis Labs: No results for input(s): "PROCALCITON", "LATICACIDVEN" in the last 168 hours.  Recent Results (from the past 240 hour(s))  Resp Panel by RT-PCR (Flu A&B, Covid) Anterior Nasal Swab     Status: None   Collection Time: 12/25/21 11:34 AM   Specimen: Anterior Nasal Swab  Result Value Ref Range Status   SARS Coronavirus 2  by RT PCR NEGATIVE NEGATIVE Final    Comment: (NOTE) SARS-CoV-2 target nucleic acids are NOT DETECTED.  The SARS-CoV-2 RNA is generally detectable in upper respiratory specimens during the acute phase of infection. The lowest concentration of SARS-CoV-2 viral copies this assay can detect is 138 copies/mL. A negative result does not preclude SARS-Cov-2 infection and should not be used as the sole basis for treatment or other patient management decisions. A negative result may occur with  improper specimen collection/handling, submission of specimen other than nasopharyngeal swab, presence of  viral mutation(s) within the areas targeted by this assay, and inadequate number of viral copies(<138 copies/mL). A negative result must be combined with clinical observations, patient history, and epidemiological information. The expected result is Negative.  Fact Sheet for Patients:  EntrepreneurPulse.com.au  Fact Sheet for Healthcare Providers:  IncredibleEmployment.be  This test is no t yet approved or cleared by the [place] FDA and  has been authorized for detection and/or diagnosis of SARS-CoV-2 by FDA under an Emergency Use Authorization (EUA). This EUA will remain  in effect (meaning this test can be used) for the duration of the COVID-19 declaration under Section 564(b)(1) of the Act, 21 U.S.C.section 360bbb-3(b)(1), unless the authorization is terminated  or revoked sooner.       Influenza A by PCR NEGATIVE NEGATIVE Final   Influenza B by PCR NEGATIVE NEGATIVE Final    Comment: (NOTE) The Xpert Xpress SARS-CoV-2/FLU/RSV plus assay is intended as an aid in the diagnosis of influenza from Nasopharyngeal swab specimens and should not be used as a sole basis for treatment. Nasal washings and aspirates are unacceptable for Xpert Xpress SARS-CoV-2/FLU/RSV testing.  Fact Sheet for Patients: EntrepreneurPulse.com.au  Fact Sheet for Healthcare Providers: IncredibleEmployment.be  This test is not yet approved or cleared by the [place] FDA and has been authorized for detection and/or diagnosis of SARS-CoV-2 by FDA under an Emergency Use Authorization (EUA). This EUA will remain in effect (meaning this test can be used) for the duration of the COVID-19 declaration under Section 564(b)(1) of the Act, 21 U.S.C. section 360bbb-3(b)(1), unless the authorization is terminated or revoked.  Performed at [facility], [place] [address] [place], [place] [address]           Radiology Studies: ECHOCARDIOGRAM COMPLETE  Result Date: 12/26/2021    ECHOCARDIOGRAM REPORT   Patient Name:   Kathryn Kent [facility] Date of Exam: 12/26/2021 Medical Rec #:  [id number]           Height:       64.0 in Accession #:    [id number]          Weight:       177.6 lb Date of Birth:  [birth year]          BSA:          1.860 m Patient Age:    28 years            BP:           125/72 mmHg Patient Gender: F                   HR:           81 bpm. Exam Location:  Inpatient Procedure: 2D Echo, Color Doppler and Cardiac Doppler Indications:    I26.02 Pulmonary embolus  History:        Patient has no prior history of Echocardiogram examinations.  Risk Factors:Hypertension, Dyslipidemia and Sleep Apnea.  Sonographer:    Raquel Sarna Senior RDCS Referring Phys: Theola Sequin  Sonographer Comments: VERY poor echo windows due to lung interference, entire study performed from subcostal. IMPRESSIONS  1. Limited study due to very poor echo windows.  2. Left ventricular ejection fraction, by estimation, is 70 to 75%. The left ventricle has hyperdynamic function. Left ventricular endocardial border not optimally defined to evaluate regional wall motion. Left ventricular diastolic parameters are consistent with Grade I diastolic dysfunction (impaired relaxation).  3. Right ventricular systolic function was not well visualized but appears grossly normal on limited views. The right ventricular size is not well visualized. There is normal pulmonary artery systolic pressure. The estimated right ventricular systolic pressure is 43.1 mmHg.  4. The mitral valve is grossly normal. Trivial mitral valve regurgitation. Moderate mitral annular calcification.  5. The aortic valve is tricuspid. There is mild calcification of the aortic valve. There is mild thickening of the aortic valve. Aortic valve regurgitation is not visualized. Aortic valve sclerosis/calcification is present, without any evidence of aortic  stenosis.  6. The inferior vena cava is normal in size with greater than 50% respiratory variability, suggesting right atrial pressure of 3 mmHg. FINDINGS  Left Ventricle: Left ventricular ejection fraction, by estimation, is 70 to 75%. The left ventricle has hyperdynamic function. Left ventricular endocardial border not optimally defined to evaluate regional wall motion. The left ventricular internal cavity size was normal in size. There is no left ventricular hypertrophy. Left ventricular diastolic parameters are consistent with Grade I diastolic dysfunction (impaired relaxation). Right Ventricle: The right ventricular size is not well visualized. Right vetricular wall thickness was not well visualized. Right ventricular systolic function was not well visualized. There is normal pulmonary artery systolic pressure. The tricuspid regurgitant velocity is 2.46 m/s, and with an assumed right atrial pressure of 3 mmHg, the estimated right ventricular systolic pressure is 54.0 mmHg. Left Atrium: Left atrial size was not well visualized. Right Atrium: Right atrial size was not well visualized. Pericardium: There is no evidence of pericardial effusion. Mitral Valve: The mitral valve is grossly normal. Moderate mitral annular calcification. Trivial mitral valve regurgitation. Tricuspid Valve: The tricuspid valve is normal in structure. Tricuspid valve regurgitation is trivial. Aortic Valve: The aortic valve is tricuspid. There is mild calcification of the aortic valve. There is mild thickening of the aortic valve. Aortic valve regurgitation is not visualized. Aortic valve sclerosis/calcification is present, without any evidence of aortic stenosis. Pulmonic Valve: The pulmonic valve was normal in structure. Pulmonic valve regurgitation is trivial. Aorta: The aortic root is normal in size and structure. Venous: The inferior vena cava is normal in size with greater than 50% respiratory variability, suggesting right atrial  pressure of 3 mmHg. IAS/Shunts: The atrial septum is grossly normal.  LEFT VENTRICLE PLAX 2D LVIDd:         3.70 cm   Diastology LVIDs:         2.00 cm   LV e' medial:    6.15 cm/s LV PW:         0.90 cm   LV E/e' medial:  6.1 LV IVS:        0.90 cm   LV e' lateral:   7.09 cm/s LVOT diam:     1.90 cm   LV E/e' lateral: 5.3 LVOT Area:     2.84 cm  RIGHT VENTRICLE RV S prime:     19.00 cm/s TAPSE (M-mode): 2.2 cm LEFT ATRIUM  Index        RIGHT ATRIUM          Index LA diam:      3.30 cm 1.77 cm/m   RA Area:     8.21 cm LA Vol (A4C): 67.6 ml 36.37 ml/m  RA Volume:   13.40 ml 7.20 ml/m   AORTA Ao Root diam: 3.10 cm MITRAL VALVE               TRICUSPID VALVE MV Area (PHT): 2.48 cm    TR Peak grad:   24.2 mmHg MV Decel Time: 306 msec    TR Vmax:        246.00 cm/s MV E velocity: 37.70 cm/s MV A velocity: 82.30 cm/s  SHUNTS MV E/A ratio:  0.46        Systemic Diam: 1.90 cm Gwyndolyn Kaufman MD Electronically signed by Gwyndolyn Kaufman MD Signature Date/Time: 12/26/2021/1:55:41 PM    Final         Scheduled Meds:  Chlorhexidine Gluconate Cloth  6 each Topical Daily   ezetimibe  10 mg Oral Daily   montelukast  10 mg Oral QHS   senna-docusate  2 tablet Oral BID   simvastatin  20 mg Oral q1800   Continuous Infusions:  heparin 1,050 Units/hr (12/27/21 1140)     LOS: 1 day    Time spent: 41 minutes spent on chart review, discussion with nursing staff, consultants, updating family and interview/physical exam; more than 50% of that time was spent in counseling and/or coordination of care.    Ronte Parker J [place] DO [facility] Available via Epic secure chat 7am-7pm After these hours, please refer to coverage provider listed on [email or web address] 12/27/2021, 4:37 PM

## 2021-12-27 NOTE — Progress Notes (Addendum)
ANTICOAGULATION CONSULT NOTE - Follow Up Consult  Pharmacy Consult for Heparin Indication: acute pulmonary emboli/ Acute DVT  Allergies  Allergen Reactions   Lipitor [Atorvastatin]     Stomach pains, heart attack sx's    Patient Measurements: Height: '5\' 4"'$  (162.6 cm) Weight: 80.6 kg (177 lb 9.6 oz) IBW/kg (Calculated) : 54.7 Heparin Dosing Weight: 72 kg  Vital Signs: Temp: 98.9 F (37.2 C) (07/05 0409) Temp Source: Oral (07/05 0409) BP: 137/84 (07/05 0409) Pulse Rate: 85 (07/05 0409)  Labs: Recent Labs    12/25/21 1001 12/25/21 1134 12/25/21 1335 12/25/21 2200 12/26/21 0511 12/26/21 0737 12/27/21 0507  HGB 11.8* 11.7*  --   --  11.2*  --  10.5*  HCT 35.5* 35.9*  --   --  35.1*  --  32.6*  PLT 152 148*  --   --  141*  --  132*  HEPARINUNFRC  --   --   --    < > 0.87* 0.46 0.37  CREATININE 0.61 0.50  --   --  0.63  --   --   TROPONINIHS  --  22* 23*  --   --   --   --    < > = values in this interval not displayed.    Estimated Creatinine Clearance: 54.8 mL/min (by C-G formula based on SCr of 0.63 mg/dL).   Medications:  - No PTA anticoagulants  Assessment: Patient is an 36 YOAAF w/ hx of pancreatic cancer on chemo who presented from her oncologist's office w/ sudden onset SOB along w/ fatigue and malaise on 12/25/21 for PE rule out. RLE swelling noted on arrival. A Chest CT on 12/25/21 noted PE w/ RHS.. Doppler positive for extensive bilateral DVT.The pharmacy was consulted for heparin dosing for VTE treatment.  Today, 12/27/21, - Last heparin level on 12/27/21 therapeutic at 0.37 - Platelets decreased from 141 K/uL to 132 K/uL overnight - Hgb decreased from 11.2 g/dL to 10.5 g/dL overnight - No signs of bleeding    Goal of Therapy:  Heparin level 0.3-0.7 units/ml Monitor platelets by anticoagulation protocol: Yes   Plan:  - Continue heparin at 1050 units/hr - Continue to monitor, daily CBC as well as heparin levels  Barnet Pall 12/27/2021,7:40 AM

## 2021-12-28 DIAGNOSIS — I82403 Acute embolism and thrombosis of unspecified deep veins of lower extremity, bilateral: Secondary | ICD-10-CM | POA: Diagnosis not present

## 2021-12-28 DIAGNOSIS — I2699 Other pulmonary embolism without acute cor pulmonale: Secondary | ICD-10-CM | POA: Diagnosis not present

## 2021-12-28 DIAGNOSIS — I10 Essential (primary) hypertension: Secondary | ICD-10-CM | POA: Diagnosis not present

## 2021-12-28 DIAGNOSIS — C252 Malignant neoplasm of tail of pancreas: Secondary | ICD-10-CM

## 2021-12-28 DIAGNOSIS — I2510 Atherosclerotic heart disease of native coronary artery without angina pectoris: Secondary | ICD-10-CM | POA: Diagnosis not present

## 2021-12-28 DIAGNOSIS — Z95828 Presence of other vascular implants and grafts: Secondary | ICD-10-CM

## 2021-12-28 LAB — HEPARIN LEVEL (UNFRACTIONATED): Heparin Unfractionated: 0.41 IU/mL (ref 0.30–0.70)

## 2021-12-28 LAB — CBC
HCT: 33.5 % — ABNORMAL LOW (ref 36.0–46.0)
Hemoglobin: 10.7 g/dL — ABNORMAL LOW (ref 12.0–15.0)
MCH: 30.1 pg (ref 26.0–34.0)
MCHC: 31.9 g/dL (ref 30.0–36.0)
MCV: 94.1 fL (ref 80.0–100.0)
Platelets: 146 10*3/uL — ABNORMAL LOW (ref 150–400)
RBC: 3.56 MIL/uL — ABNORMAL LOW (ref 3.87–5.11)
RDW: 15.8 % — ABNORMAL HIGH (ref 11.5–15.5)
WBC: 5.2 10*3/uL (ref 4.0–10.5)
nRBC: 0 % (ref 0.0–0.2)

## 2021-12-28 MED ORDER — RIVAROXABAN 20 MG PO TABS: 20.0000 mg | ORAL_TABLET | Freq: Every day | ORAL | Status: DC

## 2021-12-28 MED ORDER — RIVAROXABAN 15 MG PO TABS
15.0000 mg | ORAL_TABLET | Freq: Two times a day (BID) | ORAL | Status: DC
  Administered 2021-12-28 – 2021-12-30 (×5): 15 mg via ORAL
  Filled 2021-12-28 (×5): qty 1

## 2021-12-28 NOTE — Progress Notes (Signed)
Pt refused cpap tonight, because of vomiting and upset stomach.

## 2021-12-28 NOTE — Evaluation (Signed)
Occupational Therapy Evaluation Patient Details Name: Kathryn Kent MRN: [id number] DOB: Apr 18, 1936 Today's Date: 12/28/2021   History of Present Illness Kathryn Kent is a 86 y.o. female with past medical history significant for HTN, CAD, anemia, heart murmur, TKA,  pancreatic cancer on chemotherapy who was sent over from the cancer center to [facility] ED 12/25/21  for progressive shortness of breath. Positive for bilateral PE with heart strain, Bileral LE  DVT's   Clinical Impression   Kathryn Kent is an 86 year old woman admitted to hospital with above medical history. On evaluation she presents with generalized weakness and decreased activity tolerance and needing walker when she typically uses a cane. She is min guard for ambulation and ADLs. She is able to grossly perform ADLs in small room setup but doesn't exhibit the endurance needed to perform home tasks. She lives alone with minimal assistance - mostly for rides to the doctor. She reports her legs have been giving out before she can reach the kitchen. Patient will benefit from skilled OT services while in hospital to improve deficits and learn compensatory strategies as needed in order to maximize functional abilities and physical strength prior to return home.       Recommendations for follow up therapy are one component of a multi-disciplinary discharge planning process, led by the attending physician.  Recommendations may be updated based on patient status, additional functional criteria and insurance authorization.   Follow Up Recommendations  Skilled nursing-short term rehab (<3 hours/day)    Assistance Recommended at Discharge Frequent or constant Supervision/Assistance  Patient can return home with the following A little help with walking and/or transfers;A little help with bathing/dressing/bathroom;Assistance with cooking/housework;Help with stairs or ramp for entrance    Functional Status Assessment  Patient has had  a recent decline in their functional status and demonstrates the ability to make significant improvements in function in a reasonable and predictable amount of time.  Equipment Recommendations  None recommended by OT    Recommendations for Other Services       Precautions / Restrictions Precautions Precautions: Fall Precaution Comments: "my legs give out without warning" Restrictions Weight Bearing Restrictions: No      Mobility Bed Mobility Overal bed mobility: Modified Independent                  Transfers Overall transfer level: Needs assistance Equipment used: Rolling walker (2 wheels) Transfers: Sit to/from Stand Sit to Stand: Min guard           General transfer comment: Patient walking out of bathroom with CNA when therapist entered room. Patinet able to ambulate short distance in hall before turning around.      Balance Overall balance assessment: Mild deficits observed, not formally tested                                         ADL either performed or assessed with clinical judgement   ADL Overall ADL's : Needs assistance/impaired Eating/Feeding: Independent   Grooming: Min guard;Standing   Upper Body Bathing: Set up;Sitting   Lower Body Bathing: Min guard;Sit to/from stand   Upper Body Dressing : Set up;Sitting   Lower Body Dressing: Sit to/from stand;Min guard   Toilet Transfer: Min guard;Rolling walker (2 wheels);Regular Toilet;Grab bars   Toileting- Clothing Manipulation and Hygiene: Min guard;Sit to/from stand       Functional mobility during  ADLs: Min guard;Rolling walker (2 wheels)       Vision Patient Visual Report: No change from baseline       Perception     Praxis      Pertinent Vitals/Pain Pain Assessment Pain Assessment: No/denies pain     Hand Dominance Right   Extremity/Trunk Assessment Upper Extremity Assessment Upper Extremity Assessment: Generalized weakness   Lower Extremity  Assessment Lower Extremity Assessment: Generalized weakness   Cervical / Trunk Assessment Cervical / Trunk Assessment: Normal   Communication Communication Communication: No difficulties   Cognition Arousal/Alertness: Awake/alert Behavior During Therapy: WFL for tasks assessed/performed Overall Cognitive Status: Within Functional Limits for tasks assessed                                 General Comments: expresses concern that she has no family to assist her at DC except for transportation,     General Comments       Exercises     Shoulder Instructions      Home Living Family/patient expects to be discharged to:: Private residence Living Arrangements: Alone   Type of Home: House Home Access: Stairs to enter Technical brewer of Steps: 3 Entrance Stairs-Rails: Right Home Layout: One level     Bathroom Shower/Tub: Tub/shower unit         Home Equipment: Conservation officer, nature (2 wheels)   Additional Comments: neices assist with transportation, noone able to assist 24/7      Prior Functioning/Environment Prior Level of Function : Independent/Modified Independent                        OT Problem List: Decreased activity tolerance;Impaired balance (sitting and/or standing);Decreased strength;Decreased knowledge of use of DME or AE      OT Treatment/Interventions: Self-care/ADL training;Therapeutic exercise;DME and/or AE instruction;Therapeutic activities;Balance training;Patient/family education    OT Goals(Current goals can be found in the care plan section) Acute Rehab OT Goals Patient Stated Goal: to get stronger OT Goal Formulation: With patient Time For Goal Achievement: 01/11/22 Potential to Achieve Goals: Good  OT Frequency: Min 2X/week    Co-evaluation              AM-PAC OT "6 Clicks" Daily Activity     Outcome Measure Help from another person eating meals?: None Help from another person taking care of personal  grooming?: A Little Help from another person toileting, which includes using toliet, bedpan, or urinal?: A Little Help from another person bathing (including washing, rinsing, drying)?: A Little Help from another person to put on and taking off regular upper body clothing?: A Little Help from another person to put on and taking off regular lower body clothing?: A Little 6 Click Score: 19   End of Session Equipment Utilized During Treatment: Rolling walker (2 wheels) Nurse Communication: Mobility status  Activity Tolerance: Patient tolerated treatment well Patient left: in bed;with call bell/phone within reach;with bed alarm set  OT Visit Diagnosis: Muscle weakness (generalized) (M62.81)                Time: 4010-2725 OT Time Calculation (min): 11 min Charges:  OT General Charges $OT Visit: 1 Visit OT Evaluation $OT Eval Low Complexity: 1 Low  Delisa Finck, OTR/L [place]  Office [phone] Pager: [phone]   WILEEN DUNCANSON 12/28/2021, 9:38 AM

## 2021-12-28 NOTE — Evaluation (Signed)
Physical Therapy Evaluation Patient Details Name: Kathryn Kent MRN: [id number] DOB: 09-04-1935 Today's Date: 12/28/2021  History of Present Illness  Kathryn Kent is a 86 y.o. female with past medical history significant for HTN, CAD, anemia, heart murmur, TKA,  pancreatic cancer on chemotherapy who was sent over from the cancer center to [facility] ED 12/25/21  for progressive shortness of breath. Positive for bilateral PE with heart strain, Bileral LE  DVT's  Clinical Impression  The patient is resting in bed, agreeable to ambulation. Patient ambulated x 50' on RA. SPO2 remained >92%.(95% at rest) Patient  lives alone with limited support from nieces. Patient may benefit from post acute SNF for rehab. Pt admitted with above diagnosis.  Pt currently with functional limitations due to the deficits listed below (see PT Problem List). Pt will benefit from skilled PT to increase their independence and safety with mobility to allow discharge to the venue listed below.         Recommendations for follow up therapy are one component of a multi-disciplinary discharge planning process, led by the attending physician.  Recommendations may be updated based on patient status, additional functional criteria and insurance authorization.  Follow Up Recommendations Skilled nursing-short term rehab (<3 hours/day) Can patient physically be transported by private vehicle: Yes    Assistance Recommended at Discharge Frequent or constant Supervision/Assistance  Patient can return home with the following  A little help with walking and/or transfers;Assistance with cooking/housework;Assist for transportation;A little help with bathing/dressing/bathroom;Help with stairs or ramp for entrance    Equipment Recommendations None recommended by PT  Recommendations for Other Services       Functional Status Assessment Patient has had a recent decline in their functional status and demonstrates the ability to make  significant improvements in function in a reasonable and predictable amount of time.     Precautions / Restrictions Precautions Precautions: Fall Precaution Comments: "my legs give out without waring"      Mobility  Bed Mobility Overal bed mobility: Modified Independent                  Transfers Overall transfer level: Needs assistance Equipment used: Rolling walker (2 wheels) Transfers: Sit to/from Stand Sit to Stand: Min guard           General transfer comment: cues for safety as patient  walked away from RW  and turned around prior to sitting to recliner.    Ambulation/Gait Ambulation/Gait assistance: Min assist Gait Distance (Feet): 50 Feet Assistive device: Rolling walker (2 wheels) Gait Pattern/deviations: Step-to pattern Gait velocity: decr     General Gait Details: slow speed, cues for safe turning, tended to swing RW around then turn feet.  Stairs            Wheelchair Mobility    Modified Rankin (Stroke Patients Only)       Balance Overall balance assessment: Mild deficits observed, not formally tested                                           Pertinent Vitals/Pain Pain Assessment Pain Assessment: No/denies pain    Home Living Family/patient expects to be discharged to:: Private residence Living Arrangements: Alone   Type of Home: House Home Access: Stairs to enter Entrance Stairs-Rails: Right Entrance Stairs-Number of Steps: 3   Home Layout: One level Home Equipment: Conservation officer, nature (2 wheels)  Additional Comments: neices assist with transportation, noone able to assist 24/7    Prior Function Prior Level of Function : Independent/Modified Independent                     Hand Dominance   Dominant Hand: Right    Extremity/Trunk Assessment   Upper Extremity Assessment Upper Extremity Assessment: Overall WFL for tasks assessed    Lower Extremity Assessment Lower Extremity Assessment:  Generalized weakness    Cervical / Trunk Assessment Cervical / Trunk Assessment: Normal  Communication   Communication: No difficulties  Cognition Arousal/Alertness: Awake/alert Behavior During Therapy: WFL for tasks assessed/performed, Flat affect Overall Cognitive Status: Within Functional Limits for tasks assessed                                 General Comments: expresses concern that she has no family to assist her at DC except for transportation,        General Comments      Exercises     Assessment/Plan    PT Assessment Patient needs continued PT services  PT Problem List Decreased strength;Decreased safety awareness;Decreased mobility;Decreased activity tolerance       PT Treatment Interventions DME instruction;Therapeutic activities;Gait training;Therapeutic exercise;Patient/family education;Functional mobility training    PT Goals (Current goals can be found in the Care Plan section)  Acute Rehab PT Goals Patient Stated Goal: to get stronger and take care of self PT Goal Formulation: With patient Time For Goal Achievement: 01/11/22 Potential to Achieve Goals: Good    Frequency Min 2X/week     Co-evaluation               AM-PAC PT "6 Clicks" Mobility  Outcome Measure Help needed turning from your back to your side while in a flat bed without using bedrails?: None Help needed moving from lying on your back to sitting on the side of a flat bed without using bedrails?: None Help needed moving to and from a bed to a chair (including a wheelchair)?: A Little Help needed standing up from a chair using your arms (e.g., wheelchair or bedside chair)?: A Little Help needed to walk in hospital room?: A Little Help needed climbing 3-5 steps with a railing? : A Lot 6 Click Score: 19    End of Session Equipment Utilized During Treatment: Gait belt Activity Tolerance: Patient tolerated treatment well Patient left: in chair;with call bell/phone  within reach;with family/visitor present Nurse Communication: Mobility status PT Visit Diagnosis: Unsteadiness on feet (R26.81)    Time: 4098-1191 PT Time Calculation (min) (ACUTE ONLY): 20 min   Charges:   PT Evaluation $PT Eval Low Complexity: [place] Office [phone] Weekend [phone]   Claretha Cooper 12/28/2021, 9:16 AM

## 2021-12-28 NOTE — Progress Notes (Signed)
ANTICOAGULATION CONSULT NOTE - Follow Up Consult  Pharmacy Consult for heparin --> rivaroxaban Indication: Acute pulmonary emboli/Acute DVT  Allergies  Allergen Reactions   Lipitor [Atorvastatin]     Stomach pains, heart attack sx's    Patient Measurements: Height: '5\' 4"'$  (162.6 cm) Weight: 80.5 kg (177 lb 7.5 oz) IBW/kg (Calculated) : 54.7 Heparin Dosing Weight: 72 kg  Vital Signs: Temp: 98.7 F (37.1 C) (07/06 0543) Temp Source: Oral (07/06 0543) BP: 125/71 (07/06 0543) Pulse Rate: 80 (07/06 0543)  Labs: Recent Labs    12/25/21 1001 12/25/21 1134 12/25/21 1335 12/25/21 2200 12/26/21 0511 12/26/21 0737 12/27/21 0507 12/28/21 0517  HGB 11.8* 11.7*  --   --  11.2*  --  10.5* 10.7*  HCT 35.5* 35.9*  --   --  35.1*  --  32.6* 33.5*  PLT 152 148*  --   --  141*  --  132* 146*  HEPARINUNFRC  --   --   --    < > 0.87* 0.46 0.37 0.41  CREATININE 0.61 0.50  --   --  0.63  --   --   --   TROPONINIHS  --  22* 23*  --   --   --   --   --    < > = values in this interval not displayed.    Estimated Creatinine Clearance: 54.7 mL/min (by C-G formula based on SCr of 0.63 mg/dL).   Medications:  - No PTA Anticoagulants  Assessment: Patient is an 16 YOAAF w/ hx of pancreatic cancer on chemo who presented from her oncologist's office w/ sudden onset SOB along w/ fatigue and malaise on 12/25/21 for PE rule out. RLE swelling noted on arrival. A Chest CT on 12/25/21 noted PE w/ RHS. Doppler positive for extensive bilateral DVT. Patient was placed on heparin for PE/DVT. The pharmacy was consulted for transition from heparin to rivaroxaban.  Today, 12/28/21 - Last heparin level on 12/28/21 0.41, therapeutic - Currently on 1050 units/hr of heparin  - Patient has shown no signs of bleeding per RN - Most recent LFT's show no hepatic dysfunction - CrCl >=15  Goal of Therapy: Monitor platelets by anticoagulation protocol: Yes   Plan:  - Initiate VTE treatment dose of rivaroxaban 15 mg BID  PO for 21 days followed by 20 mg daily thereafter - D/c heparin at time of rivaroxaban administration - D/c heparin levels -Monitor for signs of bleeding - Pharmacy to provide education prior to discharge    Barnet Pall 12/28/2021,7:17 AM

## 2021-12-28 NOTE — Discharge Instructions (Addendum)
Information on my medicine - XARELTO (rivaroxaban)  This medication education was reviewed with me or my healthcare representative as part of my discharge preparation.  The pharmacist that spoke with me during my hospital stay was:  Barnet Pall, Student-PharmD  WHY WAS Alveda Reasons PRESCRIBED FOR YOU? Xarelto was prescribed to treat blood clots that may have been found in the veins of your legs (deep vein thrombosis) or in your lungs (pulmonary embolism) and to reduce the risk of them occurring again.  What do you need to know about Xarelto? The starting dose is one 15 mg tablet taken TWICE daily with food for the FIRST 21 DAYS then on (enter date)  01/18/22  the dose is changed to one 20 mg tablet taken ONCE A DAY with your evening meal.  DO NOT stop taking Xarelto without talking to the health care provider who prescribed the medication.  Refill your prescription for 20 mg tablets before you run out.  After discharge, you should have regular check-up appointments with your healthcare provider that is prescribing your Xarelto.  In the future your dose may need to be changed if your kidney function changes by a significant amount.  What do you do if you miss a dose? If you are taking Xarelto TWICE DAILY and you miss a dose, take it as soon as you remember. You may take two 15 mg tablets (total 30 mg) at the same time then resume your regularly scheduled 15 mg twice daily the next day.  If you are taking Xarelto ONCE DAILY and you miss a dose, take it as soon as you remember on the same day then continue your regularly scheduled once daily regimen the next day. Do not take two doses of Xarelto at the same time.   Important Safety Information Xarelto is a blood thinner medicine that can cause bleeding. You should call your healthcare provider right away if you experience any of the following: Bleeding from an injury or your nose that does not stop. Unusual colored urine (red or dark brown)  or unusual colored stools (red or black). Unusual bruising for unknown reasons. A serious fall or if you hit your head (even if there is no bleeding).  Some medicines may interact with Xarelto and might increase your risk of bleeding while on Xarelto. To help avoid this, consult your healthcare provider or pharmacist prior to using any new prescription or non-prescription medications, including herbals, vitamins, non-steroidal anti-inflammatory drugs (NSAIDs) and supplements.  This website has more information on Xarelto: [email or web address]

## 2021-12-28 NOTE — TOC Initial Note (Signed)
Transition of Care [facility]) - Initial/Assessment Note    Patient Details  Name: Kathryn Kent MRN: [id number] Date of Birth: Nov 12, 1935  [facility] CM/SW Contact:    Lynnell Catalan, RN Phone Number: 12/28/2021, 3:09 PM  Clinical Narrative:                 Spoke with pt at bedside for dc planning. Physical therapy recommendations gone over with pt. She states she would like to go to short term rehab to get stronger before going home. Permission received to fax out FL2 to area SNFs. TOC will continue to follow along.  Expected Discharge Plan: Skilled Nursing Facility Barriers to Discharge: Continued Medical Work up   Patient Goals and CMS Choice Patient states their goals for this hospitalization and ongoing recovery are:: Rehab      Expected Discharge Plan and Services Expected Discharge Plan: [place]   Discharge Planning Services: CM Consult   Living arrangements for the past 2 months: Single Family Home                   Prior Living Arrangements/Services Living arrangements for the past 2 months: Single Family Home Lives with:: Self          Need for Family Participation in Patient Care: Yes (Comment) Care giver support system in place?: Yes (comment)   Criminal Activity/Legal Involvement Pertinent to Current Situation/Hospitalization: No - Comment as needed  Activities of Daily Living Home Assistive Devices/Equipment: Walker (specify type) ADL Screening (condition at time of admission) Patient's cognitive ability adequate to safely complete daily activities?: Yes Is the patient deaf or have difficulty hearing?: No Does the patient have difficulty seeing, even when wearing glasses/contacts?: No Does the patient have difficulty concentrating, remembering, or making decisions?: No Patient able to express need for assistance with ADLs?: Yes Does the patient have difficulty dressing or bathing?: No Independently performs ADLs?: Yes  (appropriate for developmental age) Does the patient have difficulty walking or climbing stairs?: No Weakness of Legs: Both Weakness of Arms/Hands: None  Emotional Assessment Appearance:: Appears stated age Attitude/Demeanor/Rapport: Gracious Affect (typically observed): Calm Orientation: : Oriented to Self, Oriented to Place, Oriented to  Time, Oriented to Situation Alcohol / Substance Use: Not Applicable Psych Involvement: No (comment)  Admission diagnosis:  Acute pulmonary embolism (HCC) [I26.99] Acute deep vein thrombosis (DVT) of proximal vein of right lower extremity (HCC) [I82.4Y1] Acute pulmonary embolism with acute cor pulmonale, unspecified pulmonary embolism type ([place]) [I26.09] Pulmonary embolism (HCC) [I26.99] Patient Active Problem List   Diagnosis Date Noted   DVT (deep venous thrombosis) ([place]) 12/27/2021   Pulmonary embolism ([place]) 12/26/2021   Acute pulmonary embolism ([place]) 12/25/2021   Acute pulmonary embolism with acute cor pulmonale ([place])    Genetic testing 10/03/2021   Port-A-Cath in place 10/02/2021   Pancreatic cancer ([place]) 09/15/2021   Pancreatic mass 09/04/2021   Postmenopausal bleeding 12/30/2018   History of total knee arthroplasty, right 02/04/2018   Heart murmur 11/28/2017   Osteoarthritis of knee 11/28/2017   Intramural leiomyoma of uterus 09/12/2014   Sebaceous cyst of labia 09/12/2014   Essential hypertension 05/28/2013   Hyperlipidemia 05/28/2013   Coronary arteriosclerosis 05/28/2013   PCP:  Crist Infante, MD Pharmacy:   [facility] [place], [place] - [address] AT [facility] [address] [place] [address] Phone: [phone] Fax: [place], [place] - [address] [facility] [address] [place] [address] Phone: [phone] Fax: [phone]     Social Determinants of Health  (SDOH) Interventions    Readmission Risk Interventions     No data to display

## 2021-12-28 NOTE — Progress Notes (Addendum)
PROGRESS NOTE    Kathryn Kent  KWI:[id number] DOB: [birth year] DOA: 12/25/2021 PCP: Crist Infante, MD    Brief Narrative:   Kathryn Kent is a 86 y.o. female with past medical history significant for pancreatic cancer on chemotherapy who was sent over from the cancer center to [facility] ED for progressive shortness of breath given concern for possible pulmonary embolism.  Patient reported that she developed sudden onset shortness of breath earlier this morning, and she knew she had an appointment with her oncology providers.  Patient also reported difficulty walking.  Denied chest pain, no hemoptysis or palpitations.  In the ED, patient was noted to have a SPO2 of 84% on room air and placed on 4 L nasal cannula, RR 28, temperature 98.1 F, BP 136/80.  WBC 5.9, hemoglobin 11.8, platelets 152.  Sodium 138, potassium 3.6, chloride 103, CO2 28, glucose 150.  BUN 21, creatinine 0.61.  AST 17, ALT 22, total bilirubin 0.7.  COVID-19 PCR negative.  Influenza A/B PCR negative.  Chest x-ray with increasing opacity right lower chest with no nodules visual thickening, suspected right-sided small pleural effusion.  CT angiogram chest PE study with positive bilateral pulmonary emboli with CT evidence of right heart strain, slight increased right pleural effusion, stable pancreatic tail mass with marginal progression of pulmonary nodules.  Patient was started on IV heparin drip.  TRH consulted for further evaluation and management of acute hypoxic respiratory failure in the setting of pulmonary embolism.  Assessment & Plan:   Pulmonary embolism, bilateral Bilateral lower extremity DVT Patient presenting with acute shortness of breath.  CT angiogram chest with positive bilateral pulm emboli with CT evidence of right heart strain.  Bilateral duplex ultrasound lower extremities positive for acute DVT right lower extremity and age-indeterminate DVT left lower extremity.  TTE with LVEF 2426%, grade 1 diastolic  dysfunction, RV systolic function not well visualized but appears grossly normal, trivial MR, IVC normal in size.  Titrated off of supplemental oxygen, no desaturation on ambulatory O2 screen 7/5. --Transition heparin drip to Xarelto today  Pancreatic cancer Follows with medical oncology outpatient, Dr. Burr Medico. Currently on chemotherapy.  Continue outpatient follow-up.  CAD Stable, denies chest pain. --Continue statin  OSA: Continue nocturnal CPAP  Essential hypertension On amlodipine 10 mg p.o. daily, chlorthalidone 12.5 mg p.o. daily, irbesartan 300 mg p.o. daily outpatient. --BP currently 125/71 off of antihypertensives --Continue to hold home antihypertensives --Monitor BP closely  Asthma Stable, now titrated off of supple oxygen as above.  No wheezing. --Singulair 10 mg p.o. nightly  Hyperlipidemia --Simvastatin 20 g p.o. daily --Zetia 10 mg p.o. daily  Weakness/debility/gait disturbance: Evaluated by PT/OT with recommendation for SNF placement.  TOC consulted for placement. --Continue therapy efforts while inpatient, medically stable for discharge when SNF bed available.  DVT prophylaxis: Place TED hose Start: 12/25/21 1725 Rivaroxaban (XARELTO) tablet 15 mg  rivaroxaban (XARELTO) tablet 20 mg    Code Status: DNR Family Communication: Updated close friend present at bedside this morning  Disposition Plan:  Level of care: Med-Surg Status is: Inpatient Remains inpatient appropriate because: Transitioning IV heparin drip to Xarelto, awaiting SNF placement, medically stable for discharge once SNF bed available    Consultants:  Pulmonology/critical care medicine, Dr. Verlee Monte  Procedures:  TTE Bilateral vascular duplex ultrasound lower extremities  Antimicrobials:  None   Subjective: Patient seen examined bedside, resting comfortably.  Sitting in bedside chair, close friend present.  Transitioning heparin drip to Xarelto today.  Evaluated by PT/OT with recommendation  of SNF placement; discussed with TOC for placement this morning.  No other questions or concerns at this time. Patient denies headache, no dizziness, no chest pain, no palpitations, no current shortness of breath, no abdominal pain, no focal weakness, no fever/chills/night sweats, no nausea/vomiting/diarrhea, no fatigue, no paresthesias.  No acute events overnight per nursing staff.  Objective: Vitals:   12/27/21 0409 12/27/21 1354 12/27/21 2204 12/28/21 0543  BP: 137/84 123/80 136/87 125/71  Pulse: 85 87 83 80  Resp: '20 18 16 14  '$ Temp: 98.9 F (37.2 C) 98.9 F (37.2 C) 98.4 F (36.9 C) 98.7 F (37.1 C)  TempSrc: Oral Oral Oral Oral  SpO2: 98% 90% 96% 95%  Weight:    80.5 kg  Height:        Intake/Output Summary (Last 24 hours) at 12/28/2021 1249 Last data filed at 12/28/2021 1000 Gross per 24 hour  Intake 480 ml  Output 2 ml  Net 478 ml   Filed Weights   12/25/21 1124 12/28/21 0543  Weight: 80.6 kg 80.5 kg    Examination:  Physical Exam: GEN: NAD, alert and oriented x 3, wd/wn HEENT: NCAT, PERRL, EOMI, sclera clear, MMM PULM: CTAB w/o wheezes/crackles, normal respiratory effort, on room air CV: RRR w/o M/G/R GI: abd soft, NTND, NABS, no R/G/M MSK: no peripheral edema, muscle strength globally intact 5/5 bilateral upper/lower extremities NEURO: CN II-XII intact, no focal deficits, sensation to light touch intact PSYCH: normal mood/affect Integumentary: dry/intact, no rashes or wounds    Data Reviewed: I have personally reviewed following labs and imaging studies  CBC: Recent Labs  Lab 12/25/21 1001 12/25/21 1134 12/26/21 0511 12/27/21 0507 12/28/21 0517  WBC 5.9 6.1 5.7 4.9 5.2  NEUTROABS 4.2 4.4  --   --   --   HGB 11.8* 11.7* 11.2* 10.5* 10.7*  HCT 35.5* 35.9* 35.1* 32.6* 33.5*  MCV 90.6 94.5 94.6 94.2 94.1  PLT 152 148* 141* 132* 144*   Basic Metabolic Panel: Recent Labs  Lab 12/25/21 1001 12/25/21 1134 12/26/21 0511  NA 138 139 140  K 3.6 3.7 4.3   CL 103 105 104  CO2 '28 27 27  '$ GLUCOSE 150* 139* 143*  BUN '21 23 20  '$ CREATININE 0.61 0.50 0.63  CALCIUM 10.0 9.6 9.5   GFR: Estimated Creatinine Clearance: 54.7 mL/min (by C-G formula based on SCr of 0.63 mg/dL). Liver Function Tests: Recent Labs  Lab 12/25/21 1001 12/25/21 1134 12/26/21 0511  AST '17 18 18  '$ ALT '22 25 23  '$ ALKPHOS 85 76 68  BILITOT 0.7 0.8 0.8  PROT 7.8 7.5 7.3  ALBUMIN 3.7 3.5 3.2*   No results for input(s): "LIPASE", "AMYLASE" in the last 168 hours. No results for input(s): "AMMONIA" in the last 168 hours. Coagulation Profile: No results for input(s): "INR", "PROTIME" in the last 168 hours. Cardiac Enzymes: No results for input(s): "CKTOTAL", "CKMB", "CKMBINDEX", "TROPONINI" in the last 168 hours. BNP (last 3 results) No results for input(s): "PROBNP" in the last 8760 hours. HbA1C: No results for input(s): "HGBA1C" in the last 72 hours. CBG: No results for input(s): "GLUCAP" in the last 168 hours. Lipid Profile: No results for input(s): "CHOL", "HDL", "LDLCALC", "TRIG", "CHOLHDL", "LDLDIRECT" in the last 72 hours. Thyroid Function Tests: No results for input(s): "TSH", "T4TOTAL", "FREET4", "T3FREE", "THYROIDAB" in the last 72 hours. Anemia Panel: No results for input(s): "VITAMINB12", "FOLATE", "FERRITIN", "TIBC", "IRON", "RETICCTPCT" in the last 72 hours. Sepsis Labs: No results for input(s): "PROCALCITON", "LATICACIDVEN" in the last 168  hours.  Recent Results (from the past 240 hour(s))  Resp Panel by RT-PCR (Flu A&B, Covid) Anterior Nasal Swab     Status: None   Collection Time: 12/25/21 11:34 AM   Specimen: Anterior Nasal Swab  Result Value Ref Range Status   SARS Coronavirus 2 by RT PCR NEGATIVE NEGATIVE Final    Comment: (NOTE) SARS-CoV-2 target nucleic acids are NOT DETECTED.  The SARS-CoV-2 RNA is generally detectable in upper respiratory specimens during the acute phase of infection. The lowest concentration of SARS-CoV-2 viral copies  this assay can detect is 138 copies/mL. A negative result does not preclude SARS-Cov-2 infection and should not be used as the sole basis for treatment or other patient management decisions. A negative result may occur with  improper specimen collection/handling, submission of specimen other than nasopharyngeal swab, presence of viral mutation(s) within the areas targeted by this assay, and inadequate number of viral copies(<138 copies/mL). A negative result must be combined with clinical observations, patient history, and epidemiological information. The expected result is Negative.  Fact Sheet for Patients:  EntrepreneurPulse.com.au  Fact Sheet for Healthcare Providers:  IncredibleEmployment.be  This test is no t yet approved or cleared by the [place] FDA and  has been authorized for detection and/or diagnosis of SARS-CoV-2 by FDA under an Emergency Use Authorization (EUA). This EUA will remain  in effect (meaning this test can be used) for the duration of the COVID-19 declaration under Section 564(b)(1) of the Act, 21 U.S.C.section 360bbb-3(b)(1), unless the authorization is terminated  or revoked sooner.       Influenza A by PCR NEGATIVE NEGATIVE Final   Influenza B by PCR NEGATIVE NEGATIVE Final    Comment: (NOTE) The Xpert Xpress SARS-CoV-2/FLU/RSV plus assay is intended as an aid in the diagnosis of influenza from Nasopharyngeal swab specimens and should not be used as a sole basis for treatment. Nasal washings and aspirates are unacceptable for Xpert Xpress SARS-CoV-2/FLU/RSV testing.  Fact Sheet for Patients: EntrepreneurPulse.com.au  Fact Sheet for Healthcare Providers: IncredibleEmployment.be  This test is not yet approved or cleared by the [place] FDA and has been authorized for detection and/or diagnosis of SARS-CoV-2 by FDA under an Emergency Use Authorization (EUA). This EUA  will remain in effect (meaning this test can be used) for the duration of the COVID-19 declaration under Section 564(b)(1) of the Act, 21 U.S.C. section 360bbb-3(b)(1), unless the authorization is terminated or revoked.  Performed at [facility], [place] [address], [place], [place] [address]          Radiology Studies: ECHOCARDIOGRAM COMPLETE  Result Date: 12/26/2021    ECHOCARDIOGRAM REPORT   Patient Name:   Kathryn Kent [facility] Date of Exam: 12/26/2021 Medical Rec #:  [id number]           Height:       64.0 in Accession #:    [id number]          Weight:       177.6 lb Date of Birth:  [birth year]          BSA:          1.860 m Patient Age:    54 years            BP:           125/72 mmHg Patient Gender: F                   HR:  81 bpm. Exam Location:  Inpatient Procedure: 2D Echo, Color Doppler and Cardiac Doppler Indications:    I26.02 Pulmonary embolus  History:        Patient has no prior history of Echocardiogram examinations.                 Risk Factors:Hypertension, Dyslipidemia and Sleep Apnea.  Sonographer:    Raquel Sarna Senior RDCS Referring Phys: Theola Sequin  Sonographer Comments: VERY poor echo windows due to lung interference, entire study performed from subcostal. IMPRESSIONS  1. Limited study due to very poor echo windows.  2. Left ventricular ejection fraction, by estimation, is 70 to 75%. The left ventricle has hyperdynamic function. Left ventricular endocardial border not optimally defined to evaluate regional wall motion. Left ventricular diastolic parameters are consistent with Grade I diastolic dysfunction (impaired relaxation).  3. Right ventricular systolic function was not well visualized but appears grossly normal on limited views. The right ventricular size is not well visualized. There is normal pulmonary artery systolic pressure. The estimated right ventricular systolic pressure is 62.6 mmHg.  4. The mitral valve is grossly normal. Trivial mitral  valve regurgitation. Moderate mitral annular calcification.  5. The aortic valve is tricuspid. There is mild calcification of the aortic valve. There is mild thickening of the aortic valve. Aortic valve regurgitation is not visualized. Aortic valve sclerosis/calcification is present, without any evidence of aortic stenosis.  6. The inferior vena cava is normal in size with greater than 50% respiratory variability, suggesting right atrial pressure of 3 mmHg. FINDINGS  Left Ventricle: Left ventricular ejection fraction, by estimation, is 70 to 75%. The left ventricle has hyperdynamic function. Left ventricular endocardial border not optimally defined to evaluate regional wall motion. The left ventricular internal cavity size was normal in size. There is no left ventricular hypertrophy. Left ventricular diastolic parameters are consistent with Grade I diastolic dysfunction (impaired relaxation). Right Ventricle: The right ventricular size is not well visualized. Right vetricular wall thickness was not well visualized. Right ventricular systolic function was not well visualized. There is normal pulmonary artery systolic pressure. The tricuspid regurgitant velocity is 2.46 m/s, and with an assumed right atrial pressure of 3 mmHg, the estimated right ventricular systolic pressure is 94.8 mmHg. Left Atrium: Left atrial size was not well visualized. Right Atrium: Right atrial size was not well visualized. Pericardium: There is no evidence of pericardial effusion. Mitral Valve: The mitral valve is grossly normal. Moderate mitral annular calcification. Trivial mitral valve regurgitation. Tricuspid Valve: The tricuspid valve is normal in structure. Tricuspid valve regurgitation is trivial. Aortic Valve: The aortic valve is tricuspid. There is mild calcification of the aortic valve. There is mild thickening of the aortic valve. Aortic valve regurgitation is not visualized. Aortic valve sclerosis/calcification is present,  without any evidence of aortic stenosis. Pulmonic Valve: The pulmonic valve was normal in structure. Pulmonic valve regurgitation is trivial. Aorta: The aortic root is normal in size and structure. Venous: The inferior vena cava is normal in size with greater than 50% respiratory variability, suggesting right atrial pressure of 3 mmHg. IAS/Shunts: The atrial septum is grossly normal.  LEFT VENTRICLE PLAX 2D LVIDd:         3.70 cm   Diastology LVIDs:         2.00 cm   LV e' medial:    6.15 cm/s LV PW:         0.90 cm   LV E/e' medial:  6.1 LV IVS:  0.90 cm   LV e' lateral:   7.09 cm/s LVOT diam:     1.90 cm   LV E/e' lateral: 5.3 LVOT Area:     2.84 cm  RIGHT VENTRICLE RV S prime:     19.00 cm/s TAPSE (M-mode): 2.2 cm LEFT ATRIUM           Index        RIGHT ATRIUM          Index LA diam:      3.30 cm 1.77 cm/m   RA Area:     8.21 cm LA Vol (A4C): 67.6 ml 36.37 ml/m  RA Volume:   13.40 ml 7.20 ml/m   AORTA Ao Root diam: 3.10 cm MITRAL VALVE               TRICUSPID VALVE MV Area (PHT): 2.48 cm    TR Peak grad:   24.2 mmHg MV Decel Time: 306 msec    TR Vmax:        246.00 cm/s MV E velocity: 37.70 cm/s MV A velocity: 82.30 cm/s  SHUNTS MV E/A ratio:  0.46        Systemic Diam: 1.90 cm Gwyndolyn Kaufman MD Electronically signed by Gwyndolyn Kaufman MD Signature Date/Time: 12/26/2021/1:55:41 PM    Final         Scheduled Meds:  Chlorhexidine Gluconate Cloth  6 each Topical Daily   ezetimibe  10 mg Oral Daily   montelukast  10 mg Oral QHS   Rivaroxaban  15 mg Oral BID WC   Followed by   Derrill Memo ON 01/18/2022] rivaroxaban  20 mg Oral Q supper   senna-docusate  2 tablet Oral BID   simvastatin  20 mg Oral q1800   Continuous Infusions:     LOS: 2 days    Time spent: 46 minutes spent on chart review, discussion with nursing staff, consultants, updating family and interview/physical exam; more than 50% of that time was spent in counseling and/or coordination of care.    Rusell Meneely J [place]  DO [facility] Available via Epic secure chat 7am-7pm After these hours, please refer to coverage provider listed on [email or web address] 12/28/2021, 12:49 PM

## 2021-12-28 NOTE — NC FL2 (Signed)
[place] LEVEL OF CARE SCREENING TOOL     IDENTIFICATION  Patient Name: Kathryn Kent Birthdate: Sep 27, 1935 Sex: female Admission Date (Current Location): 12/25/2021  [facility] and [place] Number:  Herbalist and Address:  [facility],  [place], [place]      Provider Number: [id number]  Attending Physician Name and Address:  [place] Donnamarie Poag, DO  Relative Name and Phone Number:       Current Level of Care: Hospital Recommended Level of Care: [place] Prior Approval Number:    Date Approved/Denied:   PASRR Number: [id number]  Discharge Plan: SNF    Current Diagnoses: Patient Active Problem List   Diagnosis Date Noted   DVT (deep venous thrombosis) ([place]) 12/27/2021   Pulmonary embolism ([place]) 12/26/2021   Acute pulmonary embolism ([place]) 12/25/2021   Acute pulmonary embolism with acute cor pulmonale (HCC)    Genetic testing 10/03/2021   Port-A-Cath in place 10/02/2021   Pancreatic cancer ([place]) 09/15/2021   Pancreatic mass 09/04/2021   Postmenopausal bleeding 12/30/2018   History of total knee arthroplasty, right 02/04/2018   Heart murmur 11/28/2017   Osteoarthritis of knee 11/28/2017   Intramural leiomyoma of uterus 09/12/2014   Sebaceous cyst of labia 09/12/2014   Essential hypertension 05/28/2013   Hyperlipidemia 05/28/2013   Coronary arteriosclerosis 05/28/2013    Orientation RESPIRATION BLADDER Height & Weight     Self, Time, Situation, Place  Normal Continent Weight: 80.5 kg Height:  '5\' 4"'$  (162.6 cm)  BEHAVIORAL SYMPTOMS/MOOD NEUROLOGICAL BOWEL NUTRITION STATUS      Continent Diet  AMBULATORY STATUS COMMUNICATION OF NEEDS Skin   Limited Assist Verbally (P) Normal                       Personal Care Assistance Level of Assistance  Bathing, Feeding, Total care, Dressing Bathing Assistance: Limited assistance Feeding assistance: Independent Dressing Assistance: Limited  assistance Total Care Assistance: Limited assistance   Functional Limitations Info  Sight, Hearing, Speech Sight Info: Impaired Hearing Info: Adequate Speech Info: Adequate    SPECIAL CARE FACTORS FREQUENCY  PT (By licensed PT), OT (By licensed OT)     PT Frequency: 5 x weekly OT Frequency: 5 x weekly            Contractures Contractures Info: Not present    Additional Factors Info  Code Status, Allergies Code Status Info: DNR Allergies Info: Lipitor           Current Medications (12/28/2021):  This is the current hospital active medication list Current Facility-Administered Medications  Medication Dose Route Frequency Provider Last Rate Last Admin   acetaminophen (TYLENOL) tablet 650 mg  650 mg Oral Q6H PRN Sueanne Margarita, DO       Or   acetaminophen (TYLENOL) suppository 650 mg  650 mg Rectal Q6H PRN Sueanne Margarita, DO       Chlorhexidine Gluconate Cloth 2 % PADS 6 each  6 each Topical Daily Hosie Poisson, MD   6 each at 12/28/21 0929   ezetimibe (ZETIA) tablet 10 mg  10 mg Oral Daily Sueanne Margarita, DO   10 mg at 12/28/21 0843   montelukast (SINGULAIR) tablet 10 mg  10 mg Oral QHS Sueanne Margarita, DO   10 mg at 12/27/21 2159   ondansetron (ZOFRAN) injection 4 mg  4 mg Intravenous Q6H PRN [place] Eric J, DO   4 mg at 12/28/21 1428   ondansetron (ZOFRAN) tablet 8 mg  8 mg Oral BID PRN Sueanne Margarita, DO   8 mg at 12/25/21 1958   polyethylene glycol (MIRALAX / GLYCOLAX) packet 17 g  17 g Oral Daily PRN Sueanne Margarita, DO   17 g at 12/27/21 0941   Rivaroxaban (XARELTO) tablet 15 mg  15 mg Oral BID WC Barnet Pall, Student-PharmD   15 mg at 12/28/21 0315   Followed by   Derrill Memo ON 01/18/2022] rivaroxaban (XARELTO) tablet 20 mg  20 mg Oral Q supper Barnet Pall, Student-PharmD       senna-docusate (Senokot-S) tablet 2 tablet  2 tablet Oral BID Hosie Poisson, MD   2 tablet at 12/28/21 0843   simvastatin (ZOCOR) tablet 20 mg  20 mg Oral q1800 Sueanne Margarita, DO   20 mg at  12/27/21 1721     Discharge Medications: Please see discharge summary for a list of discharge medications.  Relevant Imaging Results:  Relevant Lab Results:   Additional Information [id number]  Zonia Caplin, Marjie Skiff, RN

## 2021-12-29 ENCOUNTER — Inpatient Hospital Stay (HOSPITAL_COMMUNITY): Payer: Medicare Other

## 2021-12-29 DIAGNOSIS — I82403 Acute embolism and thrombosis of unspecified deep veins of lower extremity, bilateral: Secondary | ICD-10-CM | POA: Diagnosis not present

## 2021-12-29 DIAGNOSIS — I2510 Atherosclerotic heart disease of native coronary artery without angina pectoris: Secondary | ICD-10-CM | POA: Diagnosis not present

## 2021-12-29 DIAGNOSIS — I2699 Other pulmonary embolism without acute cor pulmonale: Secondary | ICD-10-CM | POA: Diagnosis not present

## 2021-12-29 DIAGNOSIS — E782 Mixed hyperlipidemia: Secondary | ICD-10-CM | POA: Diagnosis not present

## 2021-12-29 MED ORDER — BISACODYL 10 MG RE SUPP
10.0000 mg | Freq: Once | RECTAL | Status: AC
  Administered 2021-12-29: 10 mg via RECTAL
  Filled 2021-12-29: qty 1

## 2021-12-29 MED ORDER — PROCHLORPERAZINE EDISYLATE 10 MG/2ML IJ SOLN
10.0000 mg | Freq: Four times a day (QID) | INTRAMUSCULAR | Status: DC | PRN
  Administered 2021-12-29 – 2021-12-30 (×2): 10 mg via INTRAVENOUS
  Filled 2021-12-29 (×2): qty 2

## 2021-12-29 MED ORDER — SENNOSIDES-DOCUSATE SODIUM 8.6-50 MG PO TABS: 1.0000 | ORAL_TABLET | Freq: Two times a day (BID) | ORAL | Status: DC

## 2021-12-29 MED ORDER — POLYETHYLENE GLYCOL 3350 17 G PO PACK
17.0000 g | PACK | Freq: Every day | ORAL | Status: DC
  Administered 2021-12-30: 17 g via ORAL
  Filled 2021-12-29: qty 1

## 2021-12-29 NOTE — TOC Progression Note (Signed)
Transition of Care [facility]) - Progression Note    Patient Details  Name: Kathryn Kent MRN: [id number] Date of Birth: 1935/11/28  Transition of [facility]) CM/SW Contact  Michaeal Davis, Marjie Skiff, RN Phone Number: 12/29/2021, 11:14 AM  Clinical Narrative:    SNF bed offers provided to pt and friend at bedside. [place] was chosen. Insurance Josem Kaufmann was completed with [facility] 7/7-7/11 [phone]. Per MD pt is not medically ready to dc today. Nikki from [employer] they can take her over the weekend.   Expected Discharge Plan: [place] Barriers to Discharge: Continued Medical Work up  Expected Discharge Plan and Services Expected Discharge Plan: [place]   Discharge Planning Services: CM Consult   Living arrangements for the past 2 months: Single Family Home                                       Social Determinants of Health (SDOH) Interventions    Readmission Risk Interventions     No data to display

## 2021-12-29 NOTE — Progress Notes (Signed)
PROGRESS NOTE    Kathryn Kent  AVW:[id number] DOB: 04-08-1936 DOA: 12/25/2021 PCP: Crist Infante, MD    Brief Narrative:   Kathryn Kent is a 86 y.o. female with past medical history significant for pancreatic cancer on chemotherapy who was sent over from the cancer center to [facility] ED for progressive shortness of breath given concern for possible pulmonary embolism.  Patient reported that she developed sudden onset shortness of breath earlier this morning, and she knew she had an appointment with her oncology providers.  Patient also reported difficulty walking.  Denied chest pain, no hemoptysis or palpitations.  In the ED, patient was noted to have a SPO2 of 84% on room air and placed on 4 L nasal cannula, RR 28, temperature 98.1 F, BP 136/80.  WBC 5.9, hemoglobin 11.8, platelets 152.  Sodium 138, potassium 3.6, chloride 103, CO2 28, glucose 150.  BUN 21, creatinine 0.61.  AST 17, ALT 22, total bilirubin 0.7.  COVID-19 PCR negative.  Influenza A/B PCR negative.  Chest x-ray with increasing opacity right lower chest with no nodules visual thickening, suspected right-sided small pleural effusion.  CT angiogram chest PE study with positive bilateral pulmonary emboli with CT evidence of right heart strain, slight increased right pleural effusion, stable pancreatic tail mass with marginal progression of pulmonary nodules.  Patient was started on IV heparin drip.  TRH consulted for further evaluation and management of acute hypoxic respiratory failure in the setting of pulmonary embolism.  Assessment & Plan:   Pulmonary embolism, bilateral Bilateral lower extremity DVT Patient presenting with acute shortness of breath.  CT angiogram chest with positive bilateral pulm emboli with CT evidence of right heart strain.  Bilateral duplex ultrasound lower extremities positive for acute DVT right lower extremity and age-indeterminate DVT left lower extremity.  TTE with LVEF 8295%, grade 1 diastolic  dysfunction, RV systolic function not well visualized but appears grossly normal, trivial MR, IVC normal in size.  Titrated off of supplemental oxygen, no desaturation on ambulatory O2 screen 7/5. --Transition heparin drip to Xarelto today  Pancreatic cancer Follows with medical oncology outpatient, Dr. Burr Medico. Currently on chemotherapy.  Continue outpatient follow-up.  CAD Stable, denies chest pain. --Continue statin  OSA: Continue nocturnal CPAP  Essential hypertension On amlodipine 10 mg p.o. daily, chlorthalidone 12.5 mg p.o. daily, irbesartan 300 mg p.o. daily outpatient. --BP currently 125/71 off of antihypertensives --Continue to hold home antihypertensives --Monitor BP closely  Asthma Stable, now titrated off of supple oxygen as above.  No wheezing. --Singulair 10 mg p.o. nightly  Hyperlipidemia --Simvastatin 20 g p.o. daily --Zetia 10 mg p.o. daily  Constipation: Patient complaining of abdominal pain associate with nausea/vomiting this morning.  KUB notable for moderate stool burden descending colon. --Senokot-S 2 tabs PO BID --MiraLAX daily --Bisacodyl suppository x1 today  Weakness/debility/gait disturbance: Evaluated by PT/OT with recommendation for SNF placement.  TOC consulted for placement. --Continue therapy efforts while inpatient, medically stable for discharge when SNF bed available.  DVT prophylaxis: Place TED hose Start: 12/25/21 1725 Rivaroxaban (XARELTO) tablet 15 mg  rivaroxaban (XARELTO) tablet 20 mg    Code Status: DNR Family Communication: Updated close friend present at bedside this morning  Disposition Plan:  Level of care: Med-Surg Status is: Inpatient Remains inpatient appropriate because: As long as nausea/vomiting and abdominal discomfort resolved, anticipate discharge to [facility] tomorrow    Consultants:  Pulmonology/critical care medicine, Dr. Verlee Monte  Procedures:  TTE Bilateral vascular duplex ultrasound lower  extremities  Antimicrobials:  None  Subjective: Patient seen examined bedside, resting comfortably.  Lying in bed.  Close friend present.  Complaining of some mild lower quadrant abdominal pain with episode of nausea/vomiting this morning.  KUB notable for moderate stool burden descending colon.  Discussed increased bowel regimen and need for increased ambulation today.  Patient has SNF bed available, anticipate hopeful discharge to [facility] tomorrow.  No other questions or concerns at this time. Patient denies headache, no dizziness, no chest pain, no palpitations, no current shortness of breath, no abdominal pain, no focal weakness, no fever/chills/night sweats, no nausea/vomiting/diarrhea, no fatigue, no paresthesias.  No acute events overnight per nursing staff.  Objective: Vitals:   12/28/21 0543 12/28/21 1502 12/28/21 2149 12/29/21 0622  BP: 125/71 130/82 (!) 148/88 140/90  Pulse: 80 85 84 84  Resp: '14 18 14 16  '$ Temp: 98.7 F (37.1 C) 98.4 F (36.9 C) 98.3 F (36.8 C) 98.7 F (37.1 C)  TempSrc: Oral Oral Oral Oral  SpO2: 95% 96% 94% 94%  Weight: 80.5 kg   81.2 kg  Height:        Intake/Output Summary (Last 24 hours) at 12/29/2021 1254 Last data filed at 12/28/2021 1902 Gross per 24 hour  Intake 240 ml  Output 1 ml  Net 239 ml   Filed Weights   12/25/21 1124 12/28/21 0543 12/29/21 0622  Weight: 80.6 kg 80.5 kg 81.2 kg    Examination:  Physical Exam: GEN: NAD, alert and oriented x 3, wd/wn HEENT: NCAT, PERRL, EOMI, sclera clear, MMM PULM: CTAB w/o wheezes/crackles, normal respiratory effort, on room air CV: RRR w/o M/G/R GI: abd soft, NTND, NABS, no R/G/M MSK: no peripheral edema, muscle strength globally intact 5/5 bilateral upper/lower extremities NEURO: CN II-XII intact, no focal deficits, sensation to light touch intact PSYCH: normal mood/affect Integumentary: dry/intact, no rashes or wounds    Data Reviewed: I have personally reviewed following labs and  imaging studies  CBC: Recent Labs  Lab 12/25/21 1001 12/25/21 1134 12/26/21 0511 12/27/21 0507 12/28/21 0517  WBC 5.9 6.1 5.7 4.9 5.2  NEUTROABS 4.2 4.4  --   --   --   HGB 11.8* 11.7* 11.2* 10.5* 10.7*  HCT 35.5* 35.9* 35.1* 32.6* 33.5*  MCV 90.6 94.5 94.6 94.2 94.1  PLT 152 148* 141* 132* 419*   Basic Metabolic Panel: Recent Labs  Lab 12/25/21 1001 12/25/21 1134 12/26/21 0511  NA 138 139 140  K 3.6 3.7 4.3  CL 103 105 104  CO2 '28 27 27  '$ GLUCOSE 150* 139* 143*  BUN '21 23 20  '$ CREATININE 0.61 0.50 0.63  CALCIUM 10.0 9.6 9.5   GFR: Estimated Creatinine Clearance: 54.9 mL/min (by C-G formula based on SCr of 0.63 mg/dL). Liver Function Tests: Recent Labs  Lab 12/25/21 1001 12/25/21 1134 12/26/21 0511  AST '17 18 18  '$ ALT '22 25 23  '$ ALKPHOS 85 76 68  BILITOT 0.7 0.8 0.8  PROT 7.8 7.5 7.3  ALBUMIN 3.7 3.5 3.2*   No results for input(s): "LIPASE", "AMYLASE" in the last 168 hours. No results for input(s): "AMMONIA" in the last 168 hours. Coagulation Profile: No results for input(s): "INR", "PROTIME" in the last 168 hours. Cardiac Enzymes: No results for input(s): "CKTOTAL", "CKMB", "CKMBINDEX", "TROPONINI" in the last 168 hours. BNP (last 3 results) No results for input(s): "PROBNP" in the last 8760 hours. HbA1C: No results for input(s): "HGBA1C" in the last 72 hours. CBG: No results for input(s): "GLUCAP" in the last 168 hours. Lipid Profile: No results  for input(s): "CHOL", "HDL", "LDLCALC", "TRIG", "CHOLHDL", "LDLDIRECT" in the last 72 hours. Thyroid Function Tests: No results for input(s): "TSH", "T4TOTAL", "FREET4", "T3FREE", "THYROIDAB" in the last 72 hours. Anemia Panel: No results for input(s): "VITAMINB12", "FOLATE", "FERRITIN", "TIBC", "IRON", "RETICCTPCT" in the last 72 hours. Sepsis Labs: No results for input(s): "PROCALCITON", "LATICACIDVEN" in the last 168 hours.  Recent Results (from the past 240 hour(s))  Resp Panel by RT-PCR (Flu A&B, Covid)  Anterior Nasal Swab     Status: None   Collection Time: 12/25/21 11:34 AM   Specimen: Anterior Nasal Swab  Result Value Ref Range Status   SARS Coronavirus 2 by RT PCR NEGATIVE NEGATIVE Final    Comment: (NOTE) SARS-CoV-2 target nucleic acids are NOT DETECTED.  The SARS-CoV-2 RNA is generally detectable in upper respiratory specimens during the acute phase of infection. The lowest concentration of SARS-CoV-2 viral copies this assay can detect is 138 copies/mL. A negative result does not preclude SARS-Cov-2 infection and should not be used as the sole basis for treatment or other patient management decisions. A negative result may occur with  improper specimen collection/handling, submission of specimen other than nasopharyngeal swab, presence of viral mutation(s) within the areas targeted by this assay, and inadequate number of viral copies(<138 copies/mL). A negative result must be combined with clinical observations, patient history, and epidemiological information. The expected result is Negative.  Fact Sheet for Patients:  EntrepreneurPulse.com.au  Fact Sheet for Healthcare Providers:  IncredibleEmployment.be  This test is no t yet approved or cleared by the [place] FDA and  has been authorized for detection and/or diagnosis of SARS-CoV-2 by FDA under an Emergency Use Authorization (EUA). This EUA will remain  in effect (meaning this test can be used) for the duration of the COVID-19 declaration under Section 564(b)(1) of the Act, 21 U.S.C.section 360bbb-3(b)(1), unless the authorization is terminated  or revoked sooner.       Influenza A by PCR NEGATIVE NEGATIVE Final   Influenza B by PCR NEGATIVE NEGATIVE Final    Comment: (NOTE) The Xpert Xpress SARS-CoV-2/FLU/RSV plus assay is intended as an aid in the diagnosis of influenza from Nasopharyngeal swab specimens and should not be used as a sole basis for treatment. Nasal washings  and aspirates are unacceptable for Xpert Xpress SARS-CoV-2/FLU/RSV testing.  Fact Sheet for Patients: EntrepreneurPulse.com.au  Fact Sheet for Healthcare Providers: IncredibleEmployment.be  This test is not yet approved or cleared by the [place] FDA and has been authorized for detection and/or diagnosis of SARS-CoV-2 by FDA under an Emergency Use Authorization (EUA). This EUA will remain in effect (meaning this test can be used) for the duration of the COVID-19 declaration under Section 564(b)(1) of the Act, 21 U.S.C. section 360bbb-3(b)(1), unless the authorization is terminated or revoked.  Performed at [facility], [place] [address] [place], [place] [address]          Radiology Studies: DG Abd 1 View  Result Date: 12/29/2021 CLINICAL DATA:  [phone] patient reported nausea and vomiting this morning along with abdominal pain EXAM: ABDOMEN - 1 VIEW COMPARISON:  None Available. FINDINGS: The bowel gas pattern is unremarkable. Mild-to-moderate stool burden in the descending sigmoid colon. Again seen are the multiple coarse calcifications corresponding to the calcifications of the uterine fibroids as was seen in the CT of the abdomen dated January 03, 2017. Severe lumbosacral spondylosis with prominent marginal osteophytes endplate sclerosis and loss of the disc spaces. IMPRESSION: Bowel-gas pattern is nonobstructive. Calcifications of the uterine fibroids.  Severe lumbar spondylosis. Electronically Signed   By: Frazier Richards M.D.   On: 12/29/2021 10:01        Scheduled Meds:  Chlorhexidine Gluconate Cloth  6 each Topical Daily   ezetimibe  10 mg Oral Daily   montelukast  10 mg Oral QHS   [START ON 12/30/2021] polyethylene glycol  17 g Oral Daily   Rivaroxaban  15 mg Oral BID WC   Followed by   Derrill Memo ON 01/18/2022] rivaroxaban  20 mg Oral Q supper   senna-docusate  2 tablet Oral BID   simvastatin  20 mg Oral q1800    Continuous Infusions:     LOS: 3 days    Time spent: 46 minutes spent on chart review, discussion with nursing staff, consultants, updating family and interview/physical exam; more than 50% of that time was spent in counseling and/or coordination of care.    Reynalda Canny J [place] DO [facility] Available via Epic secure chat 7am-7pm After these hours, please refer to coverage provider listed on [email or web address] 12/29/2021, 12:54 PM

## 2021-12-30 DIAGNOSIS — I2510 Atherosclerotic heart disease of native coronary artery without angina pectoris: Secondary | ICD-10-CM | POA: Diagnosis not present

## 2021-12-30 DIAGNOSIS — I2699 Other pulmonary embolism without acute cor pulmonale: Secondary | ICD-10-CM | POA: Diagnosis not present

## 2021-12-30 DIAGNOSIS — I82403 Acute embolism and thrombosis of unspecified deep veins of lower extremity, bilateral: Secondary | ICD-10-CM | POA: Diagnosis not present

## 2021-12-30 DIAGNOSIS — E782 Mixed hyperlipidemia: Secondary | ICD-10-CM | POA: Diagnosis not present

## 2021-12-30 LAB — COMPREHENSIVE METABOLIC PANEL
ALT: 36 U/L (ref 0–44)
AST: 29 U/L (ref 15–41)
Albumin: 3.3 g/dL — ABNORMAL LOW (ref 3.5–5.0)
Alkaline Phosphatase: 64 U/L (ref 38–126)
Anion gap: 9 (ref 5–15)
BUN: 11 mg/dL (ref 8–23)
CO2: 27 mmol/L (ref 22–32)
Calcium: 9.4 mg/dL (ref 8.9–10.3)
Chloride: 100 mmol/L (ref 98–111)
Creatinine, Ser: 0.67 mg/dL (ref 0.44–1.00)
GFR, Estimated: >60 mL/min (ref >60)
Glucose, Bld: 143 mg/dL — ABNORMAL HIGH (ref 70–99)
Potassium: 3.9 mmol/L (ref 3.5–5.1)
Sodium: 136 mmol/L (ref 135–145)
Total Bilirubin: 0.7 mg/dL (ref 0.3–1.2)
Total Protein: 7.2 g/dL (ref 6.5–8.1)

## 2021-12-30 LAB — CBC
HCT: 34.7 % — ABNORMAL LOW (ref 36.0–46.0)
Hemoglobin: 11.1 g/dL — ABNORMAL LOW (ref 12.0–15.0)
MCH: 30.2 pg (ref 26.0–34.0)
MCHC: 32 g/dL (ref 30.0–36.0)
MCV: 94.3 fL (ref 80.0–100.0)
Platelets: 175 10*3/uL (ref 150–400)
RBC: 3.68 MIL/uL — ABNORMAL LOW (ref 3.87–5.11)
RDW: 15.9 % — ABNORMAL HIGH (ref 11.5–15.5)
WBC: 5.8 10*3/uL (ref 4.0–10.5)
nRBC: 0 % (ref 0.0–0.2)

## 2021-12-30 MED ORDER — SENNOSIDES-DOCUSATE SODIUM 8.6-50 MG PO TABS: 2.0000 | ORAL_TABLET | Freq: Two times a day (BID) | ORAL | Status: DC

## 2021-12-30 MED ORDER — POLYETHYLENE GLYCOL 3350 17 G PO PACK: 17.0000 g | PACK | Freq: Two times a day (BID) | ORAL | 0 refills | Status: DC

## 2021-12-30 MED ORDER — RIVAROXABAN (XARELTO) VTE STARTER PACK (15 & 20 MG): ORAL_TABLET | ORAL | 0 refills | Status: DC

## 2021-12-30 MED ORDER — AMLODIPINE BESYLATE 5 MG PO TABS
5.0000 mg | ORAL_TABLET | Freq: Every day | ORAL | Status: DC
Start: 2021-12-30 — End: 2022-10-22

## 2021-12-30 MED ORDER — HEPARIN SOD (PORK) LOCK FLUSH 100 UNIT/ML IV SOLN
500.0000 [IU] | Freq: Once | INTRAVENOUS | Status: AC
  Administered 2021-12-30: 500 [IU] via INTRAVENOUS
  Filled 2021-12-30: qty 5

## 2021-12-30 NOTE — Progress Notes (Signed)
Report called to [facility], questions asked and answer SBAR followed. Port removed by [employer], belongings with patient to be transported with patient by [facility].

## 2021-12-30 NOTE — Discharge Summary (Signed)
Physician Discharge Summary  Arwyn Besaw FGH:[id number] DOB: 06-Apr-1936 DOA: 12/25/2021  PCP: Crist Infante, MD  Admit date: 12/25/2021 Discharge date: 12/30/2021  Admitted From: Home Disposition: [facility]  Recommendations for Outpatient Follow-up:  Follow up with PCP in 1-2 weeks Started on Xarelto for PE/DVT, start date 12/28/2021 Discontinued irbesartan and chlorthalidone Decrease amlodipine to 5 mg p.o. daily Continue to monitor BP closely and adjust antihypertensive as needed, was borderline hypotensive during hospitalization Would benefit from on transition from short-term rehab to IDL versus ALF  Discharge Condition: Stable CODE STATUS: DNR Diet recommendation: Heart healthy diet  History of present illness:  Inga Noller is a 86 y.o. female with past medical history significant for pancreatic cancer on chemotherapy who was sent over from the cancer center to [facility] ED for progressive shortness of breath given concern for possible pulmonary embolism.  Patient reported that she developed sudden onset shortness of breath earlier this morning, and she knew she had an appointment with her oncology providers.  Patient also reported difficulty walking.  Denied chest pain, no hemoptysis or palpitations.   In the ED, patient was noted to have a SPO2 of 84% on room air and placed on 4 L nasal cannula, RR 28, temperature 98.1 F, BP 136/80.  WBC 5.9, hemoglobin 11.8, platelets 152.  Sodium 138, potassium 3.6, chloride 103, CO2 28, glucose 150.  BUN 21, creatinine 0.61.  AST 17, ALT 22, total bilirubin 0.7.  COVID-19 PCR negative.  Influenza A/B PCR negative.  Chest x-ray with increasing opacity right lower chest with no nodules visual thickening, suspected right-sided small pleural effusion.  CT angiogram chest PE study with positive bilateral pulmonary emboli with CT evidence of right heart strain, slight increased right pleural effusion, stable pancreatic tail mass with marginal  progression of pulmonary nodules.  Patient was started on IV heparin drip.  TRH consulted for further evaluation and management of acute hypoxic respiratory failure in the setting of pulmonary embolism.  Hospital course:  Pulmonary embolism, bilateral Bilateral lower extremity DVT Patient presenting with acute shortness of breath.  CT angiogram chest with positive bilateral pulm emboli with CT evidence of right heart strain.  Bilateral duplex ultrasound lower extremities positive for acute DVT right lower extremity and age-indeterminate DVT left lower extremity.  TTE with LVEF 6789%, grade 1 diastolic dysfunction, RV systolic function not well visualized but appears grossly normal, trivial MR, IVC normal in size.  Titrated off of supplemental oxygen, no desaturation on ambulatory O2 screen 7/5.  Heparin drip transition to Xarelto on 12/28/2021.  Outpatient follow-up with PCP.   Pancreatic cancer Follows with medical oncology outpatient, Dr. Burr Medico. Currently on chemotherapy.  Continue outpatient follow-up.   CAD Stable, denies chest pain. Continue statin   OSA: Continue nocturnal CPAP   Essential hypertension On amlodipine 10 mg p.o. daily, chlorthalidone 12.5 mg p.o. daily, irbesartan 300 mg p.o. daily outpatient.  Discontinue chlorthalidone and irbesartan.  Continue amlodipine at reduced dose 5 mg p.o. daily.  Continue monitor BP closely outpatient.   Asthma Stable, now titrated off of supple oxygen as above.  No wheezing.  Continue home Singulair 10 mg p.o. nightly   Hyperlipidemia Continue simvastatin 20 g p.o. daily, zetia 10 mg p.o. daily   Constipation: Patient complaining of abdominal pain associate with nausea/vomiting this morning.  KUB notable for moderate stool burden descending colon.  Continue Senokot-S 2 tabs PO BID, MiraLAX twice daily.   Weakness/debility/gait disturbance: Evaluated by PT/OT with recommendation for SNF placement.  Discharging to [facility] for further  rehabilitation.  Likely would benefit from independent living versus assisted living facility once acute rehab stay concludes.  Discharge Diagnoses:  Principal Problem:   Acute pulmonary embolism (HCC) Active Problems:   Essential hypertension   Hyperlipidemia   Coronary arteriosclerosis   Pancreatic cancer ([place])   Port-A-Cath in place   Pulmonary embolism ([place])   DVT (deep venous thrombosis) [facility])    Discharge Instructions  Discharge Instructions     Call MD for:  difficulty breathing, headache or visual disturbances   Complete by: As directed    Call MD for:  extreme fatigue   Complete by: As directed    Call MD for:  persistant dizziness or light-headedness   Complete by: As directed    Call MD for:  persistant nausea and vomiting   Complete by: As directed    Call MD for:  severe uncontrolled pain   Complete by: As directed    Call MD for:  temperature >100.4   Complete by: As directed    Diet - low sodium heart healthy   Complete by: As directed    Increase activity slowly   Complete by: As directed       Allergies as of 12/30/2021       Reactions   Lipitor [atorvastatin]    Stomach pains, heart attack sx's        Medication List     STOP taking these medications    chlorthalidone 25 MG tablet Commonly known as: HYGROTON   ibuprofen 600 MG tablet Commonly known as: ADVIL   irbesartan 300 MG tablet Commonly known as: AVAPRO   potassium chloride 10 MEQ tablet Commonly known as: KLOR-CON M   traMADol 50 MG tablet Commonly known as: ULTRAM       TAKE these medications    amLODipine 5 MG tablet Commonly known as: NORVASC Take 1 tablet (5 mg total) by mouth daily. What changed:  medication strength how much to take   dicyclomine 20 MG tablet Commonly known as: BENTYL Take 1 tablet (20 mg total) by mouth 2 (two) times daily.   ezetimibe 10 MG tablet Commonly known as: ZETIA Take 10 mg by mouth daily.   lidocaine-prilocaine  cream Commonly known as: EMLA Apply to affected area once What changed:  how much to take when to take this reasons to take this additional instructions   montelukast 10 MG tablet Commonly known as: SINGULAIR Take 1 tablet by mouth at bedtime.   multivitamin tablet Take 1 tablet by mouth daily.   ondansetron 8 MG tablet Commonly known as: Zofran Take 1 tablet (8 mg total) by mouth 2 (two) times daily as needed (Nausea or vomiting).   polyethylene glycol 17 g packet Commonly known as: MIRALAX / GLYCOLAX Take 17 g by mouth 2 (two) times daily.   prochlorperazine 10 MG tablet Commonly known as: COMPAZINE Take 1 tablet (10 mg total) by mouth every 6 (six) hours as needed (Nausea or vomiting).   Rivaroxaban Stater Pack (15 mg and 20 mg) Commonly known as: XARELTO STARTER PACK Follow package directions: Take one '15mg'$  tablet by mouth twice a day. On day 22, switch to one '20mg'$  tablet once a day. Take with food.   senna-docusate 8.6-50 MG tablet Commonly known as: Senokot-S Take 2 tablets by mouth 2 (two) times daily.   simvastatin 40 MG tablet Commonly known as: ZOCOR Take 20 mg by mouth daily.  Follow-up Information     Crist Infante, MD. Schedule an appointment as soon as possible for a visit in 1 week(s).   Specialty: Internal Medicine Contact information: [place] [address] [phone]         Lorretta Harp, MD .   Specialties: Cardiology, Radiology Contact information: [address] [place] [address] [place] [address] [phone]                Allergies  Allergen Reactions   Lipitor [Atorvastatin]     Stomach pains, heart attack sx's    Consultations: Pulmonary/critical care medicine, Dr. Verlee Monte   Procedures/Studies: DG Abd 1 View  Result Date: 12/29/2021 CLINICAL DATA:  [id number] patient reported nausea and vomiting this morning along with abdominal pain EXAM: ABDOMEN - 1 VIEW COMPARISON:  None Available.  FINDINGS: The bowel gas pattern is unremarkable. Mild-to-moderate stool burden in the descending sigmoid colon. Again seen are the multiple coarse calcifications corresponding to the calcifications of the uterine fibroids as was seen in the CT of the abdomen dated January 03, 2017. Severe lumbosacral spondylosis with prominent marginal osteophytes endplate sclerosis and loss of the disc spaces. IMPRESSION: Bowel-gas pattern is nonobstructive. Calcifications of the uterine fibroids. Severe lumbar spondylosis. Electronically Signed   By: Frazier Richards M.D.   On: 12/29/2021 10:01   ECHOCARDIOGRAM COMPLETE  Result Date: 12/26/2021    ECHOCARDIOGRAM REPORT   Patient Name:   ELECTA STERRY [facility] Date of Exam: 12/26/2021 Medical Rec #:  [id number]           Height:       64.0 in Accession #:    [id number]          Weight:       177.6 lb Date of Birth:  1936/06/06          BSA:          1.860 m Patient Age:    86 years            BP:           125/72 mmHg Patient Gender: F                   HR:           81 bpm. Exam Location:  Inpatient Procedure: 2D Echo, Color Doppler and Cardiac Doppler Indications:    I26.02 Pulmonary embolus  History:        Patient has no prior history of Echocardiogram examinations.                 Risk Factors:Hypertension, Dyslipidemia and Sleep Apnea.  Sonographer:    Raquel Sarna Senior RDCS Referring Phys: Theola Sequin  Sonographer Comments: VERY poor echo windows due to lung interference, entire study performed from subcostal. IMPRESSIONS  1. Limited study due to very poor echo windows.  2. Left ventricular ejection fraction, by estimation, is 70 to 75%. The left ventricle has hyperdynamic function. Left ventricular endocardial border not optimally defined to evaluate regional wall motion. Left ventricular diastolic parameters are consistent with Grade I diastolic dysfunction (impaired relaxation).  3. Right ventricular systolic function was not well visualized but appears grossly normal on limited  views. The right ventricular size is not well visualized. There is normal pulmonary artery systolic pressure. The estimated right ventricular systolic pressure is 25.9 mmHg.  4. The mitral valve is grossly normal. Trivial mitral valve regurgitation. Moderate mitral annular calcification.  5. The aortic valve is tricuspid. There is  mild calcification of the aortic valve. There is mild thickening of the aortic valve. Aortic valve regurgitation is not visualized. Aortic valve sclerosis/calcification is present, without any evidence of aortic stenosis.  6. The inferior vena cava is normal in size with greater than 50% respiratory variability, suggesting right atrial pressure of 3 mmHg. FINDINGS  Left Ventricle: Left ventricular ejection fraction, by estimation, is 70 to 75%. The left ventricle has hyperdynamic function. Left ventricular endocardial border not optimally defined to evaluate regional wall motion. The left ventricular internal cavity size was normal in size. There is no left ventricular hypertrophy. Left ventricular diastolic parameters are consistent with Grade I diastolic dysfunction (impaired relaxation). Right Ventricle: The right ventricular size is not well visualized. Right vetricular wall thickness was not well visualized. Right ventricular systolic function was not well visualized. There is normal pulmonary artery systolic pressure. The tricuspid regurgitant velocity is 2.46 m/s, and with an assumed right atrial pressure of 3 mmHg, the estimated right ventricular systolic pressure is 96.7 mmHg. Left Atrium: Left atrial size was not well visualized. Right Atrium: Right atrial size was not well visualized. Pericardium: There is no evidence of pericardial effusion. Mitral Valve: The mitral valve is grossly normal. Moderate mitral annular calcification. Trivial mitral valve regurgitation. Tricuspid Valve: The tricuspid valve is normal in structure. Tricuspid valve regurgitation is trivial. Aortic  Valve: The aortic valve is tricuspid. There is mild calcification of the aortic valve. There is mild thickening of the aortic valve. Aortic valve regurgitation is not visualized. Aortic valve sclerosis/calcification is present, without any evidence of aortic stenosis. Pulmonic Valve: The pulmonic valve was normal in structure. Pulmonic valve regurgitation is trivial. Aorta: The aortic root is normal in size and structure. Venous: The inferior vena cava is normal in size with greater than 50% respiratory variability, suggesting right atrial pressure of 3 mmHg. IAS/Shunts: The atrial septum is grossly normal.  LEFT VENTRICLE PLAX 2D LVIDd:         3.70 cm   Diastology LVIDs:         2.00 cm   LV e' medial:    6.15 cm/s LV PW:         0.90 cm   LV E/e' medial:  6.1 LV IVS:        0.90 cm   LV e' lateral:   7.09 cm/s LVOT diam:     1.90 cm   LV E/e' lateral: 5.3 LVOT Area:     2.84 cm  RIGHT VENTRICLE RV S prime:     19.00 cm/s TAPSE (M-mode): 2.2 cm LEFT ATRIUM           Index        RIGHT ATRIUM          Index LA diam:      3.30 cm 1.77 cm/m   RA Area:     8.21 cm LA Vol (A4C): 67.6 ml 36.37 ml/m  RA Volume:   13.40 ml 7.20 ml/m   AORTA Ao Root diam: 3.10 cm MITRAL VALVE               TRICUSPID VALVE MV Area (PHT): 2.48 cm    TR Peak grad:   24.2 mmHg MV Decel Time: 306 msec    TR Vmax:        246.00 cm/s MV E velocity: 37.70 cm/s MV A velocity: 82.30 cm/s  SHUNTS MV E/A ratio:  0.46        Systemic Diam: 1.90 cm Gwyndolyn Kaufman  MD Electronically signed by Gwyndolyn Kaufman MD Signature Date/Time: 12/26/2021/1:55:41 PM    Final    VAS [place] LOWER EXTREMITY VENOUS (DVT) (ONLY [facility] & [facility])  Result Date: 12/25/2021  Lower Venous DVT Study Patient Name:  HAISLEY ARENS [facility]  Date of Exam:   12/25/2021 Medical Rec #: [id number]            Accession #:    [id number] Date of Birth: [birth year]           Patient Gender: F Patient Age:   86 years Exam Location:  [facility] Procedure:      VAS [place] LOWER EXTREMITY VENOUS  (DVT) Referring Phys: Regan Lemming --------------------------------------------------------------------------------  Indications: Swelling, SOB, and Right lower extremity swelling.  Risk Factors: Cancer Pancreatic. 08/30/2021 CT showed DVT (Central filling defect within an enlarged left femoral vein) no follow up ultrasound ordered. Comparison Study: No prior study on file Performing Technologist: Sharion Dove RVS  Examination Guidelines: A complete evaluation includes B-mode imaging, spectral Doppler, color Doppler, and power Doppler as needed of all accessible portions of each vessel. Bilateral testing is considered an integral part of a complete examination. Limited examinations for reoccurring indications may be performed as noted. The reflux portion of the exam is performed with the patient in reverse Trendelenburg.  +----------+---------------+---------+-----------+----------+-----------------+ RIGHT     CompressibilityPhasicitySpontaneityPropertiesThrombus Aging    +----------+---------------+---------+-----------+----------+-----------------+ CFV       Full           Yes      No                   Acute, proximal                                                          portion           +----------+---------------+---------+-----------+----------+-----------------+ SFJ       Full                                                           +----------+---------------+---------+-----------+----------+-----------------+ FV Prox   Partial        Yes      Yes                  Acute             +----------+---------------+---------+-----------+----------+-----------------+ FV Mid    None           No       No                   Acute             +----------+---------------+---------+-----------+----------+-----------------+ FV Distal None           No       No                   Acute              +----------+---------------+---------+-----------+----------+-----------------+ PFV       Partial        Yes      No  Acute             +----------+---------------+---------+-----------+----------+-----------------+ POP       None           No       No                   Acute             +----------+---------------+---------+-----------+----------+-----------------+ PTV       None           No       No                   Acute             +----------+---------------+---------+-----------+----------+-----------------+ PERO      None                                         Acute             +----------+---------------+---------+-----------+----------+-----------------+ Gastroc   None           No       No                   Acute             +----------+---------------+---------+-----------+----------+-----------------+ Distal CIV               Yes      Yes                  patent            +----------+---------------+---------+-----------+----------+-----------------+   +----------+---------------+---------+-----------+----------+-----------------+ LEFT      CompressibilityPhasicitySpontaneityPropertiesThrombus Aging    +----------+---------------+---------+-----------+----------+-----------------+ CFV       Partial        Yes      Yes                  Age Indeterminate +----------+---------------+---------+-----------+----------+-----------------+ SFJ       Full                                                           +----------+---------------+---------+-----------+----------+-----------------+ FV Prox   Partial        Yes      Yes                  Age Indeterminate +----------+---------------+---------+-----------+----------+-----------------+ FV Mid    Partial        No       No                   Age Indeterminate +----------+---------------+---------+-----------+----------+-----------------+ FV Distal Partial         No       No                   Age Indeterminate +----------+---------------+---------+-----------+----------+-----------------+ PFV       Partial        Yes      Yes                  Age Indeterminate +----------+---------------+---------+-----------+----------+-----------------+ POP       Partial  No       No                   Age Indeterminate +----------+---------------+---------+-----------+----------+-----------------+ PTV       None           No       No                   Age Indeterminate +----------+---------------+---------+-----------+----------+-----------------+ PERO      None                                         Age Indeterminate +----------+---------------+---------+-----------+----------+-----------------+ Gastroc   Full                                                           +----------+---------------+---------+-----------+----------+-----------------+ EIV                      Yes      Yes                  patent            +----------+---------------+---------+-----------+----------+-----------------+ Distal CIV               Yes      Yes                  patent            +----------+---------------+---------+-----------+----------+-----------------+     Summary: RIGHT: - Findings consistent with acute deep vein thrombosis involving the right common femoral vein, right femoral vein, right proximal profunda vein, right popliteal vein, right posterior tibial veins, right peroneal veins, and right gastrocnemius veins. no obvious propagation to the distal common iliac vein  LEFT: - Findings consistent with age indeterminate deep vein thrombosis involving the left common femoral vein, left femoral vein, left proximal profunda vein, left posterior tibial veins, left popliteal vein, and left peroneal veins. No obvious propagation to the EIV or to the distal common iliac veins.  *See table(s) above for measurements and observations.  Electronically signed by Deitra Mayo MD on 12/25/2021 at 4:27:46 PM.    Final    CT Angio Chest PE W and/or Wo Contrast  Result Date: 12/25/2021 CLINICAL DATA:  Pancreatic carcinoma, shortness of breath, weakness EXAM: CT ANGIOGRAPHY CHEST WITH CONTRAST TECHNIQUE: Multidetector CT imaging of the chest was performed using the standard protocol during bolus administration of intravenous contrast. Multiplanar CT image reconstructions and MIPs were obtained to evaluate the vascular anatomy. RADIATION DOSE REDUCTION: This exam was performed according to the departmental dose-optimization program which includes automated exposure control, adjustment of the mA and/or kV according to patient size and/or use of iterative reconstruction technique. CONTRAST:  64m OMNIPAQUE IOHEXOL 350 MG/ML SOLN COMPARISON:  09/06/2021 FINDINGS: Cardiovascular: Right IJ port catheter to the distal SVC. Heart size normal. No pericardial effusion. Abnormally increased RV/LV ratio 1.43. Good contrast opacification of pulmonary arterial tree. There are central near occlusive central emboli in right upper lobe pulmonary artery extending into segmental branches distally. On the left there is a saddle embolus extending into left upper lobe, lingular, and lower lobe segmental branches. Moderate coronary calcifications. Good contrast  opacification of the thoracic aorta without aneurysm, dissection, or stenosis. Scattered calcified atheromatous plaque in the arch and proximal descending thoracic aorta which is nondilated. Mediastinum/Nodes: Small hiatal hernia. No mediastinal hematoma, mass or adenopathy. Lungs/Pleura: Some interval increase in posterior right pleural effusion, with mixed-attenuation loculated component in the major fissure also increased in size. No left effusion. 4 mm central left upper lobe nodule previously 5 mm. 1 cm right middle lobe nodule (Im104,Se6, previously 7 mm. More inferiorly 1.3 cm perifissural nodule,  previously 1.1 cm.) Upper Abdomen: 4.1 cm infiltrative pancreatic tail lesion grossly unchanged. Stable subcentimeter low-attenuation nonspecific liver lesions. Musculoskeletal: Anterior vertebral endplate spurring at multiple levels in the mid and lower thoracic spine. Bilateral shoulder DJD. Review of the MIP images confirms the above findings. IMPRESSION: 1. Positive for bilateral pulmonary emboli with CT evidence of right heart strain (RV/LV Ratio = 1.43) consistent with at least submassive (intermediate risk) PE. The presence of right heart strain has been associated with an increased risk of morbidity and mortality. Please refer to the "Code PE Focused" order set in EPIC. Critical Value/emergent results were called by telephone at the time of interpretation on 12/25/2021 at 3:36 pm to provider [facility] , who verbally acknowledged these results. 2. Slight increase in right pleural effusion. 3. Stable pancreatic tail mass with marginal progression of pulmonary nodules. 4. Coronary and Aortic Atherosclerosis (ICD10-170.0). Electronically Signed   By: Lucrezia Europe M.D.   On: 12/25/2021 15:36   DG Chest Port 1 View  Result Date: 12/25/2021 CLINICAL DATA:  An 86 year old female present as with shortness of breath with history of pancreatic cancer. EXAM: PORTABLE CHEST 1 VIEW COMPARISON:  September 06, 2021. FINDINGS: RIGHT-sided Port-A-Cath in place, tip in the area of the distal superior vena cava or caval to atrial junction. EKG leads project over the patient's chest. Cardiomediastinal contours and hilar structures are stable. Increasing opacity in the RIGHT lower chest in this patient with known nodules in this area and fissural thickening. Suspect small RIGHT-sided pleural effusion as well. No lobar level consolidative process. No pneumothorax. On limited assessment there is no acute skeletal finding with marked glenohumeral degenerative changes RIGHT greater than LEFT. IMPRESSION: Increasing opacity in the  RIGHT lower chest in this patient with known nodules in this area and fissural thickening. Suspect small RIGHT-sided pleural effusion as well. This may reflect worsening of disease in the chest or superimposed infection or inflammation. Electronically Signed   By: Zetta Bills M.D.   On: 12/25/2021 12:41   CT ABDOMEN PELVIS W CONTRAST  Result Date: 12/11/2021 CLINICAL DATA:  Metastatic pancreatic cancer. Evaluate for recurrence. EXAM: CT ABDOMEN AND PELVIS WITH CONTRAST TECHNIQUE: Multidetector CT imaging of the abdomen and pelvis was performed using the standard protocol following bolus administration of intravenous contrast. RADIATION DOSE REDUCTION: This exam was performed according to the departmental dose-optimization program which includes automated exposure control, adjustment of the mA and/or kV according to patient size and/or use of iterative reconstruction technique. CONTRAST:  120m OMNIPAQUE IOHEXOL 300 MG/ML  SOLN COMPARISON:  Abdominopelvic CT 10/18/2021 and 08/30/2021. FINDINGS: Lower chest: Small dependent right pleural effusion appears unchanged. No significant pleural nodularity identified. Partially confluent lymph nodes at the right cardiophrenic angle are unchanged. Atherosclerosis of the aorta and coronary arteries. Hepatobiliary: Scattered low-density hepatic lesions are unchanged. No new or enlarging lesions are identified. No evidence of gallstones, gallbladder wall thickening or biliary dilatation. Pancreas: Infiltrative mass involving the pancreatic tail and splenic hilum measures approximately 5.0 x 3.7  cm on image 34/5 (previously 5.1 x 4.1 cm. As before, this may and they the splenic flexure of the colon. The pancreatic head and body appear normal. No pancreatic ductal dilatation. Spleen: Normal in size without focal abnormality. Pancreatic tail lesion abuts the splenic hilum without gross invasion. Adrenals/Urinary Tract: Both adrenal glands appear normal. Stable low-density  renal lesions bilaterally which are likely cysts. No follow-up imaging recommended. No evidence of urinary tract calculus or hydronephrosis. Nodularity along the posterior left aspect of the bladder dome is grossly stable and may relate to exophytic uterine fibroids and/or peritoneal carcinomatosis. Stomach/Bowel: Enteric contrast was administered and has passed into the transverse colon. The stomach appears unremarkable for its degree of distention. The small bowel and proximal colon appear normal, without wall thickening or surrounding inflammation. As above, the infiltrative pancreatic tail mass mandible the splenic flexure of the colon. No evidence of colonic obstruction or perforation. Moderate stool throughout the colon. Vascular/Lymphatic: There are no enlarged abdominal or pelvic lymph nodes. Aortic and branch vessel atherosclerosis without evidence of acute vascular occlusion. 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 Reproductive: Multiple calcified uterine fibroids. Other: As before, extensive peritoneal carcinomatosis with a large area of omental caking measuring up to 11.0 x 2.8 cm on image 67/5. Tumor extends into a small umbilical hernia. Mesenteric implants measure up to 1.6 cm on the right (image 89/5) and 1.4 cm on the left (image 34/5). No significant ascites. Overall, the peritoneal disease appears similar to the most recent study. Musculoskeletal: No acute or significant osseous findings. Multilevel lumbar spondylosis with probable resulting chronic spinal stenosis at L2-3 and L3-4. IMPRESSION: 1. Compared with prior imaging over the last 3 months, little change in the infiltrative mass involving the pancreatic tail which may invade the splenic flexure of the colon and extensive omental and peritoneal carcinomatosis. No definite disease progression identified. 2. Stable right pleural effusion with bandlike scarring at the right lung base and partial  confluent lymph nodes at the right cardiophrenic angle. 3. Calcified uterine fibroids. 4.  Aortic Atherosclerosis (ICD10-I70.0). Electronically Signed   By: Richardean Sale M.D.   On: 12/11/2021 15:08     Subjective: Patient seen and examined at bedside, resting comfortably.  Lying in bed.  Niece present.  No specific complaints this morning.  Discharging to SNF.  Denies headache, no chest pain, no palpitations, no shortness of breath, no abdominal pain, no fever/chills/night sweats, no nausea/vomiting/diarrhea, no focal weakness, no fatigue, no paresthesias.  No acute events overnight per nursing staff.  Discharge Exam: Vitals:   12/29/21 2237 12/30/21 0529  BP: 135/79 (!) 146/85  Pulse: 82 84  Resp: 14 14  Temp: 98.7 F (37.1 C) 98.6 F (37 C)  SpO2: 95% 91%   Vitals:   12/29/21 0622 12/29/21 1355 12/29/21 2237 12/30/21 0529  BP: 140/90 (!) 142/82 135/79 (!) 146/85  Pulse: 84 87 82 84  Resp: '16 18 14 14  '$ Temp: 98.7 F (37.1 C) 98.9 F (37.2 C) 98.7 F (37.1 C) 98.6 F (37 C)  TempSrc: Oral Oral Oral Oral  SpO2: 94% 95% 95% 91%  Weight: 81.2 kg   81.3 kg  Height:        Physical Exam: GEN: NAD, alert and oriented x 3, elderly in appearance HEENT: NCAT, PERRL, EOMI, sclera clear, MMM PULM: CTAB w/o wheezes/crackles, normal respiratory effort, on room air CV: RRR w/o M/G/R GI: abd soft, NTND, NABS, no R/G/M MSK: no peripheral edema, muscle strength globally  intact 5/5 bilateral upper/lower extremities NEURO: CN II-XII intact, no focal deficits, sensation to light touch intact PSYCH: normal mood/affect Integumentary: dry/intact, no rashes or wounds    The results of significant diagnostics from this hospitalization (including imaging, microbiology, ancillary and laboratory) are listed below for reference.     Microbiology: Recent Results (from the past 240 hour(s))  Resp Panel by RT-PCR (Flu A&B, Covid) Anterior Nasal Swab     Status: None   Collection Time: 12/25/21  11:34 AM   Specimen: Anterior Nasal Swab  Result Value Ref Range Status   SARS Coronavirus 2 by RT PCR NEGATIVE NEGATIVE Final    Comment: (NOTE) SARS-CoV-2 target nucleic acids are NOT DETECTED.  The SARS-CoV-2 RNA is generally detectable in upper respiratory specimens during the acute phase of infection. The lowest concentration of SARS-CoV-2 viral copies this assay can detect is 138 copies/mL. A negative result does not preclude SARS-Cov-2 infection and should not be used as the sole basis for treatment or other patient management decisions. A negative result may occur with  improper specimen collection/handling, submission of specimen other than nasopharyngeal swab, presence of viral mutation(s) within the areas targeted by this assay, and inadequate number of viral copies(<138 copies/mL). A negative result must be combined with clinical observations, patient history, and epidemiological information. The expected result is Negative.  Fact Sheet for Patients:  EntrepreneurPulse.com.au  Fact Sheet for Healthcare Providers:  IncredibleEmployment.be  This test is no t yet approved or cleared by the [place] FDA and  has been authorized for detection and/or diagnosis of SARS-CoV-2 by FDA under an Emergency Use Authorization (EUA). This EUA will remain  in effect (meaning this test can be used) for the duration of the COVID-19 declaration under Section 564(b)(1) of the Act, 21 U.S.C.section 360bbb-3(b)(1), unless the authorization is terminated  or revoked sooner.       Influenza A by PCR NEGATIVE NEGATIVE Final   Influenza B by PCR NEGATIVE NEGATIVE Final    Comment: (NOTE) The Xpert Xpress SARS-CoV-2/FLU/RSV plus assay is intended as an aid in the diagnosis of influenza from Nasopharyngeal swab specimens and should not be used as a sole basis for treatment. Nasal washings and aspirates are unacceptable for Xpert Xpress  SARS-CoV-2/FLU/RSV testing.  Fact Sheet for Patients: EntrepreneurPulse.com.au  Fact Sheet for Healthcare Providers: IncredibleEmployment.be  This test is not yet approved or cleared by the [place] FDA and has been authorized for detection and/or diagnosis of SARS-CoV-2 by FDA under an Emergency Use Authorization (EUA). This EUA will remain in effect (meaning this test can be used) for the duration of the COVID-19 declaration under Section 564(b)(1) of the Act, 21 U.S.C. section 360bbb-3(b)(1), unless the authorization is terminated or revoked.  Performed at [facility], [place] [address], [place], [place] [address]      Labs: BNP (last 3 results) Recent Labs    12/25/21 1134  BNP 01.7   Basic Metabolic Panel: Recent Labs  Lab 12/25/21 1001 12/25/21 1134 12/26/21 0511 12/30/21 0514  NA 138 139 140 136  K 3.6 3.7 4.3 3.9  CL 103 105 104 100  CO2 '28 27 27 27  '$ GLUCOSE 150* 139* 143* 143*  BUN '21 23 20 11  '$ CREATININE 0.61 0.50 0.63 0.67  CALCIUM 10.0 9.6 9.5 9.4   Liver Function Tests: Recent Labs  Lab 12/25/21 1001 12/25/21 1134 12/26/21 0511 12/30/21 0514  AST '17 18 18 29  '$ ALT '22 25 23 '$ 36  ALKPHOS 85 76 68  64  BILITOT 0.7 0.8 0.8 0.7  PROT 7.8 7.5 7.3 7.2  ALBUMIN 3.7 3.5 3.2* 3.3*   No results for input(s): "LIPASE", "AMYLASE" in the last 168 hours. No results for input(s): "AMMONIA" in the last 168 hours. CBC: Recent Labs  Lab 12/25/21 1001 12/25/21 1134 12/26/21 0511 12/27/21 0507 12/28/21 0517 12/30/21 0514  WBC 5.9 6.1 5.7 4.9 5.2 5.8  NEUTROABS 4.2 4.4  --   --   --   --   HGB 11.8* 11.7* 11.2* 10.5* 10.7* 11.1*  HCT 35.5* 35.9* 35.1* 32.6* 33.5* 34.7*  MCV 90.6 94.5 94.6 94.2 94.1 94.3  PLT 152 148* 141* 132* 146* 175   Cardiac Enzymes: No results for input(s): "CKTOTAL", "CKMB", "CKMBINDEX", "TROPONINI" in the last 168 hours. BNP: Invalid input(s): "POCBNP" CBG: No results  for input(s): "GLUCAP" in the last 168 hours. D-Dimer No results for input(s): "DDIMER" in the last 72 hours. Hgb A1c No results for input(s): "HGBA1C" in the last 72 hours. Lipid Profile No results for input(s): "CHOL", "HDL", "LDLCALC", "TRIG", "CHOLHDL", "LDLDIRECT" in the last 72 hours. Thyroid function studies No results for input(s): "TSH", "T4TOTAL", "T3FREE", "THYROIDAB" in the last 72 hours.  Invalid input(s): "FREET3" Anemia work up No results for input(s): "VITAMINB12", "FOLATE", "FERRITIN", "TIBC", "IRON", "RETICCTPCT" in the last 72 hours. Urinalysis    Component Value Date/Time   COLORURINE AMBER (A) 10/18/2021 1224   APPEARANCEUR CLEAR 10/18/2021 1224   LABSPEC 1.020 10/18/2021 1224   PHURINE 5.0 10/18/2021 1224   GLUCOSEU NEGATIVE 10/18/2021 1224   HGBUR NEGATIVE 10/18/2021 1224   BILIRUBINUR NEGATIVE 10/18/2021 1224   BILIRUBINUR n 12/02/2018 1216   KETONESUR NEGATIVE 10/18/2021 1224   PROTEINUR 30 (A) 10/18/2021 1224   UROBILINOGEN 0.2 12/02/2018 1216   NITRITE NEGATIVE 10/18/2021 1224   LEUKOCYTESUR SMALL (A) 10/18/2021 1224   Sepsis Labs Recent Labs  Lab 12/26/21 0511 12/27/21 0507 12/28/21 0517 12/30/21 0514  WBC 5.7 4.9 5.2 5.8   Microbiology Recent Results (from the past 240 hour(s))  Resp Panel by RT-PCR (Flu A&B, Covid) Anterior Nasal Swab     Status: None   Collection Time: 12/25/21 11:34 AM   Specimen: Anterior Nasal Swab  Result Value Ref Range Status   SARS Coronavirus 2 by RT PCR NEGATIVE NEGATIVE Final    Comment: (NOTE) SARS-CoV-2 target nucleic acids are NOT DETECTED.  The SARS-CoV-2 RNA is generally detectable in upper respiratory specimens during the acute phase of infection. The lowest concentration of SARS-CoV-2 viral copies this assay can detect is 138 copies/mL. A negative result does not preclude SARS-Cov-2 infection and should not be used as the sole basis for treatment or other patient management decisions. A negative  result may occur with  improper specimen collection/handling, submission of specimen other than nasopharyngeal swab, presence of viral mutation(s) within the areas targeted by this assay, and inadequate number of viral copies(<138 copies/mL). A negative result must be combined with clinical observations, patient history, and epidemiological information. The expected result is Negative.  Fact Sheet for Patients:  EntrepreneurPulse.com.au  Fact Sheet for Healthcare Providers:  IncredibleEmployment.be  This test is no t yet approved or cleared by the [place] FDA and  has been authorized for detection and/or diagnosis of SARS-CoV-2 by FDA under an Emergency Use Authorization (EUA). This EUA will remain  in effect (meaning this test can be used) for the duration of the COVID-19 declaration under Section 564(b)(1) of the Act, 21 U.S.C.section 360bbb-3(b)(1), unless the authorization is terminated  or  revoked sooner.       Influenza A by PCR NEGATIVE NEGATIVE Final   Influenza B by PCR NEGATIVE NEGATIVE Final    Comment: (NOTE) The Xpert Xpress SARS-CoV-2/FLU/RSV plus assay is intended as an aid in the diagnosis of influenza from Nasopharyngeal swab specimens and should not be used as a sole basis for treatment. Nasal washings and aspirates are unacceptable for Xpert Xpress SARS-CoV-2/FLU/RSV testing.  Fact Sheet for Patients: EntrepreneurPulse.com.au  Fact Sheet for Healthcare Providers: IncredibleEmployment.be  This test is not yet approved or cleared by the [place] FDA and has been authorized for detection and/or diagnosis of SARS-CoV-2 by FDA under an Emergency Use Authorization (EUA). This EUA will remain in effect (meaning this test can be used) for the duration of the COVID-19 declaration under Section 564(b)(1) of the Act, 21 U.S.C. section 360bbb-3(b)(1), unless the authorization is  terminated or revoked.  Performed at [facility], [place] [address] [place], [place] [address]      Time coordinating discharge: Over 30 minutes  SIGNED:   [place] J [place] DO  [facility] 12/30/2021, 9:49 AM

## 2021-12-30 NOTE — Plan of Care (Signed)

## 2021-12-30 NOTE — TOC Progression Note (Addendum)
Transition of Care [facility]) - Progression Note    Patient Details  Name: Kathryn Kent MRN: [id number] Date of Birth: [birth year]  Transition of Care [facility]) CM/SW Contact  Leeroy Cha, RN Phone Number: 12/30/2021, 9:28 AM  Clinical Narrative:    0928/tct-nikki at [facility] to see if patient can be transferred there today.  WCB. [id number] tct-trenisha may send pt today. [id number] /TCF-Nikki will be in [place] in approx one hour and will call with ok to send .  Paperwork has been done. Pt can go to [facility].[id number]. ptar called at [phone].  Transport packet to the unit clerk. Expected Discharge Plan: [place] Barriers to Discharge: Continued Medical Work up  Expected Discharge Plan and Services Expected Discharge Plan: [place]   Discharge Planning Services: CM Consult   Living arrangements for the past 2 months: Single Family Home                                       Social Determinants of Health (SDOH) Interventions    Readmission Risk Interventions     No data to display

## 2022-01-04 ENCOUNTER — Encounter: Payer: Medicare Other | Admitting: Nutrition

## 2022-01-07 NOTE — Progress Notes (Addendum)
Kathryn Kent   [phone] Fax:(336) [phone]   Clinic Follow up Note   Patient Care Team: Crist Infante, MD as PCP - General (Internal Medicine) Lorretta Harp, MD as PCP - Cardiology (Cardiology) Truitt Merle, MD as Consulting Physician (Oncology) Royston Bake, RN as Oncology Nurse Navigator (Oncology) Care, [facility] ([place]) 01/08/2022  CHIEF COMPLAINT: Follow up metastatic pancreatic cancer, recent acute DVT/PE  SUMMARY OF ONCOLOGIC HISTORY: Oncology History  Pancreatic cancer ([place])  08/30/2021 Imaging   EXAM: CT ABDOMEN AND PELVIS WITH CONTRAST  IMPRESSION: 1. Infiltrative mass in the pancreatic tail which measures 4.7 x 4.4 cm, highly suspicious for pancreatic adenocarcinoma. 2. The mass extends into the splenic hilum with findings suspicious for involvement of the splenic parenchyma as well as abutment with possible invasion of the colonic splenic flexure. 3. Extensive omental and peritoneal nodularity with trace abdominopelvic free fluid, most consistent with carcinomatosis. 4. Right cardiophrenic adenopathy and right sided pulmonary nodules measuring up to 11 mm, concerning for metastatic disease. 5. Central filling defect within an enlarged left common femoral vein, consistent with nonocclusive deep venous thrombosis. 6. Symmetric wall thickening of the distal esophagus with reflux versus retract retained contrast in the distal esophagus. Correlate for esophagitis. 7. Hypodense hepatic lesions measure up to 1 cm in the right lower lobe appear to have been present on prior non contrasted CT and likely reflect cysts. No new discrete hepatic lesion identified.  8. Lobular uterine contour with numerous dystrophic calcifications likely reflecting uterine leiomyomas. 9.  Aortic Atherosclerosis (ICD10-I70.0).   09/06/2021 Imaging   EXAM: CT CHEST WITH CONTRAST  IMPRESSION: 1. Fissural nodularity and fissural thickening  throughout the right lung concerning for metastatic disease.   2. Additionally there are bilateral pulmonary nodules in the lower lungs, right greater than left, measuring up to 11 mm suspicious for metastatic disease.   3.  Small right pleural effusion.   4. Enlarged right cardiophrenic lymph nodes suspicious for metastatic disease.   5. Subcentimeter incidental left thyroid nodule with heterogeneous and enlarged thyroid. Recommend thyroid US. Reference: J Am Coll Radiol. 2015 Feb;12(2): 143-50   4. Pancreatic mass, peritoneal metastases and hypodense liver lesions are unchanged from 08/30/2021.   09/12/2021 Initial Biopsy   FINAL MICROSCOPIC DIAGNOSIS:   A.   OMENTAL MASS, NEEDLE CORE BIOPSY:  -    Metastatic adenocarcinoma, see Comment.   COMMENT:  The adenocarcinoma was interrogated with immunohistochemical (IHC) stains (CK7, CK20, PAX8, CDX-2, GATA-3). The malignant cells are CK7 positive, CK20 negative and have weak CDX-2 reactivity. The tumor cells are negative for GATA-3 (breast marker) and have weak/equivocal nuclear reactivity with PAX8.  This immunophenotype is non-specific, but is compatible with a pancreatic ductal primary, which is the favored primary site based on imaging studies. The controls are satisfactory.    09/12/2021 Cancer Staging   Staging form: Exocrine Pancreas, AJCC 8th Edition - Clinical stage from 09/12/2021: Stage IV (cT3, cN0, pM1) - Signed by Truitt Merle, MD on 10/30/2021 Stage prefix: Initial diagnosis Total positive nodes: 0   09/15/2021 Initial Diagnosis   Pancreatic cancer ([place])   09/25/2021 -  Chemotherapy   Patient is on Treatment Plan : PANCREATIC Abraxane / Gemcitabine D1,8, q21d      Genetic Testing   Negative genetic testing. No pathogenic variants identified on the [facility] BRCA1/2 STAT+ CustomNext+RNA panel. The report date is 10/02/2021.  The CustomNext-Cancer+RNAinsight panel offered by [facility] includes sequencing and rearrangement  analysis for the following  47 genes:  APC, ATM, AXIN2, BARD1, BMPR1A, BRCA1, BRCA2, BRIP1, CDH1, CDK4, CDKN2A, CHEK2, DICER1, EPCAM, GREM1, HOXB13, MEN1, MLH1, MSH2, MSH3, MSH6, MUTYH, NBN, NF1, NF2, NTHL1, PALB2, PMS2, POLD1, POLE, PTEN, RAD51C, RAD51D, RECQL, RET, SDHA, SDHAF2, SDHB, SDHC, SDHD, SMAD4, SMARCA4, STK11, TP53, TSC1, TSC2, and VHL.  RNA data is routinely analyzed for use in variant interpretation for all genes.      CURRENT THERAPY:   INTERVAL HISTORY: Kathryn Kent returns for follow up as scheduled. Last seen by me 12/25/21 for routine f/up prior to chemo. She was sent to ED for hypoxia, found to have acute DVT and bilateral PE. Started on hepatin drip and transitioned to oral AC. Discharged to SNF on 12/26/21. CA 19-9 up to 2709 at last visit.   Today she presents with her niece, Kathryn Kent, still in a wheelchair.  Her breathing has improved, denies dyspnea.  She is not on supplemental oxygen.  She is tolerating Xarelto without bleeding.  She does exercises/PT at [facility] and is independent with ADLs except showering.  When not in PT she is mostly sedentary in her room.  She might leave this Thursday if they deem she is strong enough.  She lives at home, has not wanted to move in with family historically.  Her abdomen is sore and firm but not painful.  She has nausea and vomiting with low appetite.  She thinks she is getting nausea meds at [facility] but not sure.  Bowels moving with MiraLAX.  All other systems were reviewed with the patient and are negative.  MEDICAL HISTORY:  Past Medical History:  Diagnosis Date   Anemia    Coronary artery disease cardiologist---dr berry   cardiac cath --   10/16/05 revealing noncritical CAD   Cyst of left kidney    small   Diverticulosis    Heart murmur    History of colon polyps    History of kidney stones    Hyperlipidemia    Hypertension    Leiomyoma of uterus    Liver cyst    small   OSA on CPAP    Osteoarthritis    pancreatic ca  63/1497   Periumbilical hernia    Moderate   PMB (postmenopausal bleeding)    Vulvar cyst    Wears dentures    upper    SURGICAL HISTORY: Past Surgical History:  Procedure Laterality Date   APPENDECTOMY  age 60   and removal cyst   CARDIAC CATHETERIZATION  10-16-2005   dr berry   nonobstructive cad w/ mid segmental LAD 30-40% ,  normal LVF   COLONOSCOPY  06/24/2017   CYSTOSCOPY W/ URETERAL STENT PLACEMENT Right 01/03/2017   Procedure: CYSTOSCOPY WITH RETROGRADE PYELOGRAM/ URETEROSCOPY/ STONE BASKETRY/URETERAL STENT PLACEMENT;  Surgeon: Cleon Gustin, MD;  Location: [facility];  Service: Urology;  Laterality: Right;   DILATATION & CURETTAGE/HYSTEROSCOPY WITH MYOSURE N/A 12/30/2018   Procedure: [place];  Surgeon: Megan Salon, MD;  Location: [facility];  Service: Gynecology;  Laterality: N/A;   FOOT ARTHRODESIS Bilateral 1987   great toe's   great toe surgery Bilateral    HOLMIUM LASER APPLICATION Right 0/26/3785   Procedure: HOLMIUM LASER APPLICATION;  Surgeon: Cleon Gustin, MD;  Location: [facility];  Service: Urology;  Laterality: Right;   IR IMAGING GUIDED PORT INSERTION  09/22/2021   TOTAL KNEE ARTHROPLASTY Right 01/20/2018   Procedure: RIGHT TOTAL KNEE ARTHROPLASTY;  Surgeon: Gaynelle Arabian,  MD;  Location: [facility];  Service: Orthopedics;  Laterality: Right;  with block    I have reviewed the social history and family history with the patient and they are unchanged from previous note.  ALLERGIES:  is allergic to lipitor [atorvastatin].  MEDICATIONS:  Current Outpatient Medications  Medication Sig Dispense Refill   mirtazapine (REMERON) 7.5 MG tablet Take 1 tablet (7.5 mg total) by mouth at bedtime. 30 tablet 1   amLODipine (NORVASC) 5 MG tablet Take 1 tablet (5 mg total) by mouth daily.     dicyclomine (BENTYL) 20 MG tablet Take 1 tablet (20 mg total) by mouth 2 (two) times  daily. 20 tablet 0   ezetimibe (ZETIA) 10 MG tablet Take 10 mg by mouth daily.     lidocaine-prilocaine (EMLA) cream Apply to affected area once (Patient taking differently: 1 Application daily as needed (access port).) 30 g 3   montelukast (SINGULAIR) 10 MG tablet Take 1 tablet by mouth at bedtime.     Multiple Vitamin (MULTIVITAMIN) tablet Take 1 tablet by mouth daily.     ondansetron (ZOFRAN) 8 MG tablet Take 1 tablet (8 mg total) by mouth 2 (two) times daily as needed (Nausea or vomiting). 30 tablet 1   polyethylene glycol (MIRALAX / GLYCOLAX) 17 g packet Take 17 g by mouth 2 (two) times daily. 14 each 0   prochlorperazine (COMPAZINE) 10 MG tablet Take 1 tablet (10 mg total) by mouth every 6 (six) hours as needed (Nausea or vomiting). 30 tablet 1   RIVAROXABAN (XARELTO) VTE STARTER PACK (15 & 20 MG) Follow package directions: Take one 17m tablet by mouth twice a day. On day 22, switch to one 279mtablet once a day. Take with food. 51 each 0   senna-docusate (SENOKOT-S) 8.6-50 MG tablet Take 2 tablets by mouth 2 (two) times daily.     simvastatin (ZOCOR) 40 MG tablet Take 20 mg by mouth daily.     No current facility-administered medications for this visit.    PHYSICAL EXAMINATION: ECOG PERFORMANCE STATUS: 2 - Symptomatic, <50% confined to bed  Vitals:   01/08/22 0942  BP: (!) 143/90  Pulse: 90  Resp: 16  Temp: 97.6 F (36.4 C)  SpO2: 98%   Filed Weights   01/08/22 0942  Weight: 178 lb 11.2 oz (81.1 kg)    GENERAL:alert, no distress and comfortable SKIN: no rash  EYES: sclera clear NECK: without mass LUNGS: clear with normal breathing effort HEART: regular rate & rhythm, no lower extremity edema ABDOMEN:abdomen non-tender, slightly firm and tender throughout, and normal bowel sounds NEURO: alert & oriented x 3 with fluent speech, no focal deficits. In wheelchair PAC without erythema    LABORATORY DATA:  I have reviewed the data as listed    Latest Ref Rng & Units  01/08/2022    9:03 AM 12/30/2021    5:14 AM 12/28/2021    5:17 AM  CBC  WBC 4.0 - 10.5 K/uL 6.9  5.8  5.2   Hemoglobin 12.0 - 15.0 g/dL 12.0  11.1  10.7   Hematocrit 36.0 - 46.0 % 36.0  34.7  33.5   Platelets 150 - 400 K/uL 188  175  146         Latest Ref Rng & Units 01/08/2022    9:03 AM 12/30/2021    5:14 AM 12/26/2021    5:11 AM  CMP  Glucose 70 - 99 mg/dL 133  143  143   BUN 8 - 23 mg/dL 15  11  20   Creatinine 0.44 - 1.00 mg/dL 0.65  0.67  0.63   Sodium 135 - 145 mmol/L 136  136  140   Potassium 3.5 - 5.1 mmol/L 3.6  3.9  4.3   Chloride 98 - 111 mmol/L 98  100  104   CO2 22 - 32 mmol/L 32  27  27   Calcium 8.9 - 10.3 mg/dL 9.8  9.4  9.5   Total Protein 6.5 - 8.1 g/dL 7.5  7.2  7.3   Total Bilirubin 0.3 - 1.2 mg/dL 0.7  0.7  0.8   Alkaline Phos 38 - 126 U/L 64  64  68   AST 15 - 41 U/L _0 ALT 0 - 44 U/L 15  36  23       RADIOGRAPHIC STUDIES: I have personally reviewed the radiological images as listed and agreed with the findings in the report. No results found.   ASSESSMENT & PLAN: Kathryn Kent is a 86 y.o. female with      1. Metastatic Pancreatic Cancer to peritoneum and lungs, stage IV  -presented to PCP with rectal, vaginal, and pelvic area pain, as well as positive Hemoccult stool test. CT AP on 08/30/21 showed 4.7 cm pancreatic tail mass extending into splenic hilum and suspicious for involvement of splenic parenchyma and abutment with possible invasion of colonic splenic flexure; extensive omental and peritoneal nodularity; right cardiophrenic adenopathy and right-sided pulmonary nodules. -baseline tumor markers on 09/04/21 were all elevated-- CEA of 24.71, CA 125 of 67.7, and CA 19.9 of 1,208. -staging chest CT on 09/06/21 showed: nodularity and thickening throughout right lung; bilateral pulmonary nodules in lower lungs, R>L, up to 11 mm; small right pleural effusion; enlarged right cardiophrenic lymph nodes. -biopsy of omental mass on 09/12/21 confirmed  metastatic adenocarcinoma, compatible with pancreatic primary based on imaging. -she began first cycle chemo with single agent gemcitabine on 09/25/21. She tolerated well overall. Abraxane was added with cycle 2. She is now receiving treatment every 2 weeks. She is tolerating very well and her abdominal pain has resolved  -Restaging CT AP 12/09/2021 showed stable disease. CA 19-9 fluctuates.  Began rising again on 12/25/2021 -treatment on hold starting 7/3 for acute PE (hospitalized 7/3 - 7/8), she is recovering  -Kathryn Kent appears improved, no hypoxia or dyspnea.  She is engaged in PT at rehab, but still weak overall.  -We reviewed symptom management.  My nurse called [facility] who confirms that she has been getting Zofran every 8 hours as needed for nausea.  I recommend to add Compazine q6h for breakthrough nausea.  She will receive IV fluids plus Zofran today in clinic.  I sent in a prescription for mirtazapine for low appetite.  She can try Tylenol for pain as needed, if not strong enough she will let us know  -We reviewed her hospital course including Doppler, CTA, and labs.  CTA showed, in addition to bilateral PE, enlarging right pleural effusion and mildly increased size of small pulmonary nodules.  CA 19-9 is rising.  She has new/worsening abdominal pain and nausea/vomiting, decreased appetite.  Overall clinical picture is consistent with disease progression -We had a lengthy discussion about her overall condition in the setting of recent PE, metastatic pancreas cancer recently progressed, and goals of care. -Patient seen with Dr. Burr Medico who discussed stopping gemcitabine and Abraxane due to disease progression.  She is not a candidate for further intensive IV chemo due to  recent PE and weakness. Dr. Burr Medico discussed possible second line treatment with oral Xeloda if/when Kathryn Kent recovers.  She discussed the low response rate and potential side effects. -We also discussed palliative care or hospice alone,  which is reasonable at this point. -after a long discussion, ultimately Kathryn Kent decided to proceed with hospice referral. Her Niece Kathryn Kent is present for the discussion and providers assistance/support. We will call to see which service provides coverage for her area.   -cancel future appts, but will still see her if needed in the future  2.  Bilateral DVT and acute PE -She presented 12/25/2021 for routine visit and chemo, found to be hypoxic O2 sat 86% on RA on ambulation, with tachypnea and tachycardia -She was sent to ED from clinic and admitted, found to have extensive bilateral lower extremity DVTs (left age-indeterminate, right acute) and bilateral pulmonary emboli with CT evidence of right heart strain, consistent with at least submassive PE.  Visualized right pulmonary nodules and pleural effusion had slightly increased -She was started on heparin drip and eventually transition to oral anticoagulation, tolerating Xarelto -Her breathing has improved, hypoxia resolved.  Not required supplemental oxygen -We discussed this is likely secondary to metastatic pancreas cancer and chemotherapy -recovering in [facility] rehab, may discharge home 7/21 if appropriate -continue xarelto   3. Weight Loss, Constipation, Pain -secondary to #1 -chart review shows weight loss of about 20 lbs from 10/2020 - 08/2021. -Continue follow-up with dietitian -she is on tramadol for abdominal, armpit, and groin pains.  Pain resolved after starting chemo -she was prescribed bentyl in the ED on 10/18/21 for constipation. -Constipation stable on chemo, well managed with MiraLAX as needed  -We will start mirtazapine p.o. nightly, prescription sent -She has recurrent abdominal discomfort, can try Tylenol as needed or let us know if not strong enough.  4. Thyroid Nodule -incidental subcentimeter left thyroid nodule with heterogeneous and enlarged thyroid on chest CT 09/06/21.   5. Goal of care discussion -Treatment goal of  chemo is palliative  -Lengthy goals of care discussion today, see #1.  Will stop chemo and refer to hospice -emotional support provided -We discussed her CODE STATUS.  She elected to be DNR at hospital admission on 7/3   PLAN: [facility] course 7/3 - 7/8 and today's labs reviewed -Discontinue gem/abraxane due to disease progression -Hospice referral, [facility] -Rx: mirtazapine po qHS -My nurse spoke to nurse at [facility], continue Zofran q. 8 as needed, add Compazine q. 6 for breakthrough nausea. Tylenol for pain, or call us if not enough  -IVF + zofran today -Cancel appointments, f/up open/as needed in the future -Patient seen with Dr. Burr Medico -cc note to cardiology to let them know, she has f/up 7/21   Orders Placed This Encounter  Procedures   Ambulatory referral to Hospice    Referral Priority:   Routine    Referral Type:   Consultation    Referral Reason:   Specialty Services Required    Requested Specialty:   Hospice Services    Number of Visits Requested:   1   All questions were answered. The patient knows to call the clinic with any problems, questions or concerns. No barriers to learning were detected.      Alla Feeling, NP 01/08/22    Addendum  I have seen the patient, examined her. I agree with the assessment and and plan and have edited the notes.   Kathryn Kent was  recently hospitalized for PE, was discharged to rehab.  Her performance status had deteriorated.  She has low appetite and fatigue.  I have reviewed her CT chest which was taken during her admission, and unfortunately showed cancer progression with enlarging lung nodule and increased pleural effusion.  Her tumor marker has increased significantly also.  We discussed next line treatment options, she is clearly not a candidate for intensive chemotherapy, the only option would be oral capecitabine, he has low response rate.  Given her advanced age, limited social  support, and her preference to preserve her quality of life, I recommend palliative care and hospice, discussed the service in detail.  After lengthy discussion, she agrees to meet hospice at home. We will reach out to her local hospice program. We will give her IVF and antiemetics today, and cancel chemo.  All questions were answered.  Plan was discussed with her niece in detail, she also agrees.  Truitt Merle MD  01/08/2022

## 2022-01-08 ENCOUNTER — Other Ambulatory Visit: Payer: Self-pay

## 2022-01-08 ENCOUNTER — Inpatient Hospital Stay: Payer: Medicare Other

## 2022-01-08 ENCOUNTER — Inpatient Hospital Stay: Payer: Medicare Other | Admitting: Nurse Practitioner

## 2022-01-08 ENCOUNTER — Telehealth: Payer: Self-pay

## 2022-01-08 ENCOUNTER — Encounter: Payer: Self-pay | Admitting: Nurse Practitioner

## 2022-01-08 VITALS — BP 143/90 | HR 90 | Temp 97.6°F | Resp 16 | Ht 64.0 in | Wt 178.7 lb

## 2022-01-08 DIAGNOSIS — J90 Pleural effusion, not elsewhere classified: Secondary | ICD-10-CM | POA: Diagnosis not present

## 2022-01-08 DIAGNOSIS — E041 Nontoxic single thyroid nodule: Secondary | ICD-10-CM | POA: Diagnosis not present

## 2022-01-08 DIAGNOSIS — R0682 Tachypnea, not elsewhere classified: Secondary | ICD-10-CM | POA: Diagnosis not present

## 2022-01-08 DIAGNOSIS — Z95828 Presence of other vascular implants and grafts: Secondary | ICD-10-CM

## 2022-01-08 DIAGNOSIS — R000 Tachycardia, unspecified: Secondary | ICD-10-CM | POA: Diagnosis not present

## 2022-01-08 DIAGNOSIS — C252 Malignant neoplasm of tail of pancreas: Secondary | ICD-10-CM

## 2022-01-08 DIAGNOSIS — I2609 Other pulmonary embolism with acute cor pulmonale: Secondary | ICD-10-CM

## 2022-01-08 DIAGNOSIS — I10 Essential (primary) hypertension: Secondary | ICD-10-CM | POA: Diagnosis not present

## 2022-01-08 DIAGNOSIS — C7802 Secondary malignant neoplasm of left lung: Secondary | ICD-10-CM | POA: Diagnosis not present

## 2022-01-08 DIAGNOSIS — D649 Anemia, unspecified: Secondary | ICD-10-CM | POA: Diagnosis not present

## 2022-01-08 DIAGNOSIS — C7801 Secondary malignant neoplasm of right lung: Secondary | ICD-10-CM | POA: Diagnosis not present

## 2022-01-08 DIAGNOSIS — C786 Secondary malignant neoplasm of retroperitoneum and peritoneum: Secondary | ICD-10-CM | POA: Diagnosis not present

## 2022-01-08 LAB — CBC WITH DIFFERENTIAL/PLATELET
Abs Immature Granulocytes: 0.02 10*3/uL (ref 0.00–0.07)
Basophils Absolute: 0 10*3/uL (ref 0.0–0.1)
Basophils Relative: 1 %
Eosinophils Absolute: 0.1 10*3/uL (ref 0.0–0.5)
Eosinophils Relative: 1 %
HCT: 36 % (ref 36.0–46.0)
Hemoglobin: 12 g/dL (ref 12.0–15.0)
Immature Granulocytes: 0 %
Lymphocytes Relative: 14 %
Lymphs Abs: 0.9 10*3/uL (ref 0.7–4.0)
MCH: 30.4 pg (ref 26.0–34.0)
MCHC: 33.3 g/dL (ref 30.0–36.0)
MCV: 91.1 fL (ref 80.0–100.0)
Monocytes Absolute: 0.6 10*3/uL (ref 0.1–1.0)
Monocytes Relative: 9 %
Neutro Abs: 5.3 10*3/uL (ref 1.7–7.7)
Neutrophils Relative %: 75 %
Platelets: 188 10*3/uL (ref 150–400)
RBC: 3.95 MIL/uL (ref 3.87–5.11)
RDW: 14.9 % (ref 11.5–15.5)
WBC: 6.9 10*3/uL (ref 4.0–10.5)
nRBC: 0 % (ref 0.0–0.2)

## 2022-01-08 LAB — COMPREHENSIVE METABOLIC PANEL
ALT: 15 U/L (ref 0–44)
AST: 15 U/L (ref 15–41)
Albumin: 3.7 g/dL (ref 3.5–5.0)
Alkaline Phosphatase: 64 U/L (ref 38–126)
Anion gap: 6 (ref 5–15)
BUN: 15 mg/dL (ref 8–23)
CO2: 32 mmol/L (ref 22–32)
Calcium: 9.8 mg/dL (ref 8.9–10.3)
Chloride: 98 mmol/L (ref 98–111)
Creatinine, Ser: 0.65 mg/dL (ref 0.44–1.00)
GFR, Estimated: >60 mL/min (ref >60)
Glucose, Bld: 133 mg/dL — ABNORMAL HIGH (ref 70–99)
Potassium: 3.6 mmol/L (ref 3.5–5.1)
Sodium: 136 mmol/L (ref 135–145)
Total Bilirubin: 0.7 mg/dL (ref 0.3–1.2)
Total Protein: 7.5 g/dL (ref 6.5–8.1)

## 2022-01-08 MED ORDER — HEPARIN SOD (PORK) LOCK FLUSH 100 UNIT/ML IV SOLN
500.0000 [IU] | Freq: Once | INTRAVENOUS | Status: AC
  Administered 2022-01-08: 500 [IU]

## 2022-01-08 MED ORDER — MIRTAZAPINE 7.5 MG PO TABS: 7.5000 mg | ORAL_TABLET | Freq: Every day | ORAL | 1 refills | Status: DC

## 2022-01-08 MED ORDER — SODIUM CHLORIDE 0.9 % IV SOLN: Freq: Once | INTRAVENOUS | Status: AC

## 2022-01-08 MED ORDER — SODIUM CHLORIDE 0.9% FLUSH
10.0000 mL | Freq: Once | INTRAVENOUS | Status: AC
  Administered 2022-01-08: 10 mL

## 2022-01-08 MED ORDER — ONDANSETRON HCL 4 MG/2ML IJ SOLN
8.0000 mg | Freq: Once | INTRAMUSCULAR | Status: AC
  Administered 2022-01-08: 8 mg via INTRAVENOUS
  Filled 2022-01-08: qty 4

## 2022-01-08 MED ORDER — SODIUM CHLORIDE 0.9 % IV SOLN: 8.0000 mg | Freq: Once | INTRAVENOUS | Status: DC

## 2022-01-08 MED ORDER — PROCHLORPERAZINE MALEATE 10 MG PO TABS: 10.0000 mg | ORAL_TABLET | Freq: Four times a day (QID) | ORAL | 1 refills | Status: DC | PRN

## 2022-01-08 NOTE — Progress Notes (Signed)
Nutrition Follow-up:  Patient with pancreatic cancer.  Noted recent hospital admission and discharged to SNF  Met with patient during infusion. Says that she has been feeling nauseated and doctor says she has progression.  She says that she is going to hospice and no longer going to do treatment.  Says that she is getting fluids and nausea medication today, no more chemo.     INTERVENTION:  Eat what she is able to tolerate. RD available if needed in the future.     NEXT VISIT: no follow-up  Heidie Krall B. Zenia Resides, [place], [place] Registered Dietitian [phone]

## 2022-01-08 NOTE — Patient Instructions (Signed)
Rehydration, Adult Rehydration is the replacement of body fluids, salts, and minerals (electrolytes) that are lost during dehydration. Dehydration is when there is not enough water or other fluids in the body. This happens when you lose more fluids than you take in. Common causes of dehydration include: Not drinking enough fluids. This can occur when you are ill or doing activities that require a lot of energy, especially in hot weather. Conditions that cause loss of water or other fluids, such as diarrhea, vomiting, sweating, or urinating a lot. Other illnesses, such as fever or infection. Certain medicines, such as those that remove excess fluid from the body (diuretics). Symptoms of mild or moderate dehydration may include thirst, dry lips and mouth, and dizziness. Symptoms of severe dehydration may include increased heart rate, confusion, fainting, and not urinating. For severe dehydration, you may need to get fluids through an IV at the hospital. For mild or moderate dehydration, you can usually rehydrate at home by drinking certain fluids as told by your health care provider. What are the risks? Generally, rehydration is safe. However, taking in too much fluid (overhydration) can be a problem. This is rare. Overhydration can cause an electrolyte imbalance, kidney failure, or a decrease in salt (sodium) levels in the body. Supplies needed You will need an oral rehydration solution (ORS) if your health care provider tells you to use one. This is a drink to treat dehydration. It can be found in pharmacies and retail stores. How to rehydrate Fluids Follow instructions from your health care provider for rehydration. The kind of fluid and the amount you should drink depend on your condition. In general, you should choose drinks that you prefer. If told by your health care provider, drink an ORS. Make an ORS by following instructions on the package. Start by drinking small amounts, about  cup (120  mL) every 5-10 minutes. Slowly increase how much you drink until you have taken the amount recommended by your health care provider. Drink enough clear fluids to keep your urine pale yellow. If you were told to drink an ORS, finish it first, then start slowly drinking other clear fluids. Drink fluids such as: Water. This includes sparkling water and flavored water. Drinking only water can lead to having too little sodium in your body (hyponatremia). Follow the advice of your health care provider. Water from ice chips you suck on. Fruit juice with water you add to it (diluted). Sports drinks. Hot or cold herbal teas. Broth-based soups. Milk or milk products. Food Follow instructions from your health care provider about what to eat while you rehydrate. Your health care provider may recommend that you slowly begin eating regular foods in small amounts. Eat foods that contain a healthy balance of electrolytes, such as bananas, oranges, potatoes, tomatoes, and spinach. Avoid foods that are greasy or contain a lot of sugar. In some cases, you may get nutrition through a feeding tube that is passed through your nose and into your stomach (nasogastric tube, or NG tube). This may be done if you have uncontrolled vomiting or diarrhea. Beverages to avoid  Certain beverages may make dehydration worse. While you rehydrate, avoid drinking alcohol. How to tell if you are recovering from dehydration You may be recovering from dehydration if: You are urinating more often than before you started rehydrating. Your urine is pale yellow. Your energy level improves. You vomit less frequently. You have diarrhea less frequently. Your appetite improves or returns to normal. You feel less dizzy or less light-headed.   Your skin tone and color start to look more normal. Follow these instructions at home: Take over-the-counter and prescription medicines only as told by your health care provider. Do not take sodium  tablets. Doing this can lead to having too much sodium in your body (hypernatremia). Contact a health care provider if: You continue to have symptoms of mild or moderate dehydration, such as: Thirst. Dry lips. Slightly dry mouth. Dizziness. Dark urine or less urine than normal. Muscle cramps. You continue to vomit or have diarrhea. Get help right away if you: Have symptoms of dehydration that get worse. Have a fever. Have a severe headache. Have been vomiting and the following happens: Your vomiting gets worse or does not go away. Your vomit includes blood or green matter (bile). You cannot eat or drink without vomiting. Have problems with urination or bowel movements, such as: Diarrhea that gets worse or does not go away. Blood in your stool (feces). This may cause stool to look black and tarry. Not urinating, or urinating only a small amount of very dark urine, within 6-8 hours. Have trouble breathing. Have symptoms that get worse with treatment. These symptoms may represent a serious problem that is an emergency. Do not wait to see if the symptoms will go away. Get medical help right away. Call your local emergency services (911 in the [place] Do not drive yourself to the hospital. Summary Rehydration is the replacement of body fluids and minerals (electrolytes) that are lost during dehydration. Follow instructions from your health care provider for rehydration. The kind of fluid and amount you should drink depend on your condition. Slowly increase how much you drink until you have taken the amount recommended by your health care provider. Contact your health care provider if you continue to show signs of mild or moderate dehydration. This information is not intended to replace advice given to you by your health care provider. Make sure you discuss any questions you have with your health care provider. Document Revised: 08/12/2019 Document Reviewed: 06/22/2019 [employer] Patient  Education  2023 [employer]  

## 2022-01-08 NOTE — Telephone Encounter (Signed)
This nurse spoke with nurse at [facility] by the name of Monett and made aware that two prescriptions were faxed in to [facility] for Mirtazapine and Compazine.  Explained to nurse the order instructions for the patient.  Nurse acknowledged understanding.  Office notes will also be faxed over to the nursing care facility.  No further concerns at this time.

## 2022-01-09 ENCOUNTER — Telehealth: Payer: Self-pay

## 2022-01-09 NOTE — Telephone Encounter (Signed)
Called patient to advise per K. Lawrence patient did not need to be seen. Appointment Canceled.

## 2022-01-10 LAB — CANCER ANTIGEN 19-9: CA 19-9: 4361 U/mL — ABNORMAL HIGH (ref 0–35)

## 2022-01-12 ENCOUNTER — Ambulatory Visit: Payer: Medicare Other | Admitting: Adult Health

## 2022-01-15 ENCOUNTER — Other Ambulatory Visit: Payer: Self-pay

## 2022-01-18 ENCOUNTER — Other Ambulatory Visit: Payer: Self-pay | Admitting: Hematology

## 2022-01-18 DIAGNOSIS — E876 Hypokalemia: Secondary | ICD-10-CM

## 2022-01-18 NOTE — Telephone Encounter (Signed)
Pt now on hospice and prescription was d/c 06/20/203.  Pt's last K+ from 01/08/2022 was WNL.

## 2022-01-22 ENCOUNTER — Ambulatory Visit: Payer: Medicare Other

## 2022-01-22 ENCOUNTER — Other Ambulatory Visit: Payer: Medicare Other

## 2022-01-22 ENCOUNTER — Ambulatory Visit: Payer: Medicare Other | Admitting: Hematology

## 2022-01-23 ENCOUNTER — Other Ambulatory Visit: Payer: Self-pay

## 2022-02-05 ENCOUNTER — Ambulatory Visit: Payer: Medicare Other

## 2022-02-05 ENCOUNTER — Other Ambulatory Visit: Payer: Medicare Other

## 2022-02-05 ENCOUNTER — Ambulatory Visit: Payer: Medicare Other | Admitting: Hematology

## 2022-02-23 ENCOUNTER — Other Ambulatory Visit: Payer: Self-pay

## 2022-02-23 DEATH — deceased

## 2022-09-12 ENCOUNTER — Other Ambulatory Visit: Payer: Self-pay | Admitting: Hematology

## 2022-09-12 DIAGNOSIS — C252 Malignant neoplasm of tail of pancreas: Secondary | ICD-10-CM

## 2023-04-03 IMAGING — MG MM BREAST LOCALIZATION CLIP
4 series · 4 of 12 positions shown · non-contrast
Comparison: Previous exam(s).

CLINICAL DATA: Evaluate post biopsy marker clip placement.

EXAM:
3D DIAGNOSTIC LEFT MAMMOGRAM POST ULTRASOUND BIOPSY

[L ML synth-2D]
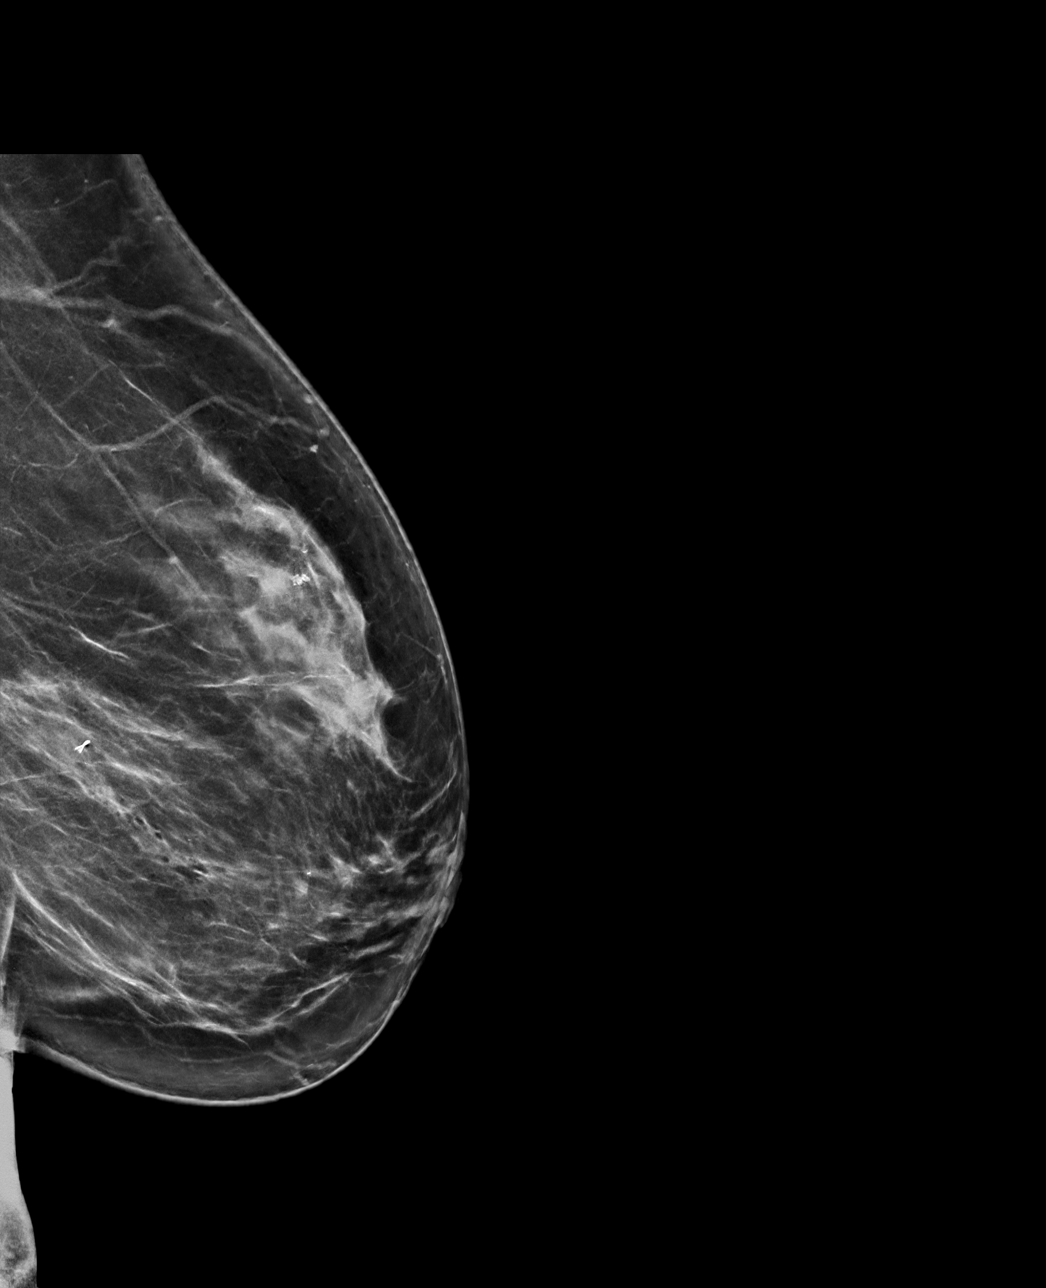

[L CC synth-2D]
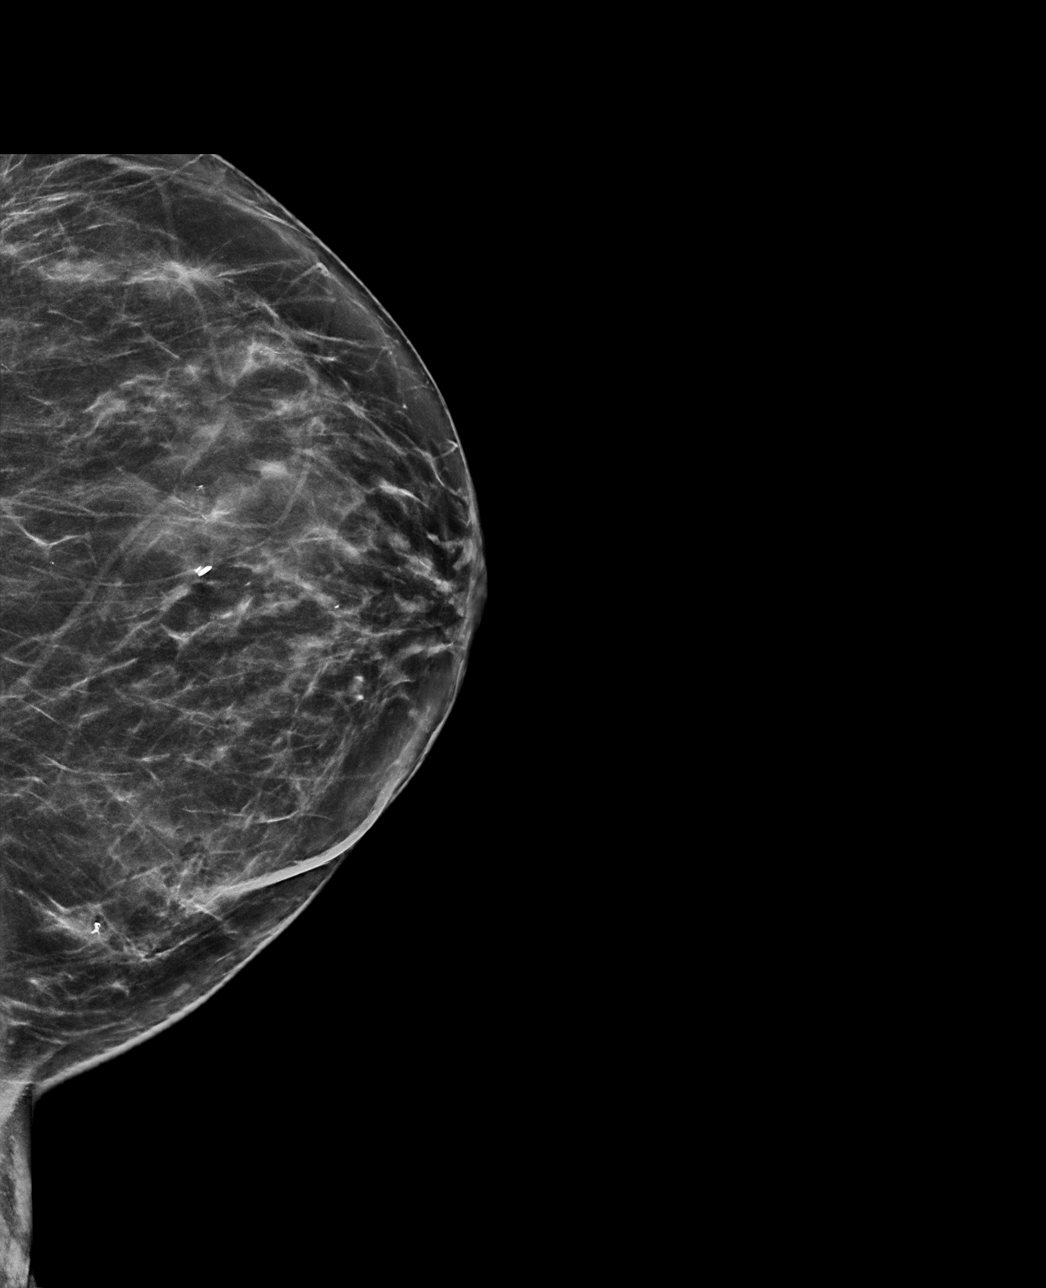

[L ML tomo · tomo slice 41/82.0]
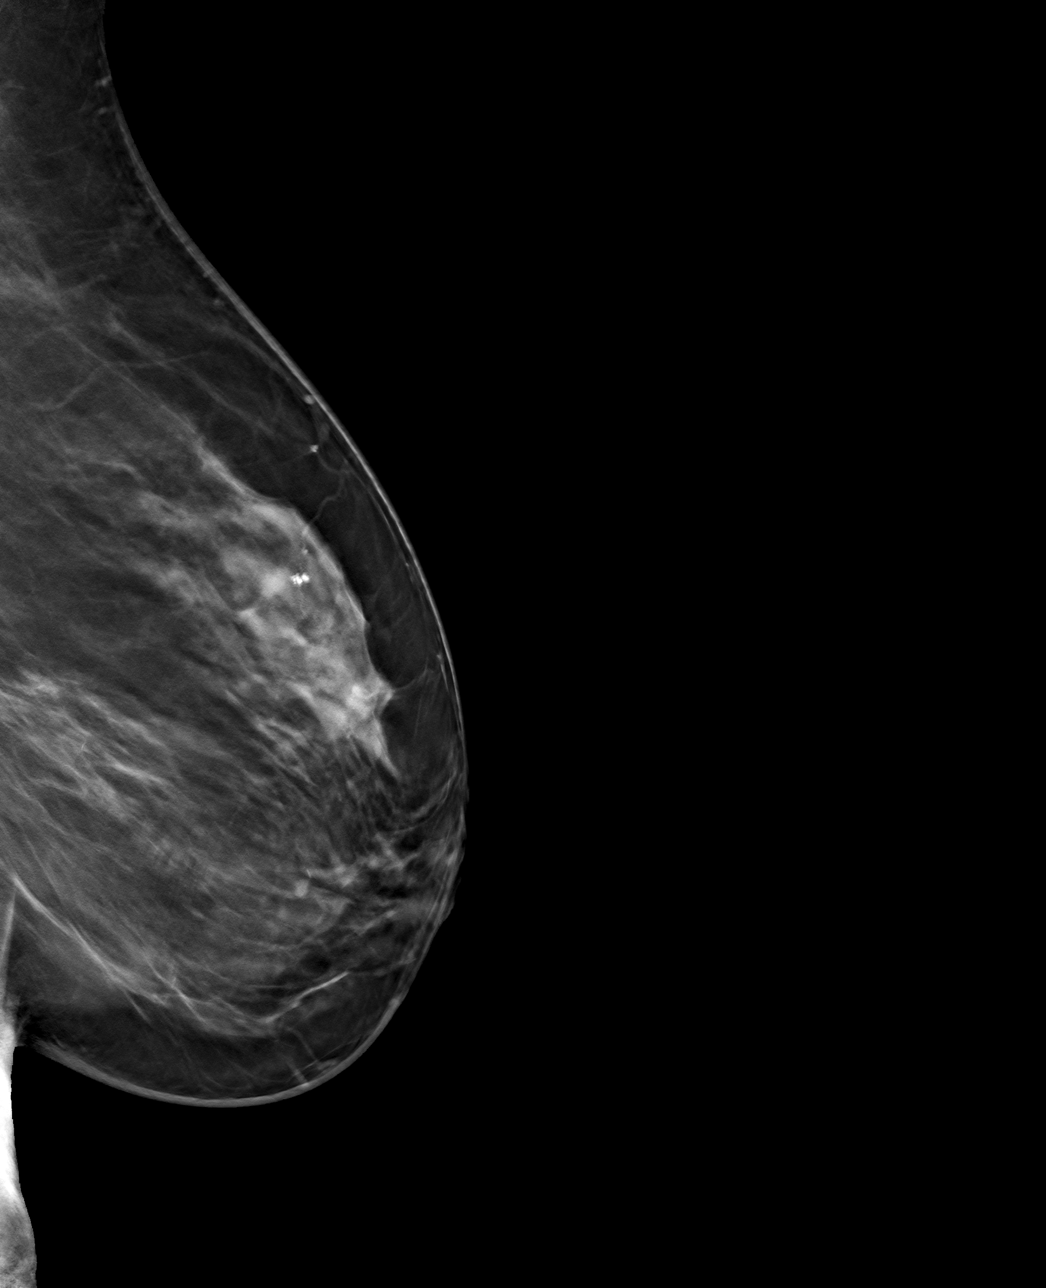

[L CC tomo · tomo slice 35/70.0]
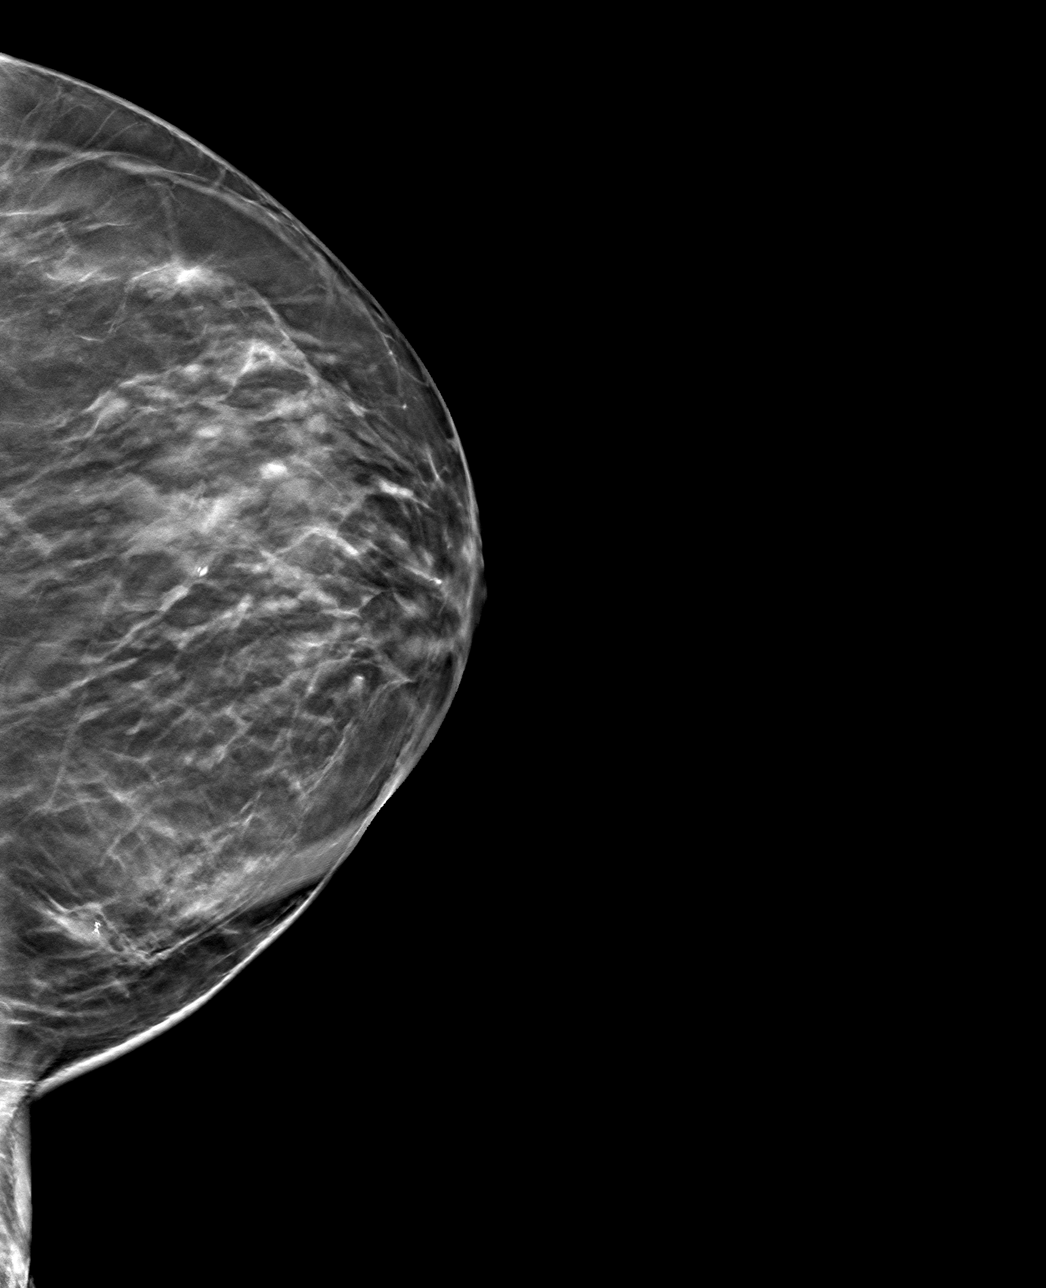

[4 of 12 positions shown; findings below may reference images not displayed]

FINDINGS: 3D Mammographic images were obtained following ultrasound guided
biopsy of a small medial left breast mass. The biopsy marking clip
is in expected position at the site of biopsy.
IMPRESSION: Appropriate positioning of the ribbon shaped biopsy marking clip at
the site of biopsy in the within the residual mass in medial left
breast.

Final Assessment: Post Procedure Mammograms for Marker Placement

## 2023-04-03 IMAGING — US US BREAST BX W LOC DEV 1ST LESION IMG BX SPEC US GUIDE*L*
1 series · 12 of 13 positions shown · non-contrast
Comparison: Previous exam(s).
COMPARISON: Previous exam(s).

Addendum:
CLINICAL DATA: Patient presents for ultrasound-guided core needle
biopsy of a small medial left breast mass.

EXAM:
ULTRASOUND GUIDED LEFT BREAST CORE NEEDLE BIOPSY

[Series 1: us breast bx w loc dev 1st lesion img bx spec us g · 0.06mm/px · 12 of 13 slices shown]
[im 1/13]
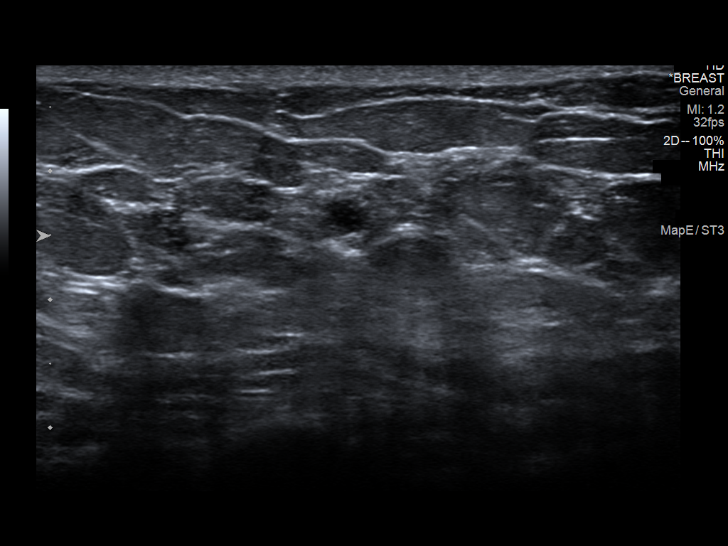
[im 2/13]
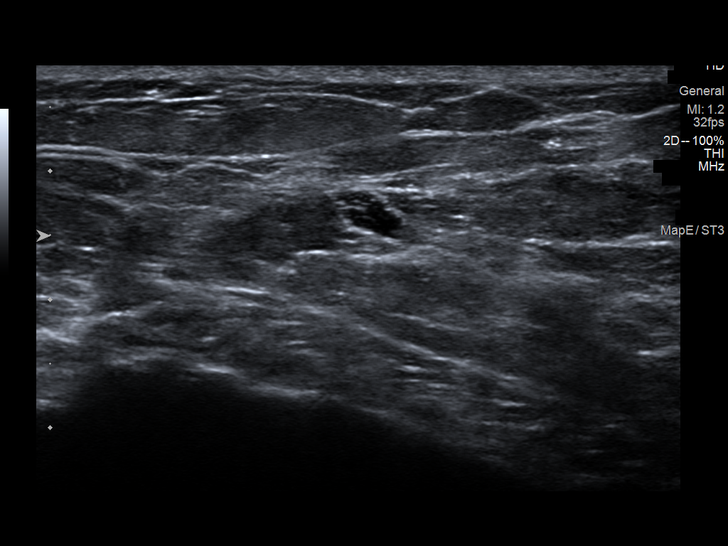
[im 3/13]
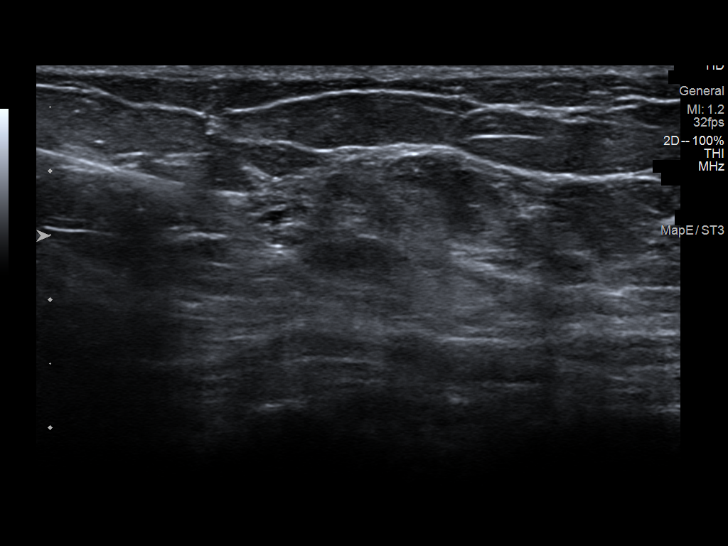
[im 4/13]
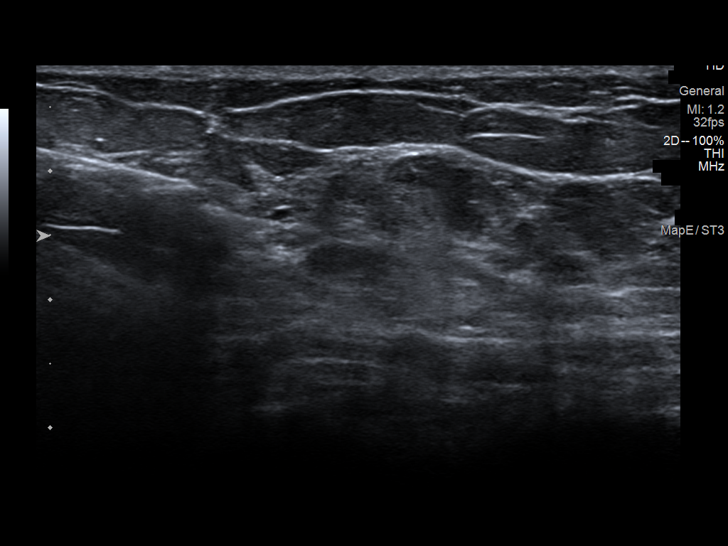
[im 5/13]
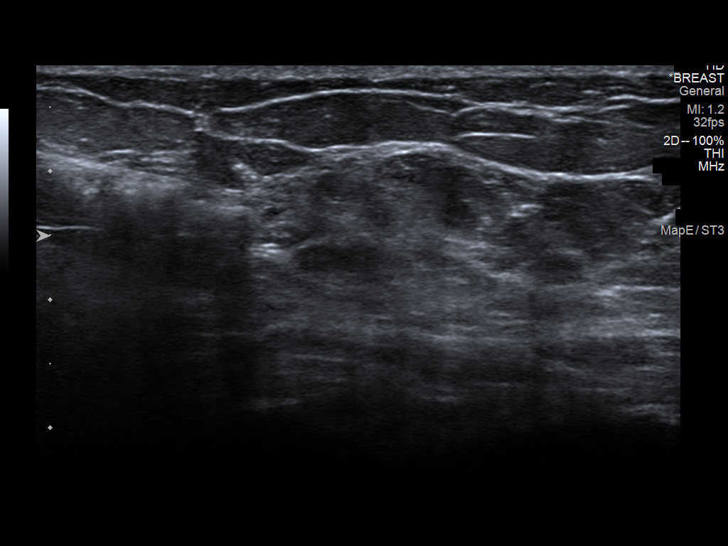
[im 6/13]
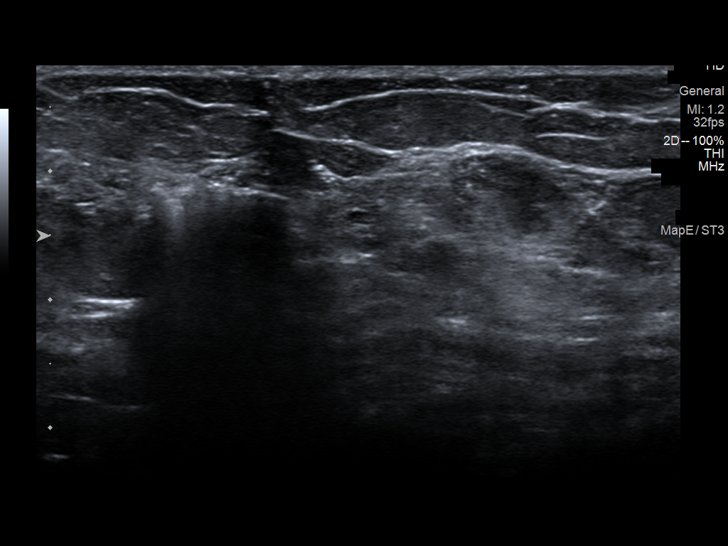
[im 8/13]
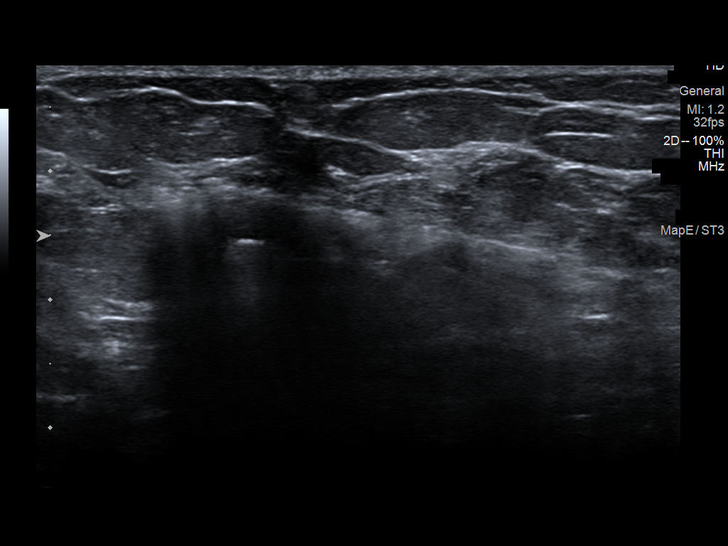
[im 9/13]
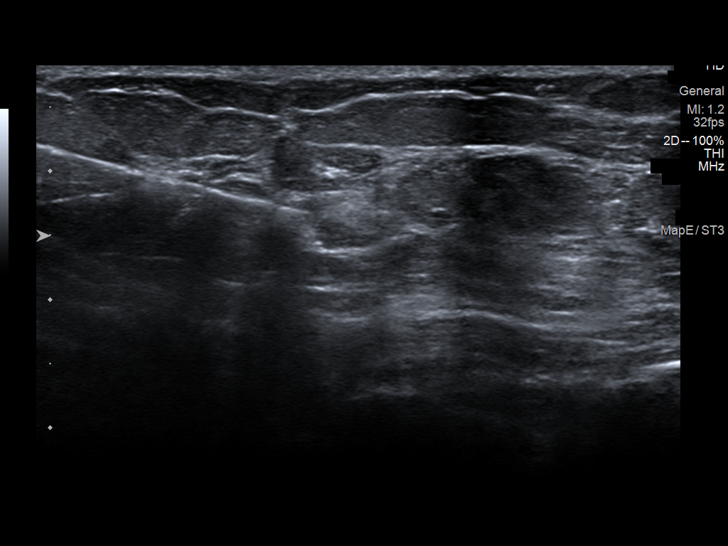
[im 10/13]
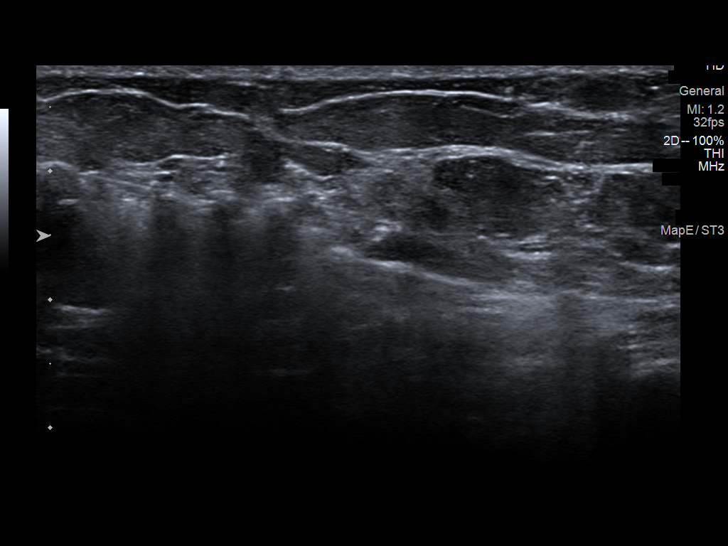
[im 11/13]
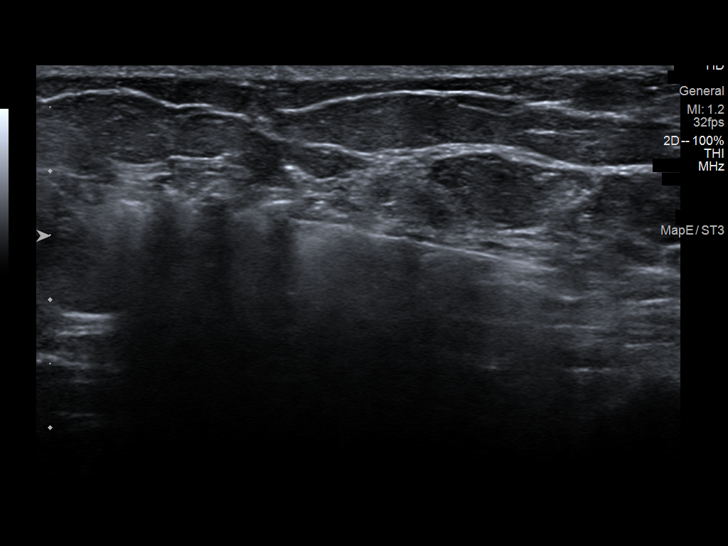
[im 12/13]
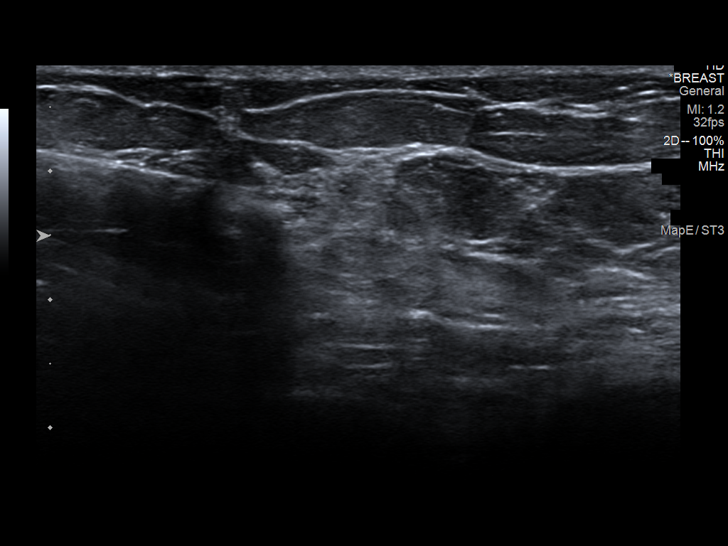
[im 13/13]
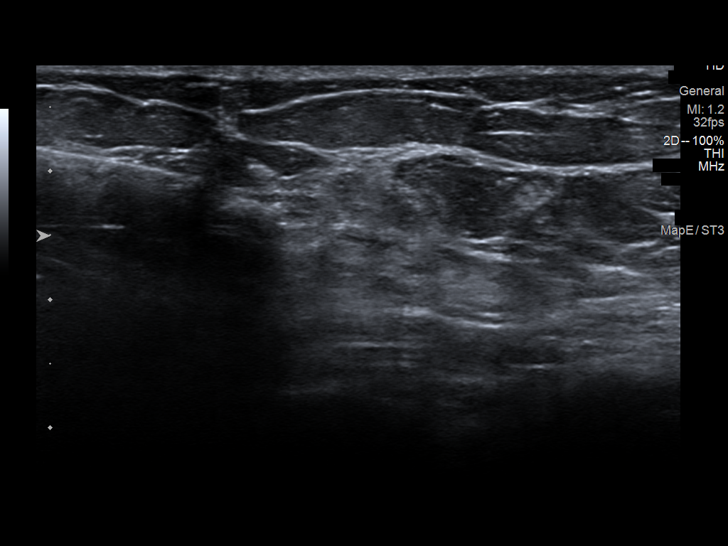

[12 of 13 positions shown; findings below may reference images not displayed]



Lesion quadrant: Upper inner quadrant: Near 9 o'clock, 8 cm from the
nipple.

Using sterile technique and 1% Lidocaine as local anesthetic, under
direct ultrasound visualization, a 14 gauge [place] device was
used to perform biopsy of 6 mm cystic appearing mass medial left
breast using an inferior approach. At the conclusion of the
procedure a ribbon shaped tissue marker clip was deployed into the
biopsy cavity. Follow up 2 view mammogram was performed and dictated
separately.
IMPRESSION: Ultrasound guided biopsy of a small medial left breast mass. No
apparent complications.

ADDENDUM:
Pathology revealed FIBROCYSTIC CHANGES INCLUDING APOCRINE METAPLASIA
of the LEFT breast, 9 o'clock, 5cmfn, (ribbon clip). This was found
to be concordant by Dr. Surinder Pinkston.

Pathology results were discussed with the patient by telephone. The
patient reported doing well after the biopsy with tenderness at the
site. Post biopsy instructions and care were reviewed and questions
were answered. The patient was encouraged to call The [REDACTED]

The patient was instructed to return for annual screening
mammography in April 2022.

Pathology results reported by Ulfur Duca, RN on 05/25/2021.



Lesion quadrant: Upper inner quadrant: Near 9 o'clock, 8 cm from the
nipple.

Using sterile technique and 1% Lidocaine as local anesthetic, under
direct ultrasound visualization, a 14 gauge [place] device was
used to perform biopsy of 6 mm cystic appearing mass medial left
breast using an inferior approach. At the conclusion of the
procedure a ribbon shaped tissue marker clip was deployed into the
biopsy cavity. Follow up 2 view mammogram was performed and dictated
separately.
IMPRESSION: Ultrasound guided biopsy of a small medial left breast mass. No
apparent complications.
